# Patient Record
Sex: Female | Born: 1970 | Race: White | Hispanic: No | Marital: Single | Smoking: Never smoker
Health system: Southern US, Community
[De-identification: ages and names within clinical notes are randomized; demographics above are authoritative.]

## PROBLEM LIST (undated history)

## (undated) DIAGNOSIS — E785 Hyperlipidemia, unspecified: Secondary | ICD-10-CM

## (undated) DIAGNOSIS — J45909 Unspecified asthma, uncomplicated: Secondary | ICD-10-CM

## (undated) DIAGNOSIS — M419 Scoliosis, unspecified: Secondary | ICD-10-CM

## (undated) DIAGNOSIS — G4730 Sleep apnea, unspecified: Secondary | ICD-10-CM

## (undated) DIAGNOSIS — R55 Syncope and collapse: Secondary | ICD-10-CM

## (undated) DIAGNOSIS — R609 Edema, unspecified: Secondary | ICD-10-CM

## (undated) DIAGNOSIS — R945 Abnormal results of liver function studies: Secondary | ICD-10-CM

## (undated) DIAGNOSIS — T7840XA Allergy, unspecified, initial encounter: Secondary | ICD-10-CM

## (undated) DIAGNOSIS — M797 Fibromyalgia: Secondary | ICD-10-CM

## (undated) DIAGNOSIS — R7989 Other specified abnormal findings of blood chemistry: Secondary | ICD-10-CM

## (undated) DIAGNOSIS — E119 Type 2 diabetes mellitus without complications: Secondary | ICD-10-CM

## (undated) DIAGNOSIS — E039 Hypothyroidism, unspecified: Secondary | ICD-10-CM

## (undated) DIAGNOSIS — R112 Nausea with vomiting, unspecified: Secondary | ICD-10-CM

## (undated) DIAGNOSIS — F419 Anxiety disorder, unspecified: Secondary | ICD-10-CM

## (undated) DIAGNOSIS — Z9889 Other specified postprocedural states: Secondary | ICD-10-CM

## (undated) DIAGNOSIS — R51 Headache: Secondary | ICD-10-CM

## (undated) DIAGNOSIS — J40 Bronchitis, not specified as acute or chronic: Secondary | ICD-10-CM

## (undated) DIAGNOSIS — J189 Pneumonia, unspecified organism: Secondary | ICD-10-CM

## (undated) DIAGNOSIS — Z9289 Personal history of other medical treatment: Secondary | ICD-10-CM

## (undated) HISTORY — PX: ABDOMINAL HYSTERECTOMY: SHX81

## (undated) HISTORY — DX: Syncope and collapse: R55

## (undated) HISTORY — DX: Allergy, unspecified, initial encounter: T78.40XA

## (undated) HISTORY — PX: APPENDECTOMY: SHX54

## (undated) HISTORY — PX: MOUTH SURGERY: SHX715

## (undated) HISTORY — DX: Fibromyalgia: M79.7

## (undated) HISTORY — PX: NASAL SINUS SURGERY: SHX719

## (undated) HISTORY — DX: Abnormal results of liver function studies: R94.5

## (undated) HISTORY — DX: Edema, unspecified: R60.9

## (undated) HISTORY — DX: Pneumonia, unspecified organism: J18.9

## (undated) HISTORY — DX: Scoliosis, unspecified: M41.9

## (undated) HISTORY — DX: Other specified abnormal findings of blood chemistry: R79.89

## (undated) HISTORY — DX: Hyperlipidemia, unspecified: E78.5

## (undated) HISTORY — DX: Personal history of other medical treatment: Z92.89

## (undated) HISTORY — DX: Sleep apnea, unspecified: G47.30

---

## 1997-12-27 ENCOUNTER — Other Ambulatory Visit: Admission: RE | Admit: 1997-12-27 | Discharge: 1997-12-27 | Payer: Self-pay | Admitting: Family Medicine

## 1998-01-16 ENCOUNTER — Ambulatory Visit (HOSPITAL_COMMUNITY): Admission: RE | Admit: 1998-01-16 | Discharge: 1998-01-16 | Payer: Self-pay | Admitting: Family Medicine

## 1998-01-17 ENCOUNTER — Ambulatory Visit (HOSPITAL_COMMUNITY): Admission: RE | Admit: 1998-01-17 | Discharge: 1998-01-17 | Payer: Self-pay | Admitting: Family Medicine

## 1998-01-30 ENCOUNTER — Other Ambulatory Visit: Admission: RE | Admit: 1998-01-30 | Discharge: 1998-01-30 | Payer: Self-pay | Admitting: Family Medicine

## 1998-02-05 ENCOUNTER — Ambulatory Visit (HOSPITAL_COMMUNITY): Admission: RE | Admit: 1998-02-05 | Discharge: 1998-02-05 | Payer: Self-pay | Admitting: Family Medicine

## 1998-02-06 ENCOUNTER — Ambulatory Visit (HOSPITAL_COMMUNITY): Admission: RE | Admit: 1998-02-06 | Discharge: 1998-02-06 | Payer: Self-pay | Admitting: Family Medicine

## 1998-04-03 ENCOUNTER — Other Ambulatory Visit: Admission: RE | Admit: 1998-04-03 | Discharge: 1998-04-03 | Payer: Self-pay | Admitting: Family Medicine

## 2000-06-17 ENCOUNTER — Encounter: Payer: Self-pay | Admitting: Family Medicine

## 2000-06-17 ENCOUNTER — Encounter: Admission: RE | Admit: 2000-06-17 | Discharge: 2000-06-17 | Payer: Self-pay | Admitting: Family Medicine

## 2000-09-08 ENCOUNTER — Emergency Department (HOSPITAL_COMMUNITY): Admission: EM | Admit: 2000-09-08 | Discharge: 2000-09-08 | Payer: Self-pay | Admitting: Emergency Medicine

## 2001-03-15 ENCOUNTER — Ambulatory Visit (HOSPITAL_BASED_OUTPATIENT_CLINIC_OR_DEPARTMENT_OTHER): Admission: RE | Admit: 2001-03-15 | Discharge: 2001-03-15 | Payer: Self-pay | Admitting: Oral Surgery

## 2001-03-23 ENCOUNTER — Encounter (INDEPENDENT_AMBULATORY_CARE_PROVIDER_SITE_OTHER): Payer: Self-pay | Admitting: *Deleted

## 2001-03-24 ENCOUNTER — Inpatient Hospital Stay (HOSPITAL_COMMUNITY): Admission: EM | Admit: 2001-03-24 | Discharge: 2001-04-01 | Payer: Self-pay | Admitting: Emergency Medicine

## 2001-03-24 ENCOUNTER — Encounter: Payer: Self-pay | Admitting: Surgery

## 2001-03-26 ENCOUNTER — Encounter: Payer: Self-pay | Admitting: Surgery

## 2001-03-29 ENCOUNTER — Encounter: Payer: Self-pay | Admitting: Surgery

## 2001-04-07 ENCOUNTER — Encounter: Payer: Self-pay | Admitting: Surgery

## 2001-04-07 ENCOUNTER — Encounter: Payer: Self-pay | Admitting: Emergency Medicine

## 2001-04-07 ENCOUNTER — Inpatient Hospital Stay (HOSPITAL_COMMUNITY): Admission: EM | Admit: 2001-04-07 | Discharge: 2001-04-12 | Payer: Self-pay | Admitting: Emergency Medicine

## 2001-04-10 ENCOUNTER — Encounter: Payer: Self-pay | Admitting: Internal Medicine

## 2001-04-12 ENCOUNTER — Encounter: Payer: Self-pay | Admitting: Surgery

## 2002-05-08 ENCOUNTER — Emergency Department (HOSPITAL_COMMUNITY): Admission: EM | Admit: 2002-05-08 | Discharge: 2002-05-08 | Payer: Self-pay

## 2002-05-08 ENCOUNTER — Encounter: Payer: Self-pay | Admitting: Emergency Medicine

## 2002-05-26 ENCOUNTER — Encounter: Admission: RE | Admit: 2002-05-26 | Discharge: 2002-05-26 | Payer: Self-pay | Admitting: Family Medicine

## 2002-05-26 ENCOUNTER — Encounter: Payer: Self-pay | Admitting: Family Medicine

## 2002-06-02 ENCOUNTER — Encounter: Payer: Self-pay | Admitting: Family Medicine

## 2002-06-02 ENCOUNTER — Ambulatory Visit (HOSPITAL_COMMUNITY): Admission: RE | Admit: 2002-06-02 | Discharge: 2002-06-02 | Payer: Self-pay | Admitting: Family Medicine

## 2003-06-11 ENCOUNTER — Ambulatory Visit (HOSPITAL_COMMUNITY): Admission: RE | Admit: 2003-06-11 | Discharge: 2003-06-11 | Payer: Self-pay | Admitting: Otolaryngology

## 2003-06-11 ENCOUNTER — Encounter (INDEPENDENT_AMBULATORY_CARE_PROVIDER_SITE_OTHER): Payer: Self-pay | Admitting: *Deleted

## 2003-06-11 ENCOUNTER — Ambulatory Visit (HOSPITAL_BASED_OUTPATIENT_CLINIC_OR_DEPARTMENT_OTHER): Admission: RE | Admit: 2003-06-11 | Discharge: 2003-06-11 | Payer: Self-pay | Admitting: Otolaryngology

## 2004-02-12 ENCOUNTER — Ambulatory Visit (HOSPITAL_COMMUNITY): Admission: RE | Admit: 2004-02-12 | Discharge: 2004-02-12 | Payer: Self-pay | Admitting: Otolaryngology

## 2004-02-12 ENCOUNTER — Ambulatory Visit (HOSPITAL_BASED_OUTPATIENT_CLINIC_OR_DEPARTMENT_OTHER): Admission: RE | Admit: 2004-02-12 | Discharge: 2004-02-12 | Payer: Self-pay | Admitting: Otolaryngology

## 2004-02-12 ENCOUNTER — Encounter (INDEPENDENT_AMBULATORY_CARE_PROVIDER_SITE_OTHER): Payer: Self-pay | Admitting: Specialist

## 2004-08-05 ENCOUNTER — Other Ambulatory Visit: Admission: RE | Admit: 2004-08-05 | Discharge: 2004-08-05 | Payer: Self-pay | Admitting: Obstetrics and Gynecology

## 2004-08-08 ENCOUNTER — Encounter: Admission: RE | Admit: 2004-08-08 | Discharge: 2004-08-08 | Payer: Self-pay | Admitting: Gastroenterology

## 2004-09-10 ENCOUNTER — Emergency Department (HOSPITAL_COMMUNITY): Admission: EM | Admit: 2004-09-10 | Discharge: 2004-09-10 | Payer: Self-pay | Admitting: Emergency Medicine

## 2004-11-14 ENCOUNTER — Ambulatory Visit (HOSPITAL_COMMUNITY): Admission: RE | Admit: 2004-11-14 | Discharge: 2004-11-14 | Payer: Self-pay | Admitting: Urology

## 2005-03-02 ENCOUNTER — Inpatient Hospital Stay (HOSPITAL_BASED_OUTPATIENT_CLINIC_OR_DEPARTMENT_OTHER): Admission: RE | Admit: 2005-03-02 | Discharge: 2005-03-02 | Payer: Self-pay | Admitting: Cardiology

## 2005-09-18 ENCOUNTER — Other Ambulatory Visit: Admission: RE | Admit: 2005-09-18 | Discharge: 2005-09-18 | Payer: Self-pay | Admitting: Obstetrics and Gynecology

## 2006-03-30 ENCOUNTER — Encounter: Admission: RE | Admit: 2006-03-30 | Discharge: 2006-03-30 | Payer: Self-pay | Admitting: Obstetrics and Gynecology

## 2006-04-06 ENCOUNTER — Encounter: Admission: RE | Admit: 2006-04-06 | Discharge: 2006-04-06 | Payer: Self-pay | Admitting: Obstetrics and Gynecology

## 2006-09-27 ENCOUNTER — Encounter: Admission: RE | Admit: 2006-09-27 | Discharge: 2006-09-27 | Payer: Self-pay | Admitting: Obstetrics and Gynecology

## 2007-04-04 ENCOUNTER — Encounter: Admission: RE | Admit: 2007-04-04 | Discharge: 2007-04-04 | Payer: Self-pay | Admitting: Obstetrics and Gynecology

## 2007-11-08 ENCOUNTER — Encounter: Admission: RE | Admit: 2007-11-08 | Discharge: 2007-11-08 | Payer: Self-pay | Admitting: Obstetrics and Gynecology

## 2008-04-05 ENCOUNTER — Other Ambulatory Visit: Admission: RE | Admit: 2008-04-05 | Discharge: 2008-04-05 | Payer: Self-pay | Admitting: Obstetrics & Gynecology

## 2008-04-16 ENCOUNTER — Other Ambulatory Visit: Admission: RE | Admit: 2008-04-16 | Discharge: 2008-04-16 | Payer: Self-pay | Admitting: Obstetrics and Gynecology

## 2009-05-14 ENCOUNTER — Emergency Department (HOSPITAL_COMMUNITY): Admission: EM | Admit: 2009-05-14 | Discharge: 2009-05-15 | Payer: Self-pay | Admitting: Emergency Medicine

## 2009-08-06 ENCOUNTER — Encounter: Admission: RE | Admit: 2009-08-06 | Discharge: 2009-08-06 | Payer: Self-pay | Admitting: Family Medicine

## 2009-08-07 ENCOUNTER — Emergency Department (HOSPITAL_COMMUNITY): Admission: EM | Admit: 2009-08-07 | Discharge: 2009-08-07 | Payer: Self-pay | Admitting: Emergency Medicine

## 2009-08-14 ENCOUNTER — Ambulatory Visit: Payer: Self-pay | Admitting: Vascular Surgery

## 2009-09-10 ENCOUNTER — Emergency Department (HOSPITAL_COMMUNITY): Admission: EM | Admit: 2009-09-10 | Discharge: 2009-09-10 | Payer: Self-pay | Admitting: Emergency Medicine

## 2010-08-25 ENCOUNTER — Emergency Department (HOSPITAL_COMMUNITY)
Admission: EM | Admit: 2010-08-25 | Discharge: 2010-08-25 | Payer: Self-pay | Source: Home / Self Care | Admitting: Emergency Medicine

## 2010-09-27 ENCOUNTER — Other Ambulatory Visit: Payer: Self-pay | Admitting: Obstetrics & Gynecology

## 2010-09-27 DIAGNOSIS — Z1239 Encounter for other screening for malignant neoplasm of breast: Secondary | ICD-10-CM

## 2010-09-28 ENCOUNTER — Encounter: Payer: Self-pay | Admitting: Obstetrics and Gynecology

## 2010-09-28 ENCOUNTER — Encounter: Payer: Self-pay | Admitting: Family Medicine

## 2010-11-06 ENCOUNTER — Ambulatory Visit
Admission: RE | Admit: 2010-11-06 | Discharge: 2010-11-06 | Disposition: A | Discharge status: Home / Self Care | Payer: BC Managed Care – PPO | Source: Ambulatory Visit | Attending: Obstetrics & Gynecology | Admitting: Obstetrics & Gynecology

## 2010-11-06 DIAGNOSIS — Z1239 Encounter for other screening for malignant neoplasm of breast: Secondary | ICD-10-CM

## 2010-12-09 LAB — POCT I-STAT, CHEM 8
BUN: 12 mg/dL (ref 6–23)
Calcium, Ion: 1.22 mmol/L (ref 1.12–1.32)
Glucose, Bld: 134 mg/dL — ABNORMAL HIGH (ref 70–99)
HCT: 43 % (ref 36.0–46.0)
Hemoglobin: 14.6 g/dL (ref 12.0–15.0)
Potassium: 4.1 mEq/L (ref 3.5–5.1)
TCO2: 26 mmol/L (ref 0–100)

## 2010-12-12 LAB — D-DIMER, QUANTITATIVE: D-Dimer, Quant: <0.22 ug/mL-FEU (ref 0.00–0.48)

## 2010-12-25 ENCOUNTER — Other Ambulatory Visit: Payer: Self-pay | Admitting: Gastroenterology

## 2010-12-29 ENCOUNTER — Ambulatory Visit
Admission: RE | Admit: 2010-12-29 | Discharge: 2010-12-29 | Disposition: A | Discharge status: Home / Self Care | Payer: BC Managed Care – PPO | Source: Ambulatory Visit | Attending: Gastroenterology | Admitting: Gastroenterology

## 2011-01-01 ENCOUNTER — Emergency Department (HOSPITAL_COMMUNITY)
Admission: EM | Admit: 2011-01-01 | Discharge: 2011-01-01 | Disposition: A | Discharge status: Home / Self Care | Payer: BC Managed Care – PPO | Attending: Emergency Medicine | Admitting: Emergency Medicine

## 2011-01-01 ENCOUNTER — Emergency Department (HOSPITAL_COMMUNITY): Payer: BC Managed Care – PPO

## 2011-01-01 DIAGNOSIS — R748 Abnormal levels of other serum enzymes: Secondary | ICD-10-CM | POA: Insufficient documentation

## 2011-01-01 DIAGNOSIS — D259 Leiomyoma of uterus, unspecified: Secondary | ICD-10-CM | POA: Insufficient documentation

## 2011-01-01 DIAGNOSIS — R1011 Right upper quadrant pain: Secondary | ICD-10-CM | POA: Insufficient documentation

## 2011-01-01 LAB — COMPREHENSIVE METABOLIC PANEL
ALT: 52 U/L — ABNORMAL HIGH (ref 0–35)
Albumin: 4.1 g/dL (ref 3.5–5.2)
Alkaline Phosphatase: 72 U/L (ref 39–117)
Chloride: 105 mEq/L (ref 96–112)
Creatinine, Ser: 0.75 mg/dL (ref 0.4–1.2)
GFR calc non Af Amer: >60 mL/min (ref >60)
Glucose, Bld: 103 mg/dL — ABNORMAL HIGH (ref 70–99)
Potassium: 3.7 mEq/L (ref 3.5–5.1)
Total Bilirubin: 1.3 mg/dL — ABNORMAL HIGH (ref 0.3–1.2)
Total Protein: 7.4 g/dL (ref 6.0–8.3)

## 2011-01-01 LAB — URINALYSIS, ROUTINE W REFLEX MICROSCOPIC
Bilirubin Urine: NEGATIVE
Glucose, UA: NEGATIVE mg/dL
Hgb urine dipstick: NEGATIVE
Ketones, ur: NEGATIVE mg/dL
Nitrite: NEGATIVE
Protein, ur: NEGATIVE mg/dL
Specific Gravity, Urine: 1.023 (ref 1.005–1.030)

## 2011-01-01 LAB — DIFFERENTIAL
Basophils Absolute: 0 10*3/uL (ref 0.0–0.1)
Eosinophils Absolute: 0.1 10*3/uL (ref 0.0–0.7)
Monocytes Relative: 5 % (ref 3–12)
Neutro Abs: 3.6 10*3/uL (ref 1.7–7.7)

## 2011-01-01 LAB — CBC
Hemoglobin: 12.5 g/dL (ref 12.0–15.0)
MCH: 31.9 pg (ref 26.0–34.0)
Platelets: 250 10*3/uL (ref 150–400)
RBC: 3.92 MIL/uL (ref 3.87–5.11)

## 2011-01-01 LAB — POCT PREGNANCY, URINE: Preg Test, Ur: NEGATIVE

## 2011-01-01 MED ORDER — IOHEXOL 300 MG/ML  SOLN
100.0000 mL | Freq: Once | INTRAMUSCULAR | Status: AC | PRN
  Administered 2011-01-01: 100 mL via INTRAVENOUS

## 2011-01-09 ENCOUNTER — Other Ambulatory Visit (HOSPITAL_COMMUNITY): Payer: Self-pay | Admitting: Gastroenterology

## 2011-01-12 ENCOUNTER — Emergency Department (HOSPITAL_COMMUNITY)
Admission: EM | Admit: 2011-01-12 | Discharge: 2011-01-12 | Disposition: A | Discharge status: Home / Self Care | Payer: BC Managed Care – PPO | Attending: Emergency Medicine | Admitting: Emergency Medicine

## 2011-01-12 ENCOUNTER — Emergency Department (HOSPITAL_COMMUNITY): Payer: BC Managed Care – PPO

## 2011-01-12 DIAGNOSIS — R112 Nausea with vomiting, unspecified: Secondary | ICD-10-CM | POA: Insufficient documentation

## 2011-01-12 DIAGNOSIS — N390 Urinary tract infection, site not specified: Secondary | ICD-10-CM | POA: Insufficient documentation

## 2011-01-12 DIAGNOSIS — R0602 Shortness of breath: Secondary | ICD-10-CM | POA: Insufficient documentation

## 2011-01-12 DIAGNOSIS — R109 Unspecified abdominal pain: Secondary | ICD-10-CM | POA: Insufficient documentation

## 2011-01-12 LAB — URINALYSIS, ROUTINE W REFLEX MICROSCOPIC
Bilirubin Urine: NEGATIVE
Protein, ur: NEGATIVE mg/dL
Urobilinogen, UA: 0.2 mg/dL (ref 0.0–1.0)
pH: 6 (ref 5.0–8.0)

## 2011-01-12 LAB — DIFFERENTIAL
Basophils Absolute: 0 10*3/uL (ref 0.0–0.1)
Lymphocytes Relative: 29 % (ref 12–46)
Lymphs Abs: 2 10*3/uL (ref 0.7–4.0)
Monocytes Absolute: 0.3 10*3/uL (ref 0.1–1.0)
Monocytes Relative: 5 % (ref 3–12)
Neutro Abs: 4.5 10*3/uL (ref 1.7–7.7)

## 2011-01-12 LAB — COMPREHENSIVE METABOLIC PANEL
Alkaline Phosphatase: 72 U/L (ref 39–117)
BUN: 11 mg/dL (ref 6–23)
Chloride: 100 mEq/L (ref 96–112)
Glucose, Bld: 77 mg/dL (ref 70–99)
Potassium: 3.5 mEq/L (ref 3.5–5.1)
Total Bilirubin: 1.5 mg/dL — ABNORMAL HIGH (ref 0.3–1.2)
Total Protein: 7.3 g/dL (ref 6.0–8.3)

## 2011-01-12 LAB — CBC
HCT: 36 % (ref 36.0–46.0)
Hemoglobin: 13 g/dL (ref 12.0–15.0)
MCHC: 36.1 g/dL — ABNORMAL HIGH (ref 30.0–36.0)
MCV: 90.5 fL (ref 78.0–100.0)

## 2011-01-12 LAB — POCT I-STAT, CHEM 8
Calcium, Ion: 1.05 mmol/L — ABNORMAL LOW (ref 1.12–1.32)
Creatinine, Ser: 0.8 mg/dL (ref 0.4–1.2)
Glucose, Bld: 77 mg/dL (ref 70–99)
Hemoglobin: 11.9 g/dL — ABNORMAL LOW (ref 12.0–15.0)
Potassium: 3.5 mEq/L (ref 3.5–5.1)

## 2011-01-12 LAB — LIPASE, BLOOD: Lipase: 12 U/L (ref 11–59)

## 2011-01-12 LAB — RAPID URINE DRUG SCREEN, HOSP PERFORMED: Barbiturates: NOT DETECTED

## 2011-01-14 LAB — URINE CULTURE: Culture  Setup Time: 201205080250

## 2011-01-20 ENCOUNTER — Encounter (HOSPITAL_COMMUNITY)
Admission: RE | Admit: 2011-01-20 | Discharge: 2011-01-20 | Disposition: A | Discharge status: Home / Self Care | Payer: BC Managed Care – PPO | Source: Ambulatory Visit | Attending: Gastroenterology | Admitting: Gastroenterology

## 2011-01-20 ENCOUNTER — Other Ambulatory Visit (HOSPITAL_COMMUNITY): Payer: Self-pay | Admitting: Gastroenterology

## 2011-01-20 DIAGNOSIS — R1011 Right upper quadrant pain: Secondary | ICD-10-CM

## 2011-01-20 DIAGNOSIS — K3189 Other diseases of stomach and duodenum: Secondary | ICD-10-CM | POA: Insufficient documentation

## 2011-01-20 DIAGNOSIS — R1013 Epigastric pain: Secondary | ICD-10-CM | POA: Insufficient documentation

## 2011-01-20 MED ORDER — TECHNETIUM TC 99M SULFUR COLLOID
2.0000 | Freq: Once | INTRAVENOUS | Status: AC | PRN
  Administered 2011-01-20: 2 via INTRAVENOUS

## 2011-01-20 NOTE — Procedures (Signed)
CAROTID DUPLEX EXAM   INDICATION:  Carotid pain.   HISTORY:  Diabetes:  No.  Cardiac:  No.  Hypertension:  No.  Smoking:  No.  Previous Surgery:  No.  CV History:  No.  Amaurosis Fugax No, Paresthesias No, Hemiparesis No.                                       RIGHT             LEFT  Brachial systolic pressure:         120               100  Brachial Doppler waveforms:         Within normal limits                Within normal limits  Vertebral direction of flow:        Antegrade         Antegrade  DUPLEX VELOCITIES (cm/sec)  CCA peak systolic                   119               144  ECA peak systolic                   104               131  ICA peak systolic                   90                106  ICA end diastolic                   41                33  PLAQUE MORPHOLOGY:                  Heterogenous      Heterogenous  PLAQUE AMOUNT:                      Mild              Mild  PLAQUE LOCATION:                    ECA               ICA, ECA    IMPRESSION:  1. The right and left internal carotid arteries suggest 20-39%      stenosis.  2. Bilateral vertebrals appear antegrade.   ___________________________________________  Janetta Hora. Fields, MD   CB/MEDQ  D:  08/14/2009  T:  08/14/2009  Job:  [id number]

## 2011-01-23 NOTE — Discharge Summary (Signed)
[facility]  Patient:    Destiny Sharp, Destiny Sharp                      MRN: [id number] Adm. Date:  [id number] Disc. Date: [id number] Attending:  Anastasio Auerbach CC:         Tammy R. Collins Scotland, M.D.  Currie Paris, M.D.  Jodi Marble. Fredia Sorrow, M.D.   Discharge Summary  DATE OF BIRTH:  2071/03/30  DISCHARGE DIAGNOSES:  1. Left lower lobe pneumonia with large effusion.     a. Therapeutic/diagnostic thoracentesis, April 07, 2001.     b. 9800 white blood cells in fluid, culture negative.  2. Hypoxia secondary to left lower lobe pneumonia, resolved.  3. Persistent atelectasis.     a. Recent abdominal surgery.  4. Ruptured appendix on March 24, 2001.     a. Postoperative atelectasis with hypoxia.  5. Mild elevated liver tests, resolved.  6. Normocytic anemia.     a. Iron deficiency:  Iron 27, total iron binding capacity 211, percent        saturation 13.  7. Back pain, resolved.  8. Transient diarrhea.     a. Clostridium difficile toxin negative.  9. History of cardiogenic syncope. 10. History of migraine headaches. 11. History of hyperlipidemia.  ALLERGIES:  CODEINE.  DISCHARGE MEDICATIONS: 1. (New) Tequin 400 mg q.d. through April 21, 2001 (total 14-day therapy). 2. (New) Flagyl 500 mg one p.o. t.i.d. through April 21, 2001. 3. (New) Humibid LA 600 mg two p.o. b.i.d. through April 21, 2001. 4. (New) Albuterol MDI with spacer two puffs q.i.d. through April 21, 2001. 5. Nadolol 20 mg daily. 6. Depakote as before if needed for migraines. 7. (New) Percocet half to one tablet q.6h. p.r.n. severe pain, otherwise use    Tylenol, dispense #10 with no refills.  CONDITION UPON DISCHARGE:  Stable.  DISPOSITION:  Home with family.  RECOMMENDED ACTIVITY:  As tolerated.  I have advised the patient not to return to work until she follows up with Currie Paris, M.D., on April 20, 2001.  I forgot to write her this note at the time of discharge.  In follow-up with  Tammy R. Collins Scotland, M.D., maybe she would be willing to write for that.  If not, I will be happy to leave one on the floor and she can return for it.  RECOMMENDED DIET:  As tolerated.  Small frequent meals.  SPECIAL INSTRUCTIONS:  Return if symptoms recur.  FOLLOW-UP: 1. Tammy R. Collins Scotland, M.D., on Thursday, April 14, 2001, at 4:10 p.m.  Consider    getting baseline outpatient x-ray.  Also if possible, write her an excuse    for work as I forgot to do that prior to her discharge.  Would recommend    repeat x-ray in approximately four to six weeks to ensure clearing in    follow-up. 2. Janine Limbo, M.D., on Wednesday, April 20, 2001, at 2:30 p.m.  She    was originally scheduled to be seen on April 15, 2001, but I feel like we    should bump that back given her hospital stay.  CONSULTANTS: 1. Anastasio Auerbach, M.D.  Currie Paris, M.D., originally admitted this    patient and was consulting.  Service transfer was arranged during the    admission. 2. Sherrine Maples T. Fredia Sorrow, M.D.  PROCEDURES: 1. Left ultrasound-guided thoracentesis:  750 ml of fluid removed.  Studies    showed LDH 387, glucose 95, WBC 9800 with 76% segs,  pH 7.37, and cultures    negative. 2. Serial chest x-rays:  Initially she had a large left pleural effusion.    This resolved post thoracentesis.  I followed x-rays up until April 09, 2001.  I did repeat one at the time of discharge which was read as possibly    some slight pulmonary edema with persistent atelectasis.  The effusion had    not returned.  I reviewed with the radiologist and he felt like there was    some potentially improved clearing at the left base because we could see    the diaphragm.  He also questioned a small pneumothorax likely related to    the tap.  This was not seen on the previous films, but she was not    inspiring as well on those.  She was clinically stable as detailed below.  HOSPITAL COURSE: #1 - LEFT LOWER LOBE PNEUMONIA WITH LARGE  EFFUSION:  Destiny Sharp is a 40 year old Caucasian female who was hospitalized on March 24, 2001, with a ruptured appendix.  Prior to that, she had been fairly healthy.  She underwent emergency surgery and postoperatively had some significant atelectasis resulting in hypoxia.  At the time of discharge from that acute hospital stay, her saturations were normal on room air.  She was home for approximately one week and returned to the emergency room with marked shortness of breath.  Over that time, she was actually on Augmentin status post appendix rupture.  In the emergency room, she was noted to have a large left effusion.  Because she was markedly dyspneic, Currie Paris, M.D., admitted her to the step-down unit.  The following morning, she underwent ultrasound-guided thoracentesis. There were 750 ml of fluid removed and her symptoms were markedly improved.  I changed her antibiotics to Zosyn given the recent hospitalization.  Her temperature on admission was 102 degrees and she quickly defervesced. Twenty-four hours prior to discharge, I did place her on oral Tequin and Flagyl.  I did not use Augmentin for the simple fact that she had been on this previously and still developed the pneumonia.  During her stay, we had difficulty getting her to deep breathe and use her nebulizers or metered dose inhalers.  She was getting better with incentive spirometry and I think the atelectasis was improving and certainly her oxygenation was.  I sent her home with a flutter valve.  Again, as I stated above, I did review the discharge chest x-ray.  I was concerned that maybe it had not improved and maybe had worsened slightly, but certainly her clinical picture was improved and I felt comfort sending her home.  She will have close follow-up with her primary physician, Tammy R. Collins Scotland, M.D., and she knows to return to the emergency room  if she has problems.  She will complete a total of 14 days of  antibiotic therapy.  I did feel like she should be out of work for at least a week and a half following this pneumonia for recovery.  #2 - IRON DEFICIENCY ANEMIA:  Destiny Sharp had a discharge hemoglobin after her surgery of 9.1.  It has remained relatively stable.  I did do iron studies which did detect a deficiency.  Her reticulocyte count is inappropriately low. I did not place her on iron at the time of discharge because she was having difficulty with nausea with the antibiotics.  Once the antibiotics are complete, I would consider putting her on iron therapy and  following her hemoglobin.  #3 - RECENT APPENDECTOMY:  Destiny Sharp had a ruptured appendix and underwent surgery as I mentioned above.  She was asking me how long she should stay out of work.  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  I have offered to keep her out of work through Wednesday, April 20, 2001.  After that I would defer to Dr. Eligha Bridegroom opinion about her ability to drive the bus based on the surgical intervention.  The patient understands this and will follow up with him at the new scheduled appointment that I made for her.  DISCHARGE LABORATORY DATA:  Hemoglobin 8.3-9.0, WBC 4300, platelet count 5111. Sodium 139, potassium 3.4, chloride 105, bicarbonate 28, BUN 9, creatinine 0.8, glucose 108, total bilirubin 0.9, alkaline phosphatase 182, SGOT 47, SGPT 91, albumin 2.4.  I spent greater than 30 minutes arranging discharge and dictating. DD:  04/12/01 TD:  04/12/01 Job: [id number]

## 2011-01-23 NOTE — Op Note (Signed)
Destiny Sharp, Destiny Sharp                         ACCOUNT NO.:  [id number]   MEDICAL RECORD NO.:  [id number]                   PATIENT TYPE:  AMB   LOCATION:  [facility]                                  FACILITY:  [facility]   PHYSICIAN:  Kinnie Scales. Annalee Genta, M.D.            DATE OF BIRTH:  1971-08-31   DATE OF PROCEDURE:  06/11/2003  DATE OF DISCHARGE:                                 OPERATIVE REPORT   PREOPERATIVE DIAGNOSES:  1. Chronic sinusitis.  2. Nasal septal deviation with nasal airway obstruction.  3. Bilateral inferior turbinate hypertrophy.   POSTOPERATIVE DIAGNOSES:  1. Chronic sinusitis.  2. Nasal septal deviation with nasal airway obstruction.  3. Bilateral inferior turbinate hypertrophy.   INDICATION FOR PROCEDURE:  1. Chronic sinusitis.  2. Nasal septal deviation with nasal airway obstruction.  3. Bilateral inferior turbinate hypertrophy.   SURGICAL PROCEDURES:  1. Bilateral endoscopic sinus surgery with InstaTrak computer-assisted     navigation, sinus surgery consisting of bilateral nasofrontal recess     exploration, total ethmoidectomy, and maxillary antrostomy, with removal     of diseased maxillary sinus tissue.  2. Nasal septoplasty.  3. Bilateral inferior turbinate intramural cautery.   ANESTHESIA:  General endotracheal.   SURGEON:  Kinnie Scales. Annalee Genta, M.D.   COMPLICATIONS:  None.   ESTIMATED BLOOD LOSS:  Approximately 150 mL.   There were no complications.  The patient was transferred from the operating  room to the recovery room in stable condition.   BRIEF HISTORY:  The patient is a 40 year old white female who was referred  by her primary care physician, Tammy R. Collins Scotland, M.D., for evaluation of  chronic sinusitis.  The patient had been treated with numerous courses of  antibiotics and prior CT scanning showed significant opacification of the  maxillary and ethmoid sinuses bilaterally.  The patient complained of severe  headaches, sino-nasal discharge,  and congestion and pressure.  She was found  to have a severely deviated nasal septum, turbinate hypertrophy, and  opacification of the maxillary and frontal sinuses.  She was treated with  several additional courses of antibiotics and despite this therapy, failed  to resolve her ongoing infections.  A CT scan with the InstaTrak was  obtained prior to surgical counseling and the risks, benefits, and possible  complications of the above surgical procedures were discussed in detail with  the patient and her family, who understood and concurred with our plan for  surgery, which is scheduled for June 11, 2003.   OPERATIVE PROCEDURE:  The patient was brought to the operating room on  June 11, 2003, and placed in the supine position on the operating table.  General endotracheal anesthesia was established without difficulty and the  patient was adequately anesthetized.  Her nose was injected with 12 mL of 1%  lidocaine and 1:100,000 solution of epinephrine injected in a submucosal  fashion along the nasal septum, inferior turbinates, lateral nasal wall, and  middle turbinates bilaterally.  The patient's nose was then packed with  Afrin-soaked cottonoid pledgets, which were left in place for approximately  10 minutes to allow for vasoconstriction.  The surgical procedure was begun  with removal of the pledgets and bilateral nasal endoscopy.  Prior to the  beginning of the procedure, the InstaTrak computer-assisted navigation  headgear was placed and anatomic and surgical landmarks were identified and  confirmed.  The patient was prepped and draped in a sterile fashion.  With  the 0 degree telescope, bilateral endoscopy was performed.  On the patient's  right-hand side the nasal cavity was relatively patent.  She had turbinate  hypertrophy and moderate mucosal edema.  The middle meatus showed thick  mucopurulent material and polypoid changes with obstruction.  On the  patient's left-hand side  the patient had a severely deviated nasal septum  with septal spurring and impingement on the middle meatus and lateral nasal  wall.  I was unable to visualize the middle meatus.  The surgical procedure  was then undertaken with right endoscopic sinus surgery.  By using a 0  degree telescope, the patient's middle turbinate was gently medialized.  Polypoid material within the middle meatus was then resected with a straight  microdebrider.  The uncinate process was reflected medially and was resected  in its entirety from inferior to superior.  Dissection was then carried out  through the anterior ethmoid region, removing the ethmoid bulla and diseased  mucosa and purulent soft tissue.  Dissection was carried out from anterior  to posterior along the floor of the ethmoid sinus, removing bony septations.  The posterior aspects of the ethmoid sinus were identified and the roof of  the ethmoid sinus was confirmed with the InstaTrak.  Dissection was then  carried from posterior to anterior along the roof of the ethmoid using a 30  degree telescope, a curved microdebrider, and through-cutting forceps.  The  nasofrontal recess region was identified.  It was completely occluded with  diseased mucosa and polypoid material.  Using a through-cutting frontal  sinus punch and curved microdebrider, the natural ostium of the frontal  sinus was identified and enlarged, creating a widely patent nasofrontal  recess.  Thick mucopurulent material was then suctioned from the frontal  sinus.  The underlying mucosa appeared to be intact and the nasofrontal  recess was widely patent at the conclusion of the procedure.  Then using a  30 degree telescope, the natural ostium of the maxillary sinus was  identified and this was completely occluded with diseased mucosa, which was  resected using through-cutting forceps and a curved microdebrider.  The  right maxillary sinus was filled with thick, purulent material and  polypoid mucosa, which was resected using a curved microdebrider.  All specimens were  sent to pathology for gross and microscopic evaluation.   A right hemitransfixion incision was created through the mucosa and  underlying submucosa and a mucoperichondrial flap was elevated from anterior  to posterior along the patient's right-hand side.  The bony-cartilaginous  junction was crossed in the midline and a mucoperiosteal flap was elevated  along the patient's left-hand side.  Severely deviated bone and cartilage  from the mid- and posterior aspects of the nasal septum were removed using a  through-cutting forceps and a 4 mm osteotome with the septum brought to the  midline.  Previously-removed nasal septal cartilage was morcellized and  returned to the mucoperichondrial pocket and the mucoperichondrial flaps  reapproximated with a 4-0 gut suture on  a Keith needle in a horizontal  mattressing fashion.  At the conclusion of the surgical procedure bilateral  Doyle nasal sepal splints were sutured in position with a 3-0 Ethilon  suture.   Attention was then turned to the patient's left-hand side, where endoscopic  sinus surgery was undertaken.  The middle turbinate was medialized.  The  uncinate process was resected with a straight microdebrider.  The dissection  was carried out through the anterior ethmoid air cells, resecting the  ethmoid bulla and removing diseased mucosa and polypoid material under  direct visualization using a 0 degree telescope.  Posterior ethmoid air  cells were identified and the roof of the ethmoid sinus was confirmed with  the InstaTrak.  Dissection was carried along the roof of the ethmoid sinus  from posterior to anterior, removing diseased mucosa and significant bony  septation.  The natural ostium of the frontal sinus was identified, and this  was occluded with thickened, diseased mucosa.  Purulent material was  suctioned from the frontal sinus and the natural  ostium was enlarged with  resection of underlying ethmoid air cells.  Dissection was carried  anteriorly to create a widely patent nasofrontal recess.  Attention was then  turned to the lateral nasal wall, where residual uncinate processes were  resected and the natural ostium of the maxillary sinus was identified.  Heavy diseased mucosa was encountered, and this was resected using the  through-cutting forceps and the microdebrider.  The left maxillary sinus was  completely opacified with diseased polypoid mucosa and purulent material,  which was suctioned and debrided.   Bilateral intramural inferior turbinate cautery was undertaken using bipolar  cautery forceps.  Cautery was carried out in a submucosal fashion with two  passes made in each inferior turbinate.  When the turbinates had been  adequately cauterized, they were outfractured to create a more patent nasal  cavity.  The patient's nasal cavity was then irrigated and suctioned.  Using a 0 degree telescope, surgical debris was resected from the sinus cavities  bilaterally.  A 50-50 mix of Bactroban ointment and Kenalog 40 mg were then  instilled using a curved forceps in the frontal and maxillary sinuses  bilaterally.  Bilateral Kennedy anterior nasal sinus packs were placed  within the middle meatus and hydrated with sterile saline.  The patient's  nasal cavity and nasopharynx were then irrigated and suctioned, the oral  cavity and oropharynx were suctioned, and an orogastric tube was passed.  The patient was then awakened from her anesthetic.  She was extubated and  was transferred from the operating room to the recovery room in stable  condition.  There were no complications.  Estimated blood loss approximately  150 mL.                                                Kinnie Scales. Annalee Genta, M.D.    DLS/MEDQ  D:  45/40/9811  T:  06/12/2003  Job:  [id number]

## 2011-01-23 NOTE — Consult Note (Signed)
[facility]  Patient:    Destiny Sharp, Destiny Sharp                      MRN: [id number] Proc. Date: 03/23/01 Adm. Date:  [id number] Attending:  Charlton Haws CC:         Tammy R. Collins Scotland, M.D.   Consultation Report  CHIEF COMPLAINT:  Abdominal pain.  CLINICAL HISTORY:  Ms. Proffit is a 39 year old woman who presented to the emergency room having seen Dr. Herb Grays earlier today.  She had developed fairly severe abdominal pain yesterday which was all through her abdomen, but now localized more in the lower half of her abdomen, possibly a little bit more on the right than the left but across the entire lower abdomen.  She had a low grade fever yesterday and apparently had a 102 fever earlier today.  She had a similar episode in January, but was told that was constipation.  She has had nausea and vomiting some today.  She has felt constipated again and thought maybe this was the same thing.  The patient has had no recent urinary tract symptoms.  She is late for her menstrual period.  She had a little bit of spotting.  She is normally fairly regular.  She has never been sexually active.  PAST SURGICAL HISTORY:  She recently has had some oral surgery, has been taking pain medicine for that.  Other than that, negative.  MEDICATIONS:  Depakote for migraines, nadolol for vasovagal reactions.  For her surgery she has been on some ibuprofen, cephalexin, and Darvocet.  ALLERGIES:  CODEINE does cause some nausea.  SOCIAL HISTORY:  She does not smoke, drink, or use drugs.  FAMILY HISTORY:  Positive with a mother that has had cancer.  PHYSICAL EXAMINATION  GENERAL:  Patient is alert, oriented, appears a little uncomfortable.  VITAL SIGNS:  Temperature 99 earlier, 102 more recently.  HEENT:  Head is normocephalic.  Eyes:  Non-icteric.  Pupils are round and regular.  NECK:  Supple.  No masses or thyromegaly.  LUNGS:  Clear to auscultation.  HEART:   Regular.  No murmurs, rubs, or gallops that I can hear, although there is a questionable history of a heart murmur.  ABDOMEN:  Distended.  Fairly tender throughout, but more tender in the lower than the upper.  However, she does not really have a lot of guarding and what she does seems to be more right lower quadrant than left lower quadrant but she is tender across both quadrants.  I did not hear any bowel sounds, although bowel sounds apparently were heard by the EDP earlier.  EXTREMITIES:  No cyanosis, edema.  PELVIC:  By the EDP showed some adnexal tenderness on bimanual examination. Her hymen was noted to be intact.  RECTAL:  Unremarkable by the EDP and not repeated by me.  LABORATORIES:  Slight elevation of SGPT at 43, slight elevation total bilirubin 1.3, normal electrolytes.  White count slightly elevated at 12,000, hemoglobin 12.2.  Urine pregnancy test was negative.  Urinalysis showed specific gravity 10.2, but otherwise negative except for trace ketones.  The patient is currently drinking, at the time I saw her, contrast for her CT and was approximately a third to a half of the way through the contrast.  IMPRESSION:  Acute abdominal pain of uncertain etiology.  The examination is suspicious for a perforated appendix, although her onset of pain as being fairly sudden is somewhat atypical.  She has already started  receiving some intravenous fluid hydration and as noted pending CT scan.  Will make a final decision about this once the CT is done but at this point clinically I suspect she will need to go to the operating room. DD:  03/23/01 TD:  03/24/01 Job: [id number]

## 2011-01-23 NOTE — Consult Note (Signed)
[facility]  Patient:    Destiny Sharp, Destiny Sharp                      MRN: [id number] Proc. Date: 04/07/01 Adm. Date:  [id number] Attending:  Sandi Raveling CC:         Tammy R. Collins Scotland, M.D.  Currie Paris, M.D.   Consultation Report  DATE OF BIRTH:  February ______, 1972  REFERRING PHYSICIAN:  Dr. Currie Paris.  REASON FOR CONSULTATION:  Pleural effusion, dyspnea.  IMPRESSION: 1. Probable left lower lobe pneumonia. 2. Large left pleural effusion. 3. Hypoxia secondary to above. 4. Normocytic anemia.    a. Hemoglobin 9.5, MCV 89.    b. April 01, 2001 hemoglobin 9.1. 5. Mildly elevated liver function tests.    a. Alkaline phosphatase 200, SGOT 46, SGPT 48. 6. Ruptured appendix, March 24, 2001.    a. Status post surgery.    b. Postoperative atelectasis and hypoxia. 7. History of cardiogenic syncope. 8. History of migraine headaches. 9. Hyperlipidemia.  RECOMMENDATIONS: 1. Agree with ultrasound-guided thoracentesis (standard fluid studies). 2. Change Rocephin to Zosyn, given recent hospitalization. 3. Incentive spirometry. 4. Advance diet. 5. Transition from PCA to oral pain medications (back pain secondary to    pneumonia and effusion).  DISCUSSION:  Ms. Destiny Sharp is a 40 year old Caucasian female who was admitted on July 17th with a ruptured appendix.  Her postop course was complicated by atelectasis and hypoxia, but she was ultimately discharged home in stable condition on April 01, 2001.  After a few days at home, she began noticing increasing fevers as well as cough and shortness of breath.  She presents to the emergency room now with significant dyspnea and is noted to have a large left pleural effusion and be hypoxic on room air with a pH of 7.46, PCO2 31, PO2 55.  Initial studies in the emergency room revealed a chest x-ray showing a large left pleural effusion.  Spiral CT was done to rule out pulmonary embolus.  There was no  evidence of pulmonary embolus but she did have compressive atelectasis on the left with a large pleural effusion.  She was admitted to the intensive care unit and placed on 4 L of oxygen, which brought her PO2 up to 89.  She is complaining of pain in the mid-to-lower back region. She describes it as sharp and it is worse with inspiration or movement.  She has had no lower extremity swelling or edema.  No history of blood clots.  She currently denies headache or significant abdominal pain.  No dysuria.  PAST MEDICAL HISTORY:  See impression.  CURRENT MEDICATIONS: 1. Tylox one q.4h. p.r.n. pain. 2. Augmentin 875 mg p.o. t.i.d. 3. Nadolol 20 mg q.d.  SOCIAL HISTORY:  Ms. Methot currently lives at home with her parents.  She is a school bus driver.  She denies any history of alcohol or tobacco use.  FAMILY HISTORY:  Unremarkable.  REVIEW OF SYSTEMS:  No vision changes.  Headaches have been under reasonable control.  No anterior chest pain.  She has been tolerating a soft bland diet. Normal bowel movements.  No significant abdominal pain.  No edema.  No history of heart, liver, lung or kidney problems.  No history of thyroid disease or diabetes.  No history of stroke or seizure.  Review of systems is otherwise negative.  PHYSICAL EXAMINATION:  VITAL SIGNS:  T-max from admission was 102.2, now afebrile.  Blood pressure around 112/68.  Heart  rate 100.  Respiratory rate 20 to 30 (oxygen saturations initially on 4 L were 96%; post thoracentesis, she is on 2 L, saturating 97%). Weight 168 pounds.  HEENT:  Atraumatic.  Normocephalic.  Eyes:  Sclerae clear.  Conjunctivae pale. Mucous membranes somewhat dry.  Pharynx clear.  Cranial nerves II-XII grossly intact.  NECK:  Supple.  No adenopathy.  No mass.  No audible bruits.   LUNGS:  Decreased breath sounds two-thirds up posteriorly on the back.  E-to-A changes are present.  Right base reveals crackles.  No wheeze.  HEART:  Regular.   Normal S1 and S2.  She does have a grade 2/6 systolic ejection murmur which is likely a benign flow murmur.  No significant radiation.  ABDOMEN:  Bowel sounds present.  No significant tenderness.  No palpable mass No palpable organomegaly.  EXTREMITIES:  Warm.  Dry.  No rash.  No cyanosis.  Anicteric.  No palpable edema or cords.  NEUROLOGIC:  Patient is awake at this time; during some of the interview though, she seems to sort of dose off.  I have to wake her up.  She is on a morphine PCA.  She follows commands without difficulty.  She is oriented x 3. Motor exam reveals 5/5 strength bilaterally and sensation is grossly intact. Toes are downgoing.  Reflexes 2+ bilaterally.  Gait not assessed.  LABORATORY DATA:  Total bilirubin 0.8, alkaline phosphatase 170, SGOT 33, SGPT 40 (these were slightly elevated on admission).  Hemoglobin 9.5, MCV 89, WBC 6400, platelet count 676,000.  Admission hemoglobin was 10.6 with an MCV of 90.  WBC was 8400.  Platelets was 791,000.  Differential revealed an ANC of 6500.  Room air blood gas:  PCO2 31, PO2 55, pH 7.45.  Urine pregnancy negative.  Lipase 26.  Admission chest x-ray:  Large left pleural effusion and small right pleural effusion.  Contrasted CT shows no evidence of pulmonary embolus or DVT.  It does demonstrate the large left pleural effusion and bilateral atelectasis.  There was a small right pleural effusion.  IMPRESSION: 1. Probable left lower lobe pneumonia with large pleural effusion.  Ms. Destiny Sharp    is 40 years old and was recently in the hospital for a ruptured appendix.    She had atelectasis during that hospitalization resulting in some    significant hypoxia.  She was discharged on Augmentin but after a few days,    started to run a fever and became more short of breath.  On presentation,    she has a large pleural effusion on the left with either atelectasis or     infiltrate.  At this point, I favor infection as an etiology  for this.    Thoracentesis results are pending.  Hopefully, the thoracentesis will be    both diagnostic and therapeutic.  At this point, I would like to use Zosyn    instead of Rocephin, as it offers broader gram-negative and anaerobic    coverage, given her recent hospital stay.  I would not add vancomycin at    this time until we have cultured out or if she fails therapy.  We will try    to get a sputum culture.  She is already on nebulizers.  Would add Humibid.    I think it is reasonable to classify her as a step-down status at this    time.  We will continue to follow along closely. 2. Normocytic anemia.  Ms. Levings had a discharge hemoglobin from her  recently    hospitalization of 9.1.  This is currently 9.5 after hydration.  I suspect    it is secondary to chronic disease as well as recent illnesses.  We will    see how she does in followup and consider serum workup at that time. 3. Mildly elevated liver function tests.  This is present on admission and    likely secondary to the acute illness.  They have resolved today and we can    follow these up if indicated clinically. DD:  04/07/01 TD:  04/07/01 Job: [id number]

## 2011-01-23 NOTE — Op Note (Signed)
[facility]  Patient:    Destiny Sharp, Destiny Sharp                      MRN: [id number] Proc. Date: 03/15/01 Adm. Date:  [id number] Disc. Date: [id number] Attending:  Retia Passe                           Operative Report  PREOPERATIVE DIAGNOSES: 1. Maxillary mandibular impacted third molar teeth, #16, 17, and 32. 2. History of severe vasovagal syncope.  POSTOPERATIVE DIAGNOSES: 1. Maxillary mandibular impacted third molar teeth, #16, 17, and 32. 2. History of severe vasovagal syncope.  OPERATION/PROCEDURE:  Removal of third molar teeth, #16, 17, and 32.  SURGEON:  Vania Rea. Warren Danes, D.D.S.  FIRST ASSISTANT:  Dorthula Matas, P.A.-C.  ANESTHESIA:  General via oral endotracheal intubation.  ESTIMATED BLOOD LOSS:  Negligible.  FLUIDS REPLACED:  Approximately 1000 cc crystalloid.  COMPLICATIONS:  None apparent.  INDICATIONS FOR PROCEDURE:  Mrs. Wiltse is a 40 year old female who was referred to my office for removal of her third molar teeth.  Upon consultation, the patient stated that she had a history of severe vasovagal syncope.  Follow-up with her medical doctor revealed that she had neurological mediated vasovagal syncope and it was recommended that the procedure be performed under general anesthesia in a day surgical setting.  DESCRIPTION OF PROCEDURE:  On 03/15/01, Ms. Alcorta was taken to [facility] day surgical center where she was placed on the operating room table in the supine position.  Following successful oral endotracheal intubation and general anesthesia, the patients face, neck, and oral cavity were prepped and draped in the usual sterile operating room fashion.  The hypopharynx was suctioned free of fluids and secretions and a moistened two-inch was placed as a throat pack.  Attention was then directed into orally, where approximately 8 cc of 0.5% Xylocaine containing 1:200,000 epinephrine infiltrated in the maxillary  left posterior superior alveolar neurovascular region, to the right and left inferior alveolar neurovascular regions.  A quantity of this same solution was deposited on the palatal aspect of tooth #16.  Attention was then directed towards the maxillary arch where a #15 Bard-Parker blade was used to create a full-thickness mucoperiosteal incision overlying tooth #16.  A #9 Molt periosteal elevator was then used to reflect a full-thickness mucoperiosteal flap bilaterally and superiorly exposing the tooth.  The tooth was then subluxated from the alveolus using a 301 dental elevator and removed from the alveolus using rongeurs and cutting forceps.  The bony margins were then smoothed with a small osseus file.  The surgical site was then copiously irrigated with sterile saline irrigating solutions.  The mucoperiosteal margins were approximated and sutured in an interrupted fashion using 4-0 chromic suture material on an FS2 needle.  In a similar fashion, the third molar teeth #17 and 32 were removed as well.  The lower extraction sites were packed with Gelfoam/Mastisol pack prior to closure.  The oral cavity was suctioned free of fluids and secretions using a Yankauer tonsillar suction, the throat pack removed and the hypopharynx suctioned free of fluids and secretions as well.  The patient was allowed to awaken from the anesthesia and taken to the recovery room where she tolerated the procedure well without apparent complications. DD:  03/20/01 TD:  03/20/01 Job: [id number]

## 2011-01-23 NOTE — Discharge Summary (Signed)
[facility]  Patient:    Destiny Sharp, Destiny Sharp                      MRN: [id number] Adm. Date:  [id number] Disc. Date: [id number] Attending:  Charlton Haws CC:         Tammy R. Collins Scotland, M.D.   Discharge Summary  FINAL DIAGNOSES: 1. Acute perforated appendicitis with peritonitis. 2. Postoperative atelectasis, hypoxia and fever.  CLINICAL HISTORY:  This patient is a 40 year old woman who presented to the emergency room with abdominal pain which was along the entire lower half of her abdomen and a low grade fever. She had a similar episode in January but not associated with fever. Initially it was unclear whether this represented appendicitis or an atypical diverticulitis or some pelvic problem. A CT scan was obtained and showed a right lower quadrant inflammatory process but no definite abscess seen. The appendix was not seen but there was a fair amount of edema of the terminal ileum raising the question of Crohns.  However, on repeat exam, she was even more tender on the lower abdomen with increasing rebound and it was decided to proceed to the operating room.  In the operating room, she was found to have acute perforated appendicitis and diffuse peritonitis. The appendix was in the midline lying posterior to the uterus which was why she had the unusual presentation.  Postoperatively, she had a tachycardia for a day or two, marked abdominal distention and decreased breath sounds at the based. It was difficult to get her to get her to ambulate initially and she developed some respiratory hypoxia but this improved with good pulmonary toilet. By the third postoperative day, she started to feel a little bit better. She was maintained on postoperative antibiotics. By July 22, she had had bowel movements, passing gas and wanted to be started on liquids. We did start her on a liquid diet at that time and gradually advanced her diet. She ran a low grade  temperature the next couple of days but by the day of discharge had been afebrile for 24 hours and her white count was normal. She was tolerating oral pain medicines. She overall was felt able to go home. She was tolerating a diet and having bowel function.  LABORATORY DATA:  Culture from the perineum of growing E. coli. Urinalysis on admission showed a specific gravity of 1022 otherwise unremarkable. Electrolytes were normal. Initial hemoglobin was 12.1, white count 12,000. By the time of discharge, she had drifted down to 9.1 with a white count of 8100. Just prior to discharge, she had normal electrolytes although her albumin drifted down to 2.3 from the acute illness.  At the time of discharge, her wound was healing nicely and she had fairly clear lungs.  The patient was discharged in satisfactory condition, Tylox for pain, to continue Augmentin 875 t.i.d. for another week and to follow-up in my office in approximately two weeks. She knows to come back to the office if she is having fever or other symptoms. DD:  04/01/01 TD:  04/02/01 Job: [id number]

## 2011-01-23 NOTE — Op Note (Signed)
NAMEVONDELL, SOWELL                         ACCOUNT NO.:  [id number]   MEDICAL RECORD NO.:  [id number]                   PATIENT TYPE:  AMB   LOCATION:  DSC                                  FACILITY:  [facility]   PHYSICIAN:  Kinnie Scales. Annalee Genta, M.D.            DATE OF BIRTH:  11/10/1970   DATE OF PROCEDURE:  02/12/2004  DATE OF DISCHARGE:                                 OPERATIVE REPORT   PREOPERATIVE DIAGNOSIS:  1. Chronic sinusitis.  2. Nasal polyposis.  3. Status post bilateral endoscopic sinus surgery, nasal septoplasty, and     turbinate reduction October 2004.   POSTOPERATIVE DIAGNOSIS:  1. Chronic sinusitis.  2. Nasal polyposis.  3. Status post bilateral endoscopic sinus surgery, nasal septoplasty, and     turbinate reduction October 2004.   PROCEDURE:  Bilateral endoscopic sinus surgery with InstaTrak guidance  consisting of left revision total ethmoidectomy and maxillary antrostomy  with removal of diseased tissue and right maxillary antrostomy with removal  of diseased tissue.   ANESTHESIA:  General endotracheal anesthesia.   SURGEON:  Kinnie Scales. Annalee Genta, M.D.   ESTIMATED BLOOD LOSS:  Approximately 50 mL.   DISPOSITION:  The patient is transferred from the operating room to the  recovery room in stable condition, there were no complications.   BRIEF HISTORY:  Ms. Parillo is a 40 year old white female who has been  followed in our office with a history of chronic sinusitis.  The patient  underwent bilateral endoscopic sinus surgery, nasal septoplasty, and  turbinate reduction performed October 2004 and over the last six months, has  had a dramatic improvement in the frequency of headaches, congestion, and  recurrent infections.  Over the last six weeks, she has had an increase in  bilateral maxillary pressure and tooth pain.  She was treated with two  courses of antibiotics including Levaquin and Ketac and a follow up CT scan  after treatment showed bilateral  maxillary sinus opacification with a large  soft tissue mass consistent with polyposis obstructing the ethmoid region on  the right.  Frontal sinuses, posterior ethmoid, and sphenoid sinuses all  appeared clear and the nasal septum was midline.  There was no evidence of  bone erosion or other abnormality.  Based on the patient's history and  endoscopic examination in the office which showed left sided polypoid  disease obstructing the left common ethmoid and maxillary antrostomy, I  recommended that we consider her for revision endoscopic sinus surgery.  The  risks, benefits, and possible complications of the procedure was discussed  in detail and the patient and her parents understood and concurred with our  plan for surgery which was scheduled for February 12, 2004.   SURGICAL PROCEDURE:  The patient was brought to the operating room on February 12, 2004, and placed in supine position on the operating table. General  endotracheal anesthesia was established without difficulty.  When the  patient was adequately  anesthetized, her nose was injected with 4 mL of 1%  lidocaine with 1:100,000 epinephrine injected in a submucosal fashion along  the lateral nasal wall and middle turbinates bilaterally and within the left  sinus polyp.  The patient's nose was then packed with Afrin soaked cottonoid  pledgets which were left in place for approximately 10 minutes for  vasoconstriction and hemostasis.  The patient was prepped and draped in a  sterile fashion.  The InstaTrak head gear was applied and anatomic and  surgical landmarks were identified and confirmed.   The surgical procedure was begun with bilateral nasal endoscopy after  removal of nasal packing.  On the right hand side, the patient's nasal  cavity was widely patent.  There was a small amount of scar tissue between  the middle turbinate and the lateral nasal wall and thick mucopurulent  polypoid material within the right maxillary sinus.  On  the left hand side,  there was a large soft tissue mass consistent with a polyp filling the  entire ethmoid region originating from the lateral nasal wall at the level  of the maxillary antrostomy.  I began on the left hand side, endoscopic  sinus surgery was undertaken.  Using the 0 degree telescope and  microdebrider, the polypoid material was removed from the nasal cavity and  common ethmoid region from anterior to posterior.  The ethmoid cavity was  well healed and there was no evidence of polypoid disease or infection, no  scar tissue and the nasal frontal recess was widely patent.  A 30 degree  telescope was then used to visualize the lateral nasal wall and residual  polyp material was resected from its attachment to the maxillary antrostomy.  Using curved forceps and back cutting forceps, the maxillary antrostomy was  enlarged.  There was significant amount of  osteitic bone occluding the  maxillary ostium and this was removed under direct visualization with a 60  degree microdebrider.  A large amount of purulent material and thick,  tenacious discharge was suctioned from the left maxillary sinus and this was  sent to pathology for gross microscopic evaluation as well as analysis for  allergic fungal mucin.  The sinus was then thoroughly debrided and cleared  preserving the underlying mucosa, minimal bleeding.   Attention was turned to the patient's right side.  Using the 0 degree  telescope, a small amount of scar tissue was resected from the middle  meatus.  This allowed direct access to the common ethmoid cavity and, again,  the underlying mucosa was well healed without evidence of polyp or infection  and the nasal frontal recess was widely patent.  The right maxillary sinus  was filled with mucopurulent material which was removed under direct  visualization with a 30 and 70 degree telescope using a 60 degree  microdebrider and through cutting forceps.  The sinus was widely  patent and cleaned of debris.  Both maxillary sinuses were thoroughly irrigated with  saline solution and a 50/50 mix of Bactroban cream and Kenalog 40 was then  instilled within each maxillary sinus.  Bilateral Kennedy sinus packs were  then placed within the common ethmoid cavity.  The patient's nasal cavity  was then irrigated and suctioned.  The oral cavity and oropharynx were  suctioned.  An orogastric tube was passed and the stomach contents were  aspirated.  The patient was awakened from her anesthetic, she was extubated  and transferred from the operating room to the recovery room in stable  condition.  There were no complications.  Estimated blood loss approximately  50 mL.                                               Kinnie Scales. Annalee Genta, M.D.    DLS/MEDQ  D:  16/06/9603  T:  02/12/2004  Job:  [id number]

## 2011-01-23 NOTE — Cardiovascular Report (Signed)
NAMEYELENA, Destiny Sharp NO.:  [id number]   MEDICAL RECORD NO.:  [id number]          PATIENT TYPE:  OIB   LOCATION:  6501                         FACILITY:  [facility]   PHYSICIAN:  Peter M. Swaziland, M.D.  DATE OF BIRTH:  12/01/1970   DATE OF PROCEDURE:  03/02/2005  DATE OF DISCHARGE:                              CARDIAC CATHETERIZATION   INDICATIONS FOR PROCEDURE:  A 40 year old white female with family history  of coronary disease and history of mild hypercholesterolemia who presents  with refractory symptoms of exertional substernal chest pain and dyspnea.  Noninvasive evaluation has been unremarkable.   PROCEDURE:  Left heart catheterization, coronary and left ventricular  angiography.   EQUIPMENT USED:  4-French 3.5 cm left Judkins catheter, 4-French 4 cm right  Judkins catheter, 4-French pigtail catheter, 4-French arterial sheath   MEDICATIONS:  Local anesthesia with 1% Xylocaine and Versed 2 mg.   CONTRAST:  Omnipaque.   ACCESS:  Via the right femoral artery using standard Seldinger technique.   HEMODYNAMIC DATA:  Aortic pressure is 101/65 with a mean of 84 mmHg.  Left  ventricular pressure is 106 with an EVP of 19 mmHg.   ANGIOGRAPHIC DATA:  The left coronary arises and distributes in a dominant  fashion.  The left main coronary is normal.   The left anterior descending artery and its branches are normal.   The left circumflex coronary is a large dominant vessel and is normal.  There is a moderate sized intermediate branch which is normal.   The right coronary is a small, nondominant vessel and is normal.   The left ventricular angiography was performed in the RAO view.  This  demonstrates normal left ventricular size and contractility with normal  systolic function.  Ejection fraction is estimated at 70%. There is no  mitral regurgitation or prolapse.  The aortic root appears normal in size.   FINAL INTERPRETATION:  1.  Normal coronary anatomy with  left dominant circulation.  2.  Normal left ventricular function.       PMJ/MEDQ  D:  03/02/2005  T:  03/02/2005  Job:  [id number]   cc:   Teena Irani. Arlyce Dice, M.D.  [id number]  [place]  [place] [address]  Fax: [phone]

## 2011-01-23 NOTE — Op Note (Signed)
[facility]  Patient:    Destiny Sharp, Destiny Sharp                      MRN: [id number] Proc. Date: 03/24/01 Adm. Date:  [id number] Attending:  Charlton Haws                           Operative Report  PREOPERATIVE DIAGNOSIS:  Acute abdomen and appendicitis versus acute Crohns.  POSTOPERATIVE DIAGNOSIS:  Acute perforated appendicitis with peritonitis.  PROCEDURE PERFORMED:  Exploratory laparotomy with appendectomy.  SURGEON:  Currie Paris, M.D.  ASSISTANT:  Anselm Pancoast. Zachery Dakins, M.D.  ANESTHESIA:  General endotracheal.  INDICATIONS:  The patient is a 40 year old who presented to the emergency room last night with a one history of abdominal pain, which was diffuse across her lower abdomen, right side worse than left, and had a rather sudden onset the day prior.  She had had a similar episode of pain back in January, but was diagnosed as having constipation at that time.  She had developed fever and had a slightly elevated white count last night.  CT scan showed what appeared to be thickened terminal ileum consistent with Crohns, although the appendix was not seen and there was a question that could represent appendicitis.  The cecal area was not well filled with contrast.  She was admitted and begun on IV antibiotics and NG suction as she clearly had either partial obstruction of the terminal ileum or significant ileus.  Rectal contrast this morning was not much more helpful and she clinically had increasing pain.  It was elected to take her to the operating room for an exploratory laparotomy.  DESCRIPTION OF PROCEDURE:  The patient was brought to the operating room and after satisfactory general endotracheal anesthesia had been obtained, the abdomen was prepped and draped.  A midline incision from the umbilicus down was made and the peritoneal cavity entered.  There was purulent fluid present. It seemed to be rising out of the pelvis.  With  gentle manipulation, I felt an inflammatory mass in the pelvis that was lying posteriorly and just adjacent to the rectosigmoid.  We gentle manipulation, I was able to free this up and there were some loops of small-bowel with exudate stuck to it.  This was the appendix, and it was markedly inflamed and edematous and was lying essentially in the midline and down into the pelvis.  Once I had this mobilized out, we put some tacks in and had to use a Balfour to get adequate exposure because the bowel was quite distended from her ileus.  The cecum had to be mobilized a little bit by incising the posterior peritoneum and freeing this up.  The appendix was pretty well lying free, but the exposure again was difficult and required placement of packs and both hands of the assist to hold the bowel out of the way.  The mesoappendix was divided between clamps and ligated with 2-0 Vicryl down to the base of the appendix.  Once we identified that it was clamped and ligated with an 0 chromic and amputated.  A 2-0 silk pursestring was placed to invert the stump of the appendix and this produced what appeared to be a good closure.  At this point, we then irrigated the abdomen starting in the pelvis and working around to all the quadrants and used a total of approximately 6 liters and all quadrants irrigated clear at the  end.  A final check was made at the area of the appendix and it appeared to be dry with no problems.  The omentum was pulled down later over the lower midline incision.  The wound was closed with a running PDS #1 on the posterior sheath followed by a running #1 PDS on the anterior sheath.  The wound was irrigated and loosely closed with staples with some Telfa wicks placed between the staples.  The patient tolerated the procedure well.  There were no operative complications.  All counts were correct. DD:  03/24/01 TD:  03/25/01 Job: [id number]

## 2011-01-23 NOTE — H&P (Signed)
Destiny Sharp, Destiny Sharp NO.:  [id number]   MEDICAL RECORD NO.:  [id number]           PATIENT TYPE:   LOCATION:                                 FACILITY:   PHYSICIAN:  Destiny Sharp, M.D.  DATE OF BIRTH:  [birth year]   DATE OF ADMISSION:  03/02/2005  DATE OF DISCHARGE:                                HISTORY & PHYSICAL   HISTORY OF PRESENT ILLNESS:  Destiny Sharp is a 40 year old white female who  was seen for evaluation of increasing chest pain and shortness of breath.  In general, she has been in good health.  She has had problems with chronic  lower extremity edema and has been on Lasix off and on for the past 4-5  years.  She apparently had lower extremity venous Doppler studies 1 1/2  years ago that demonstrated no evidence of clots but she states that her  veins are small and deep.  She now presents with a several month history of  increasing shortness of breath on exertion associated with symptoms of  significant chest pain and pressure.  She describes this as a grizzly bear  sitting on her chest.  This is worse when she has to walk uphill or when she  has to walk in the hot, humid weather.  Her symptoms are relieved with rest.  She states she is feeling poorly.  She cannot walk around her hard or even  in the house without developing symptoms and is very limited in her  activity.  She has undergone evaluation to date with an echocardiogram which  was normal and a CT of the chest with contrast which was also normal.  Given  the uncertainty of her diagnosis, her exertional symptoms, and family  history of coronary disease, cardiac catheterization was recommended.   PAST MEDICAL HISTORY:  Significant for vasovagal syncope.  She has been on  chronic Nadolol therapy which completely resolved her symptoms.  The patient  also has had prior appendectomy and history of oral surgery.  She has had  sinus surgery in 2004 for recurrent sinus infections and has a history  of  remote pneumonia.  She also has a history of mild hyperlipidemia.   ALLERGIES:  Codeine and prednisone.   CURRENT MEDICATIONS:  Nadolol 20 mg daily, yazmin daily, Lasix 40 mg daily,  multi-vitamin daily.   SOCIAL HISTORY:  The patient works for [employer].  She denies  tobacco or alcohol use.  She is single.  She has no children and no prior  history of pregnancy.   FAMILY HISTORY:  Father is age 65 and has a history of coronary artery  disease, mother is age 19 and has a history of ovarian cancer.  One brother  alive and well age 74.   REVIEW OF SYMPTOMS:  Weight has apparently been stable.  She states her  clothes feel tighter.  No recent bowel or bladder complaints.  Other review  of systems is negative.   PHYSICAL EXAMINATION:  GENERAL:  The patient is a young, obese white female in no distress.  VITAL SIGNS:  Weight  180 1/2 pounds, blood pressure 110/70, pulse 88 and  regular, respirations normal.  HEENT:  Normocephalic, atraumatic, pupils equal, round, reactive to light  and accommodation, extraocular movements full, oropharynx clear.  NECK:  Without JVD, adenopathy, thyromegaly, or bruits.  LUNGS:  Clear to auscultation and percussion.  CARDIAC:  Regular rate and rhythm without murmurs, gallops, rubs, or click.  ABDOMEN:  Soft, obese, nontender, no masses or bruits.  EXTREMITIES:  She has 1+ lower extremity edema with mild stasis dermatitis  and some excoriations.  There is no evidence of phlebitis or calf  tenderness.  Pulses are palpable.   LABORATORY DATA:  Chest x-ray normal.  EKG normal.  Other labs are pending.   IMPRESSION:  1.  Significant ongoing exertional chest pain and dyspnea consistent with      angina pectoris.  Need to rule out coronary disease or coronary origin.  2.  History of vasovagal syncope well controlled on Nadolol.  3.  Family history of coronary disease.  4.  Mild hyperlipidemia.   PLAN:  Will proceed with diagnostic cardiac  catheterization with further  therapy pending these results.       PMJ/MEDQ  D:  02/27/2005  T:  02/27/2005  Job:  [id number]   cc:   Teena Irani. Arlyce Dice, M.D.  [address]  [place]  [place] [address]  Fax: [phone]

## 2011-01-27 ENCOUNTER — Other Ambulatory Visit (HOSPITAL_COMMUNITY): Payer: BC Managed Care – PPO

## 2011-02-06 ENCOUNTER — Other Ambulatory Visit: Payer: Self-pay | Admitting: Gastroenterology

## 2011-02-11 ENCOUNTER — Ambulatory Visit
Admission: RE | Admit: 2011-02-11 | Discharge: 2011-02-11 | Disposition: A | Discharge status: Home / Self Care | Payer: BC Managed Care – PPO | Source: Ambulatory Visit | Attending: Gastroenterology | Admitting: Gastroenterology

## 2011-02-11 ENCOUNTER — Other Ambulatory Visit (HOSPITAL_COMMUNITY): Payer: BC Managed Care – PPO

## 2011-02-11 MED ORDER — GADOBENATE DIMEGLUMINE 529 MG/ML IV SOLN
20.0000 mL | Freq: Once | INTRAVENOUS | Status: AC | PRN
  Administered 2011-02-11: 20 mL via INTRAVENOUS

## 2011-02-16 ENCOUNTER — Other Ambulatory Visit: Payer: Self-pay | Admitting: Gastroenterology

## 2011-02-16 DIAGNOSIS — K562 Volvulus: Secondary | ICD-10-CM

## 2011-02-19 ENCOUNTER — Ambulatory Visit
Admission: RE | Admit: 2011-02-19 | Discharge: 2011-02-19 | Disposition: A | Discharge status: Home / Self Care | Payer: BC Managed Care – PPO | Source: Ambulatory Visit | Attending: Gastroenterology | Admitting: Gastroenterology

## 2011-02-19 ENCOUNTER — Other Ambulatory Visit: Payer: Self-pay | Admitting: Gastroenterology

## 2011-02-19 DIAGNOSIS — K562 Volvulus: Secondary | ICD-10-CM

## 2012-02-11 ENCOUNTER — Emergency Department (HOSPITAL_COMMUNITY)
Admission: EM | Admit: 2012-02-11 | Discharge: 2012-02-11 | Disposition: A | Discharge status: Home Health | Payer: BC Managed Care – PPO | Attending: Emergency Medicine | Admitting: Emergency Medicine

## 2012-02-11 ENCOUNTER — Encounter (HOSPITAL_COMMUNITY): Payer: Self-pay | Admitting: Emergency Medicine

## 2012-02-11 DIAGNOSIS — Y92009 Unspecified place in unspecified non-institutional (private) residence as the place of occurrence of the external cause: Secondary | ICD-10-CM | POA: Insufficient documentation

## 2012-02-11 DIAGNOSIS — IMO0002 Reserved for concepts with insufficient information to code with codable children: Secondary | ICD-10-CM | POA: Insufficient documentation

## 2012-02-11 DIAGNOSIS — X58XXXA Exposure to other specified factors, initial encounter: Secondary | ICD-10-CM | POA: Insufficient documentation

## 2012-02-11 DIAGNOSIS — T148XXA Other injury of unspecified body region, initial encounter: Secondary | ICD-10-CM

## 2012-02-11 HISTORY — DX: Hypothyroidism, unspecified: E03.9

## 2012-02-11 NOTE — Discharge Instructions (Signed)
Wood Splinters Wood splinters need to be removed because they can cause skin irritation and infection. If they are close to the surface, splinters can usually be removed easily. Deep splinters may be hard to locate and need treatment by a surgeon. SPLINTER REMOVAL Removal of splinters by your caregiver is considered a surgical procedure.   The area is carefully cleaned. You may require a small amount of anaesthesia (medicine injected near the splinter to numb the tissue and lessen pain). After the splinter is removed, the area will be cleaned again. A bandage is applied.   If your splinter is under a fingernail or toenail, then a small section of the nail may need to be removed. As long as the splinter did not extend to the base of the nail, the nail usually grows back normally.   A splinter that is deeper, more contaminated, or that gets near a structure such as a bone, nerve or blood vessel may need to be removed by a Careers adviser.   You may need special X-rays or scans if the splinter is hard to locate.   Every attempt is made to remove the entire splinter. However, small particles may remain. Tell your caregiver if you feel that a part of the splinter was left behind.  HOME CARE INSTRUCTIONS   Keep the injured area high up (elevated).   Use the injured area as little as possible.   Keep the injured area clean and dry. Follow any directions from your caregiver.   Keep any follow-up or wound check appointments.  You might need a tetanus shot now if:  You have no idea when you had the last one.   You have never had a tetanus shot before.   The injured area had dirt in it.  Even if you have already removed the splinter, call your caregiver to get a tetanus shot if you need one.  If you need a tetanus shot, and you decide not to get one, there is a rare chance of getting tetanus. Sickness from tetanus can be serious. If you did get a tetanus shot, your arm may swell, get red and warm to the  touch at the shot site. This is common and not a problem. SEEK MEDICAL CARE IF:   A splinter has been removed, but you are not better in a day or two.   You develop a temperature.   Signs of infection develop such as:   Redness, swelling or pus around the wound.   Red streaks spreading back from your wound towards your body.  Document Released: 10/01/2004 Document Revised: 08/13/2011 Document Reviewed: 09/03/2008 [facility] Patient Information 2012 [place], [place]. The splinter, has been removed

## 2012-02-11 NOTE — ED Provider Notes (Signed)
History     CSN: [id number]  Arrival date & time 02/11/12  2025   None     Chief Complaint  Patient presents with  . Foreign Body in Skin    (Consider location/radiation/quality/duration/timing/severity/associated sxs/prior treatment) HPI Comments: Vision presents with a small splinter, at the base of the left fifth toe.  She said she was walking in her home in socks and felt a stab early.  This morning.  She has not tried to remove the splinter  The history is provided by the patient.    Past Medical History  Diagnosis Date  . Hypothyroidism     Past Surgical History  Procedure Date  . Appendectomy   . Nasal sinus surgery     No family history on file.  History  Substance Use Topics  . Smoking status: Never Smoker   . Smokeless tobacco: Not on file  . Alcohol Use: No    OB History    Grav Para Term Preterm Abortions TAB SAB Ect Mult Living                  Review of Systems  Constitutional: Negative for fever.  Cardiovascular: Negative for leg swelling.  Skin: Positive for wound.  Neurological: Negative for dizziness and numbness.    Allergies  Codeine  Home Medications   Current Outpatient Rx  Name Route Sig Dispense Refill  . CALCIUM CARBONATE 1250 MG PO TABS Oral Take 1 tablet by mouth daily.    . OMEGA-3 FATTY ACIDS 1000 MG PO CAPS Oral Take 2 g by mouth daily.    Marland Kitchen LEVOTHYROXINE SODIUM 50 MCG PO TABS Oral Take 50 mcg by mouth daily.    . ADULT MULTIVITAMIN W/MINERALS CH Oral Take 1 tablet by mouth daily.    Marland Kitchen VITAMIN C 500 MG PO TABS Oral Take 1,000 mg by mouth daily.      BP 123/82  Pulse 98  Temp(Src) 98.3 F (36.8 C) (Oral)  SpO2 99%  LMP 01/31/2012  Physical Exam  Constitutional: She appears well-developed and well-nourished.  HENT:  Head: Normocephalic.  Eyes: Pupils are equal, round, and reactive to light.  Neck: Normal range of motion.  Cardiovascular: Normal rate.   Pulmonary/Chest: Effort normal.  Musculoskeletal: Normal  range of motion.  Neurological: She is alert.  Skin:       Superficial splinter embedded in the cavernous at the [facility] of the left fifth toe on the sole of the foot    ED Course  FOREIGN BODY REMOVAL Date/Time: 02/11/2012 10:29 PM Performed by: Arman Filter Authorized by: Arman Filter Consent: Verbal consent obtained. Risks and benefits: risks, benefits and alternatives were discussed Consent given by: patient Patient understanding: patient states understanding of the procedure being performed Patient identity confirmed: verbally with patient Time out: Immediately prior to procedure a "time out" was called to verify the correct patient, procedure, equipment, support staff and site/side marked as required. Body area: skin Anesthesia: local infiltration Local anesthetic: lidocaine 1% without epinephrine Anesthetic total: 1 ml Patient sedated: no Patient restrained: no Localization method: visualized Removal mechanism: hemostat and scalpel Tendon involvement: none Depth: subcutaneous Complexity: simple 1 objects recovered. Objects recovered: splinter Patient tolerance: Patient tolerated the procedure well with no immediate complications.   (including critical care time)  Labs Reviewed - No data to display No results found.   1. Splinter in skin       MDM  Splinter in foot L  Arman Filter, NP 02/11/12 2230  Arman Filter, NP 02/11/12 2231

## 2012-02-11 NOTE — ED Notes (Signed)
PT. REPORTS SPLINTER AT LEFT FOOT TODAY WHILE WALKING AT LAUNDRY ROOM , NO BLEEDING .

## 2012-02-13 NOTE — ED Provider Notes (Signed)
Medical screening examination/treatment/procedure(s) were performed by non-physician practitioner and as supervising physician I was immediately available for consultation/collaboration.   Torina Ey A Josee Speece, MD 02/13/12 0745 

## 2012-07-05 ENCOUNTER — Other Ambulatory Visit: Payer: Self-pay | Admitting: Obstetrics and Gynecology

## 2012-07-05 ENCOUNTER — Other Ambulatory Visit: Payer: Self-pay | Admitting: Obstetrics & Gynecology

## 2012-07-05 DIAGNOSIS — Z1231 Encounter for screening mammogram for malignant neoplasm of breast: Secondary | ICD-10-CM

## 2012-08-16 ENCOUNTER — Ambulatory Visit: Payer: BC Managed Care – PPO

## 2012-09-20 ENCOUNTER — Other Ambulatory Visit (HOSPITAL_COMMUNITY): Payer: Self-pay | Admitting: Nurse Practitioner

## 2012-09-20 DIAGNOSIS — B741 Filariasis due to Brugia malayi: Secondary | ICD-10-CM

## 2012-09-26 ENCOUNTER — Ambulatory Visit (HOSPITAL_COMMUNITY)
Admission: RE | Admit: 2012-09-26 | Discharge: 2012-09-26 | Disposition: A | Discharge status: Home / Self Care | Payer: BC Managed Care – PPO | Source: Ambulatory Visit | Attending: Nurse Practitioner | Admitting: Nurse Practitioner

## 2012-09-26 DIAGNOSIS — B741 Filariasis due to Brugia malayi: Secondary | ICD-10-CM

## 2012-09-28 ENCOUNTER — Other Ambulatory Visit: Payer: Self-pay | Admitting: Family Medicine

## 2012-09-28 DIAGNOSIS — I872 Venous insufficiency (chronic) (peripheral): Secondary | ICD-10-CM

## 2012-09-28 DIAGNOSIS — R600 Localized edema: Secondary | ICD-10-CM

## 2012-10-05 ENCOUNTER — Emergency Department (HOSPITAL_COMMUNITY)
Admission: EM | Admit: 2012-10-05 | Discharge: 2012-10-06 | Disposition: A | Discharge status: Home / Self Care | Payer: BC Managed Care – PPO | Attending: Emergency Medicine | Admitting: Emergency Medicine

## 2012-10-05 ENCOUNTER — Ambulatory Visit
Admission: RE | Admit: 2012-10-05 | Discharge: 2012-10-05 | Disposition: A | Discharge status: Home / Self Care | Payer: BC Managed Care – PPO | Source: Ambulatory Visit | Attending: Family Medicine | Admitting: Family Medicine

## 2012-10-05 ENCOUNTER — Emergency Department (HOSPITAL_COMMUNITY): Payer: BC Managed Care – PPO

## 2012-10-05 ENCOUNTER — Encounter (HOSPITAL_COMMUNITY): Payer: Self-pay | Admitting: Emergency Medicine

## 2012-10-05 DIAGNOSIS — R600 Localized edema: Secondary | ICD-10-CM

## 2012-10-05 DIAGNOSIS — R1013 Epigastric pain: Secondary | ICD-10-CM | POA: Insufficient documentation

## 2012-10-05 DIAGNOSIS — R079 Chest pain, unspecified: Secondary | ICD-10-CM

## 2012-10-05 DIAGNOSIS — I872 Venous insufficiency (chronic) (peripheral): Secondary | ICD-10-CM

## 2012-10-05 DIAGNOSIS — I10 Essential (primary) hypertension: Secondary | ICD-10-CM | POA: Insufficient documentation

## 2012-10-05 DIAGNOSIS — Z3202 Encounter for pregnancy test, result negative: Secondary | ICD-10-CM | POA: Insufficient documentation

## 2012-10-05 DIAGNOSIS — E039 Hypothyroidism, unspecified: Secondary | ICD-10-CM | POA: Insufficient documentation

## 2012-10-05 DIAGNOSIS — R0602 Shortness of breath: Secondary | ICD-10-CM | POA: Insufficient documentation

## 2012-10-05 LAB — BASIC METABOLIC PANEL
BUN: 12 mg/dL (ref 6–23)
CO2: 25 mEq/L (ref 19–32)
Chloride: 104 mEq/L (ref 96–112)
Creatinine, Ser: 0.63 mg/dL (ref 0.50–1.10)

## 2012-10-05 LAB — POCT I-STAT TROPONIN I: Troponin i, poc: 0 ng/mL (ref 0.00–0.08)

## 2012-10-05 LAB — CBC
HCT: 38.6 % (ref 36.0–46.0)
Hemoglobin: 13.9 g/dL (ref 12.0–15.0)
MCH: 33 pg (ref 26.0–34.0)
MCV: 91.7 fL (ref 78.0–100.0)
RBC: 4.21 MIL/uL (ref 3.87–5.11)

## 2012-10-05 LAB — TROPONIN I: Troponin I: <0.3 ng/mL (ref <0.30)

## 2012-10-05 LAB — POCT PREGNANCY, URINE: Preg Test, Ur: NEGATIVE

## 2012-10-05 LAB — PRO B NATRIURETIC PEPTIDE: Pro B Natriuretic peptide (BNP): 6.3 pg/mL (ref 0–125)

## 2012-10-05 MED ORDER — NITROGLYCERIN 0.4 MG SL SUBL: 0.4000 mg | SUBLINGUAL_TABLET | SUBLINGUAL | Status: DC | PRN

## 2012-10-05 MED ORDER — ASPIRIN 325 MG PO TABS
325.0000 mg | ORAL_TABLET | ORAL | Status: AC
  Administered 2012-10-05: 325 mg via ORAL
  Filled 2012-10-05: qty 1

## 2012-10-05 NOTE — ED Notes (Signed)
Pt st's her chest pain is down to a 1/10; walked pt to bathroom to see if she would become dyspneic with exertion and pt did not, sats remained above 98% during ambulation

## 2012-10-05 NOTE — ED Notes (Signed)
Pt st's she has felt shortness of breath since Christmas, st's started having mid chest pain on Sun.  Pt was seen by her MD yesterday and today prior to coming to ED.  St's they told her all test were good.

## 2012-10-05 NOTE — ED Provider Notes (Signed)
This chart was scribed for Glynn Octave, MD by Shari Heritage, ED Scribe. The patient was seen in room A04C/A04C. Patient was evaluated at 10:00 PM.   Destiny Sharp is a 42 y.o female compalining of persistent, mild to moderate shortness of breath onset 4-5 weeks ago and gradually worsening, intermittent, non-radiating mid chest pain onset 3 days ago. Patient says that pain is improved when she is at rest. She states increased SOB and fatigue with exertion. Patient denies cough, fever or diaphoresis. Patient with medical history of fibromyalgia and peripheral edema. She denies cardiac or lung disease. Patient says that she had a full cardiac workup with Dr. [place] a few years ago that was unconcerning.   Physical Exam:  Right chest wall tenderness. Breath sounds equal. Regular rate and rhythm. Symmetric pitting edema up to mid tibia.   Patient complains of one month of gradually worsening shortness of breath with intermittent right-sided chest pain is worse with palpation. She endorses generalized fatigue. She denies any cardiac history had negative stress test several years ago. She does not smoke. Her only risk factors high blood pressure.  Her EKG is normal sinus rhythm. She has a negative troponin and D-dimer.  I personally performed the services described in this documentation, which was scribed in my presence. The recorded information has been reviewed and is accurate.   Glynn Octave, MD 10/06/12 1134

## 2012-10-05 NOTE — ED Provider Notes (Signed)
History     CSN: [id number]  Arrival date & time 10/05/12  1501   First MD Initiated Contact with Patient 10/05/12 2016      Chief Complaint  Patient presents with  . Chest Pain    (Consider location/radiation/quality/duration/timing/severity/associated sxs/prior treatment) HPI  Destiny Sharp is a 42 y.o. female  with a hx of hypertension and hypothyroidism presents to the Emergency Department complaining of gradual, persistent, progressively worsening chest pain and shortness of breath onset sometime after Christmas. Patient has had 3 weeks of shortness of breath worse with exertion. She states she began to have chest pain on Sunday. Chest pain is intermittent, a combination of sharp in nature and with pressure. It is located in the epigastric region and radiates into the chest, patient rates it at a 7/10. Patient saw her primary care yesterday and and had blood work and chest x-ray. She recently had a venous and arterial Doppler of her lower extremities which were negative for clots. Patient has no history of DVT.  Patient states that today's episode was worse and has been in last few days. She states she began short of breath walking from her car to the bus and had to sit down and rest. Associated symptoms include stress breath and chest pain.  Nothing makes it better and nothing makes it worse.  Pt denies fever, chills, headache, neck pain, abdominal pain, nausea, vomiting, diarrhea, weakness, dizziness, syncope, dysuria, hematuria.   Past Medical History  Diagnosis Date  . Hypothyroidism   . Hypertension     Past Surgical History  Procedure Date  . Appendectomy   . Nasal sinus surgery     No family history on file.  History  Substance Use Topics  . Smoking status: Never Smoker   . Smokeless tobacco: Not on file  . Alcohol Use: No    OB History    Grav Para Term Preterm Abortions TAB SAB Ect Mult Living                  Review of Systems  Constitutional:  Negative for fever, diaphoresis, appetite change, fatigue and unexpected weight change.  HENT: Negative for mouth sores and neck stiffness.   Eyes: Negative for visual disturbance.  Respiratory: Positive for shortness of breath. Negative for cough, chest tightness and wheezing.   Cardiovascular: Positive for chest pain.  Gastrointestinal: Negative for nausea, vomiting, abdominal pain, diarrhea and constipation.  Genitourinary: Negative for dysuria, urgency, frequency and hematuria.  Musculoskeletal: Negative for back pain.  Skin: Negative for rash.  Neurological: Negative for syncope, light-headedness and headaches.  Hematological: Does not bruise/bleed easily.  Psychiatric/Behavioral: Negative for sleep disturbance. The patient is not nervous/anxious.   All other systems reviewed and are negative.    Allergies  Codeine; Hepatitis b virus vaccine; and Pneumococcal vaccines  Home Medications   Current Outpatient Rx  Name  Route  Sig  Dispense  Refill  . CALCIUM CARBONATE 1250 MG PO TABS   Oral   Take 1 tablet by mouth daily.         . CHLORTHALIDONE 25 MG PO TABS   Oral   Take 25 mg by mouth daily.         . OMEGA-3 FATTY ACIDS 1000 MG PO CAPS   Oral   Take 2 g by mouth daily.         Marland Kitchen LEVOTHYROXINE SODIUM 50 MCG PO TABS   Oral   Take 50 mcg by mouth daily.         Marland Kitchen  ADULT MULTIVITAMIN W/MINERALS CH   Oral   Take 1 tablet by mouth daily.         Marland Kitchen VITAMIN B-12 1000 MCG PO TABS   Oral   Take 1,000 mcg by mouth daily.         Marland Kitchen VITAMIN C 500 MG PO TABS   Oral   Take 1,000 mg by mouth daily.         Marland Kitchen OMEPRAZOLE 20 MG PO CPDR   Oral   Take 1 capsule (20 mg total) by mouth daily.   30 capsule   0     BP 114/67  Pulse 87  Temp 98 F (36.7 C) (Oral)  Resp 13  SpO2 99%  LMP 09/14/2012  Physical Exam  Nursing note and vitals reviewed. Constitutional: She is oriented to person, place, and time. She appears well-developed and well-nourished. No  distress.  HENT:  Head: Normocephalic and atraumatic.  Mouth/Throat: Oropharynx is clear and moist. No oropharyngeal exudate.  Eyes: Conjunctivae normal are normal. Pupils are equal, round, and reactive to light. No scleral icterus.  Neck: Normal range of motion and full passive range of motion without pain. Neck supple. No spinous process tenderness and no muscular tenderness present. Normal range of motion present.  Cardiovascular: Normal rate, regular rhythm, normal heart sounds and intact distal pulses.  Exam reveals no gallop and no friction rub.   No murmur heard. Pulmonary/Chest: Effort normal and breath sounds normal. No respiratory distress. She has no wheezes. She has no rales. She exhibits no tenderness.  Abdominal: Soft. Bowel sounds are normal. She exhibits no distension and no mass. There is no tenderness. There is no rebound and no guarding.  Musculoskeletal: Normal range of motion. She exhibits no edema (no pitting edema in the lower extremities) and no tenderness.  Lymphadenopathy:    She has no cervical adenopathy.  Neurological: She is alert and oriented to person, place, and time. She exhibits normal muscle tone. Coordination normal.       Speech is clear and goal oriented Moves extremities without ataxia  Skin: Skin is warm and dry. No rash noted. She is not diaphoretic. No erythema.       Chronic venous stasis changes  Psychiatric: She has a normal mood and affect.    ED Course  Procedures (including critical care time)  Labs Reviewed  BASIC METABOLIC PANEL - Abnormal; Notable for the following:    Glucose, Bld 105 (*)     All other components within normal limits  CBC  PRO B NATRIURETIC PEPTIDE  POCT I-STAT TROPONIN I  D-DIMER, QUANTITATIVE  TROPONIN I  POCT PREGNANCY, URINE   US Venous Img Lower Bilateral  10/05/2012  *RADIOLOGY REPORT*  Clinical Data: Lower extremity edema, chronic fatigue and swelling concerning for venous insufficiency  BILATERAL LOWER  EXTREMITY VENOUS DUPLEX ULTRASOUND  Technique:  Gray-scale sonography with graded compression, as well as color Doppler and duplex ultrasound, were performed to evaluate the deep and superficial veins of both lower extremities.  Spectral Doppler was utilized to evaluate flow at rest and with distal augmentation maneuvers.  A complete superficial venous insufficiency exam was performed in the upright standing position. I personally performed the technical portion of the exam.  Comparison:  None.  Findings:  Right Lower Extremity:  The right saphenofemoral junction is patent and normal.  No evidence of venous insufficiency.  Right G S V is normal in caliber and remains patent without reflux or insufficiency.  Right small saphenous  vein is also normal in caliber without reflux.  Right lower extremity deep veins are patent and compressible with augmentation and normal phasicity. Negative for DVT.  Left Lower Extremity:  The left saphenofemoral junction, great saphenous vein, and small saphenous vein are also all normal without venous insufficiency or reflux.  No sub surface varicosities.  The left femoral and popliteal deep veins demonstrate normal phasicity, augmentation and compressibility without DVT.  IMPRESSION: Normal G S V and S S V anatomy bilaterally.  Negative for superficial saphenous reflux or sub surface varicosities in either extremity.  Negative for DVT.   Original Report Authenticated By: Judie Petit. Miles Costain, M.D.    Dg Chest Port 1 View  10/05/2012  *RADIOLOGY REPORT*  Clinical Data: Chest pain.  PORTABLE CHEST - 1 VIEW  Comparison: 05/14/2009 chest radiograph  Findings: The cardiomediastinal silhouette is unremarkable. There is no evidence of focal airspace disease, pulmonary edema, suspicious pulmonary nodule/mass, pleural effusion, or pneumothorax. No acute bony abnormalities are identified.  IMPRESSION: No evidence of active cardiopulmonary disease.   Original Report Authenticated By: Harmon Pier, M.D.     [facility]iologist Eval & Mgmt  10/05/2012  *RADIOLOGY REPORT*  NEW PATIENT OFFICE VISIT - LEVEL III [phone])  Chief Complaint:  Chronic bilateral lower extremity edema, fatigue, leg pain, dermatitis.  Evaluate for venous insufficiency.  History:  42 year old female with chronic bilateral lower extremity swelling, fatigue and pain.  She has chronic lower calf and ankle mild dermatitis changes with eczema and redness.  No history of active bleeding or ulceration.  She has had an extensive workup with previous venous Doppler studies demonstrate no evidence of DVT.  She says she has had arterial exams demonstrate no evidence of peripheral vascular disease.  She has been evaluated by vascular surgery and cardiology.  She now presents for evaluation for venous insufficiency.  She reports chronic leg pain, fatigue and heaviness.  This worsens throughout the day.  She has a fairly sedentary life style and works as a Surveyor, mining.  She has been intermittently using compression stockings with no significant improvement.  Past Medical History:  Migraine headaches, vasovagal syncope, fibromyalgia, thyroid disease  Medications:  Levo thyroxine 50 mcg daily, Caltrate D 600 daily, Fish oil 1200 4 po qd, oscal 1 po qd, vit B12 and C daily  Allergies:  Codeine, Hep B vaccine, pneumococcal vaccine, immtrex  Social History:  Negative for alcohol or drug use.  Current school bus driver.  No tobacco use.  Daily caffeine intake.  Family History:  Positive for ovarian cancer in her mother. Maternal aunt with breast cancer.  Review of Systems:  No shortness of breath, nausea, diaphoresis, dizziness.  Mild epigastric discomfort, chronic.  Chronic lower extremity edema and symptoms as above.  Exam:  Afebrile, vital signs stable. General:  No acute distress.  Mildly obese. Affect: appropriate.  Lower extremities:  Mild symmetric +2 pedal edema.  Lower calf and ankle mild redness, eczema, and skin thickening noted.  Pattern is consistent  with dermatitis. Pulses:  Normal symmetric dorsalis pedis pulses.  Normal capillary refill.  Today, I spent approximate 30 minutes with the patient and reviewed the patients physical exam and ultrasound findings.  Ultrasound was dictated separately but confirms normal saphenous anatomy bilaterally.  No significant venous insufficiency or sub surface varicosities.  Negative for DVT.  Therefore, she does not need any transcatheter laser venous treatments, sclerotherapy, or vein removal.  Her lower extremity venous anatomy is normal.  She appears to have chronic lower  extremity edema/dermatitis.  I suspect this is multifactorial but predominantly related to her obesity and sedentary life style.  I would recommend a conservative treatment plan with daily knee high prescription strength compression stockings (20/30 mmHg).  Daily walking and initiating a weight management program.  These findings were discussed at length with the patient.  She has a clear understanding.  Certainly if any visible varicosities develop she could be reevaluated as-needed.  Assessment and Plan:  Nonspecific symmetric lower extremity edema with chronic calf and ankle mild dermatitis, suspect related to obesity and sedentary life style.  Plan:  20/30 mmHg compression stockings. Daily exercise Initiate weight management program   Original Report Authenticated By: Judie Petit. Shick, M.D.    ECG:  Date: 10/05/2012  Rate: 90  Rhythm: normal sinus rhythm  QRS Axis: normal  Intervals: normal  ST/T Wave abnormalities: normal  Conduction Disutrbances:none  Narrative Interpretation: nonischemic ECG  Old EKG Reviewed: none available    1. Shortness of breath   2. Epigastric pain   3. Chest pain       MDM  Cheryl Flash this emergency department complaining of chest pain and shortness of breath. Patient patient history and previous workup I doubt ACS but will proceed with workup.   Test negative, troponin negative, d-dimer negative,  second troponin negative, BMP unremarkable, BNP unremarkable, CBC unremarkable. Patient is to be discharged with recommendation to follow up with PCP and cardiology in regards to today's hospital visit. Chest pain is not likely of cardiac or pulmonary etiology d/t presentation, perc negative, VSS, no tracheal deviation, no JVD or new murmur, RRR, breath sounds equal bilaterally, EKG without acute abnormalities, negative troponin, and negative CXR. Pt has been advised start a PPI and return to the ED if CP becomes exertional, associated with diaphoresis or nausea, radiates to left jaw/arm, worsens or becomes concerning in any way. Pt appears reliable for follow up and is agreeable to discharge.   Case has been discussed with and seen by Dr. Glynn Octave who agrees with the above plan to discharge.    1. Medications: Omeprazole, usual home medications 2. Treatment: rest, drink plenty of fluids, take medications as prescribed, sleep with your CPAP 3. Follow Up: Please followup with your primary doctor for discussion of your diagnoses and further evaluation after today's visit; follow-up with your cardiologist tomorrow morning      Dierdre Forth, PA-C 10/06/12 0011

## 2012-10-06 MED ORDER — OMEPRAZOLE 20 MG PO CPDR: 20.0000 mg | DELAYED_RELEASE_CAPSULE | Freq: Every day | ORAL | Status: DC

## 2012-10-06 NOTE — ED Provider Notes (Signed)
Medical screening examination/treatment/procedure(s) were conducted as a shared visit with non-physician practitioner(s) and myself.  I personally evaluated the patient during the encounter  See my additional note  Glynn Octave, MD 10/06/12 [phone]

## 2012-10-07 ENCOUNTER — Encounter: Payer: Self-pay | Admitting: Cardiology

## 2012-10-07 ENCOUNTER — Encounter: Payer: Self-pay | Admitting: *Deleted

## 2012-10-07 ENCOUNTER — Ambulatory Visit (INDEPENDENT_AMBULATORY_CARE_PROVIDER_SITE_OTHER): Payer: BC Managed Care – PPO | Admitting: Cardiology

## 2012-10-07 VITALS — BP 90/40 | HR 84 | Ht 62.0 in | Wt 187.0 lb

## 2012-10-07 DIAGNOSIS — R0602 Shortness of breath: Secondary | ICD-10-CM

## 2012-10-07 DIAGNOSIS — E039 Hypothyroidism, unspecified: Secondary | ICD-10-CM | POA: Insufficient documentation

## 2012-10-07 DIAGNOSIS — J4520 Mild intermittent asthma, uncomplicated: Secondary | ICD-10-CM | POA: Insufficient documentation

## 2012-10-07 DIAGNOSIS — R609 Edema, unspecified: Secondary | ICD-10-CM

## 2012-10-07 DIAGNOSIS — R5383 Other fatigue: Secondary | ICD-10-CM | POA: Insufficient documentation

## 2012-10-07 DIAGNOSIS — R079 Chest pain, unspecified: Secondary | ICD-10-CM

## 2012-10-07 DIAGNOSIS — I10 Essential (primary) hypertension: Secondary | ICD-10-CM | POA: Insufficient documentation

## 2012-10-07 NOTE — Assessment & Plan Note (Signed)
Management per primary care. 

## 2012-10-07 NOTE — Assessment & Plan Note (Signed)
Etiology unclear. Symptoms out of proportion to physical findings. BNP normal. Electrocardiogram normal. Chest x-ray negative. Lower extremity venous Dopplers and d-dimer negative. I will plan an echocardiogram to reassess LV function. If negative no plans for further cardiac evaluation. She will followup with her primary care physician for this issue.

## 2012-10-07 NOTE — Progress Notes (Signed)
HPI: 42 year-old female for evaluation of chest pain. Patient also apparently with h/o vagal syncope. Patient previously seen by Dr. [place]. Cardiac catheterization in 2006 showed normal LV function and normal coronary arteries. Echocardiogram prior to this apparently normal the final results not available. Carotid Dopplers in 2010 showed a 20-39% stenosis bilaterally. Seen in emergency room on 10/05/2012 for dyspnea and CP. Chest x-ray negative. Venous Dopplers in January 2014 showed no DVT. D-dimer and pregnancy test negative. Pro BNP 6.3. Troponin negative. Hemoglobin 13.9. Patient states she has been short of breath for 2 weeks. This increases with exertion but also occurs at rest. There is no orthopnea but she has had pedal edema he apparently is a chronic issue. She also describes chest pain. It is epigastric to substernal in location and has been continuous for 5 days. It is not pleuritic or positional and not related to food.  Current Outpatient Prescriptions  Medication Sig Dispense Refill  . calcium carbonate (OS-CAL - DOSED IN MG OF ELEMENTAL CALCIUM) 1250 MG tablet Take 1 tablet by mouth daily.      . chlorthalidone (HYGROTON) 25 MG tablet Take 25 mg by mouth daily.      . fish oil-omega-3 fatty acids 1000 MG capsule Take 2 g by mouth daily.      Marland Kitchen levothyroxine (SYNTHROID, LEVOTHROID) 50 MCG tablet Take 50 mcg by mouth daily.      . Multiple Vitamin (MULTIVITAMIN WITH MINERALS) TABS Take 1 tablet by mouth daily.      Marland Kitchen omeprazole (PRILOSEC) 20 MG capsule Take 1 capsule (20 mg total) by mouth daily.  30 capsule  0  . vitamin B-12 (CYANOCOBALAMIN) 1000 MCG tablet Take 1,000 mcg by mouth daily.      . vitamin C (ASCORBIC ACID) 500 MG tablet Take 1,000 mg by mouth daily.        Allergies  Allergen Reactions  . Codeine Nausea And Vomiting  . Hepatitis B Virus Vaccine Itching  . Pneumococcal Vaccines Itching    Past Medical History  Diagnosis Date  . Hypothyroidism   . Hypertension     . SOB (shortness of breath)   . Fatigue   . Edema     Past Surgical History  Procedure Date  . Appendectomy   . Nasal sinus surgery     History   Social History  . Marital Status: Single    Spouse Name: N/A    Number of Children: N/A  . Years of Education: N/A   Occupational History  . Not on file.   Social History Main Topics  . Smoking status: Never Smoker   . Smokeless tobacco: Not on file  . Alcohol Use: No  . Drug Use: No  . Sexually Active:    Other Topics Concern  . Not on file   Social History Narrative  . No narrative on file    Family History  Problem Relation Age of Onset  . CAD    . Cancer    . Diabetes      ROS: no fevers or chills, productive cough, hemoptysis, dysphasia, odynophagia, melena, hematochezia, dysuria, hematuria, rash, seizure activity, orthopnea, PND, pedal edema, claudication. Remaining systems are negative.  Physical Exam:   Last menstrual period 09/14/2012.  General:  Well developed/well nourished in NAD Skin warm/dry Patient not depressed No peripheral clubbing Back-normal HEENT-normal/normal eyelids Neck supple/normal carotid upstroke bilaterally; no bruits; no JVD; no thyromegaly chest - CTA/ normal expansion CV - RRR/normal S1 and S2; no murmurs, rubs  or gallops;  PMI nondisplaced Abdomen -mild epigastric tenderness to palpation/ND, no HSM, no mass, + bowel sounds, no bruit 2+ femoral pulses, no bruits Ext-trace edema, no chords, 2+ DP Neuro-grossly nonfocal  ECG 10/05/2012-sinus rhythm with no ST changes.  Electrocardiogram today shows sinus rhythm with no ST changes.

## 2012-10-07 NOTE — Patient Instructions (Addendum)
Your physician recommends that you schedule a follow-up appointment in: AS NEEDED PENDING TEST RESULTS  Your physician has requested that you have an echocardiogram. Echocardiography is a painless test that uses sound waves to create images of your heart. It provides your doctor with information about the size and shape of your heart and how well your heart's chambers and valves are working. This procedure takes approximately one hour. There are no restrictions for this procedure.    

## 2012-10-07 NOTE — Assessment & Plan Note (Signed)
Symptoms extremely atypical and continuous for 5 days. Recent troponin normal. Electrocardiogram shows no ST changes. Previous cardiac catheterization showed no coronary disease. I do not think further cardiac workup is indicated. There is a question about her gallbladder based on she had her father's report. There is question tenderness on examination of the symptoms out of proportion to physical findings. I have asked him to followup of her primary care physician and gastroenterology if this continues.

## 2012-10-12 ENCOUNTER — Encounter (HOSPITAL_COMMUNITY): Payer: Self-pay | Admitting: Emergency Medicine

## 2012-10-12 ENCOUNTER — Emergency Department (HOSPITAL_COMMUNITY): Payer: BC Managed Care – PPO

## 2012-10-12 ENCOUNTER — Emergency Department (HOSPITAL_COMMUNITY)
Admission: EM | Admit: 2012-10-12 | Discharge: 2012-10-12 | Disposition: A | Discharge status: Home / Self Care | Payer: BC Managed Care – PPO | Attending: Emergency Medicine | Admitting: Emergency Medicine

## 2012-10-12 DIAGNOSIS — Z8701 Personal history of pneumonia (recurrent): Secondary | ICD-10-CM | POA: Insufficient documentation

## 2012-10-12 DIAGNOSIS — R0602 Shortness of breath: Secondary | ICD-10-CM

## 2012-10-12 DIAGNOSIS — Z79899 Other long term (current) drug therapy: Secondary | ICD-10-CM | POA: Insufficient documentation

## 2012-10-12 DIAGNOSIS — E785 Hyperlipidemia, unspecified: Secondary | ICD-10-CM | POA: Insufficient documentation

## 2012-10-12 DIAGNOSIS — E039 Hypothyroidism, unspecified: Secondary | ICD-10-CM | POA: Insufficient documentation

## 2012-10-12 DIAGNOSIS — R064 Hyperventilation: Secondary | ICD-10-CM | POA: Insufficient documentation

## 2012-10-12 DIAGNOSIS — Z8669 Personal history of other diseases of the nervous system and sense organs: Secondary | ICD-10-CM | POA: Insufficient documentation

## 2012-10-12 DIAGNOSIS — R609 Edema, unspecified: Secondary | ICD-10-CM | POA: Insufficient documentation

## 2012-10-12 DIAGNOSIS — R110 Nausea: Secondary | ICD-10-CM | POA: Insufficient documentation

## 2012-10-12 LAB — POCT I-STAT, CHEM 8
BUN: 13 mg/dL (ref 6–23)
Calcium, Ion: 1.18 mmol/L (ref 1.12–1.23)
Chloride: 106 meq/L (ref 96–112)
Creatinine, Ser: 0.8 mg/dL (ref 0.50–1.10)
Glucose, Bld: 115 mg/dL — ABNORMAL HIGH (ref 70–99)
HCT: 37 % (ref 36.0–46.0)
Hemoglobin: 12.6 g/dL (ref 12.0–15.0)
Potassium: 3.4 meq/L — ABNORMAL LOW (ref 3.5–5.1)
Sodium: 143 meq/L (ref 135–145)
TCO2: 25 mmol/L (ref 0–100)

## 2012-10-12 LAB — BLOOD GAS, ARTERIAL
Acid-Base Excess: 0.4 mmol/L (ref 0.0–2.0)
Drawn by: 33147
O2 Saturation: 99.4 %
pCO2 arterial: 20.3 mmHg — ABNORMAL LOW (ref 35.0–45.0)

## 2012-10-12 MED ORDER — LORAZEPAM 1 MG PO TABS
1.0000 mg | ORAL_TABLET | Freq: Once | ORAL | Status: DC
  Filled 2012-10-12: qty 1

## 2012-10-12 MED ORDER — LORAZEPAM 2 MG/ML IJ SOLN: 1.0000 mg | Freq: Once | INTRAMUSCULAR | Status: DC

## 2012-10-12 MED ORDER — IPRATROPIUM BROMIDE 0.02 % IN SOLN
0.5000 mg | Freq: Once | RESPIRATORY_TRACT | Status: AC
  Administered 2012-10-12: 0.5 mg via RESPIRATORY_TRACT
  Filled 2012-10-12: qty 2.5

## 2012-10-12 MED ORDER — IBUPROFEN 400 MG PO TABS: 400.0000 mg | ORAL_TABLET | Freq: Four times a day (QID) | ORAL | Status: DC | PRN

## 2012-10-12 MED ORDER — NAPROXEN 500 MG PO TABS
500.0000 mg | ORAL_TABLET | Freq: Once | ORAL | Status: AC
  Administered 2012-10-12: 500 mg via ORAL
  Filled 2012-10-12: qty 1

## 2012-10-12 MED ORDER — ALBUTEROL SULFATE (5 MG/ML) 0.5% IN NEBU
2.5000 mg | INHALATION_SOLUTION | Freq: Once | RESPIRATORY_TRACT | Status: AC
  Administered 2012-10-12: 2.5 mg via RESPIRATORY_TRACT
  Filled 2012-10-12: qty 0.5

## 2012-10-12 MED ORDER — LORAZEPAM 1 MG PO TABS: 1.0000 mg | ORAL_TABLET | Freq: Once | ORAL | Status: DC

## 2012-10-12 MED ORDER — IOHEXOL 350 MG/ML SOLN
100.0000 mL | Freq: Once | INTRAVENOUS | Status: AC | PRN
  Administered 2012-10-12: 100 mL via INTRAVENOUS

## 2012-10-12 MED ORDER — ONDANSETRON 4 MG PO TBDP
4.0000 mg | ORAL_TABLET | Freq: Once | ORAL | Status: AC
  Administered 2012-10-12: 4 mg via ORAL
  Filled 2012-10-12: qty 1

## 2012-10-12 NOTE — ED Notes (Signed)
Pt states that she has been having CP and SOB x 3 weeks.  States this has happened before back in 2006.  Was at [facility] ER last Wednesday with same sx and not diagnosed with anything.  Describes it as a pressure over right chest.  Pt is tachypneic.

## 2012-10-12 NOTE — ED Provider Notes (Addendum)
History     CSN: [id number]  Arrival date & time 10/12/12  1125   First MD Initiated Contact with Patient 10/12/12 1138      Chief Complaint  Patient presents with  . Chest Pain  . Shortness of Breath    (Consider location/radiation/quality/duration/timing/severity/associated sxs/prior treatment) HPI Comments: Patient with PMH of lower extremity edema, HLD, and hypothyroidism presents complaining of 4 weeks of shortness of breath and intermittent right sided chest pain that is non-radiating and increasing in frequency. Patient states worse with exertion and relieved, somewhat, by rest. Patient admits to associated nausea. Denies vision changes or disturbances, cough, V/D, abdominal pain, fever, and chills. Denies new medications, recent travel, and hx of anxiety. Patient previously seen 1 wk ago in [facility] ED worked up with EKG, CBC, CMP, troponin x 2, BNP, D-dimer, CXR, b/l [place] of lower extremities all of which were unremarkable. Patient also has history of sleep apnea and uses CPAP. Admits to noncompliance, using it on and off; states has used it every night since Saturday.  [name] presenting with chest pain and shortness of breath. The history is provided by the patient. No language interpreter was used.  Chest Pain The chest pain began more  than 1 month ago. Chest pain occurs intermittently. The chest pain is worsening. The pain is associated with breathing and exertion. The severity of the pain is moderate. The quality of the pain is described as pressure-like. The pain does not radiate. Chest pain is worsened by deep breathing and exertion. Primary symptoms include shortness of breath and nausea. Pertinent negatives for primary symptoms include no fever, no syncope, no cough, no wheezing, no abdominal pain, no vomiting, no dizziness and no altered mental status.  The shortness of breath began more than 2 days ago. The shortness of breath developed gradually. The shortness  of breath is moderate. The patient's medical history does not include CHF, COPD or asthma.  Associated symptoms include lower extremity edema.  Pertinent negatives for associated symptoms include no diaphoresis. Associated symptoms comments: Pt has chronic lower extremity edema. She tried aspirin and proton pump inhibitors (given omeprazole on discharge 1 wk ago; non compliant as pt states makes her feel like she is going to faint) for the symptoms. Risk factors include lack of exercise and obesity.  Her past medical history is significant for hyperlipidemia and sleep apnea.  Pertinent negatives for past medical history include no DVT, no hypertension and no PE.    Shortness of Breath  Associated symptoms include chest pain and shortness of breath. Pertinent negatives include no fever, no rhinorrhea, no cough and no wheezing. Her past medical history does not include asthma.    Past Medical History  Diagnosis Date  . Hypothyroidism   . Edema   . Hyperlipidemia   . Pneumonia   . Vasovagal syncope     Past Surgical History  Procedure Date  . Appendectomy   . Nasal sinus surgery   . Mouth surgery     Family History  Problem Relation Age of Onset  . CAD    . Cancer    . Diabetes      History  Substance Use Topics  . Smoking status: Never Smoker   . Smokeless tobacco: Not on file  . Alcohol Use: No    OB History    Grav Para Term Preterm Abortions TAB SAB Ect Mult Living  Review of Systems  Constitutional: Negative for fever and diaphoresis.  HENT: Negative for congestion, rhinorrhea and tinnitus.   Eyes: Negative for photophobia and visual disturbance.  Respiratory: Positive for shortness of breath. Negative for cough and wheezing.   Cardiovascular: Positive for chest pain and leg swelling. Negative for syncope.  Gastrointestinal: Positive for nausea. Negative for vomiting, abdominal pain and diarrhea.  Genitourinary: Negative.   Skin: Negative for  color change and pallor.  Neurological: Negative for dizziness, syncope and light-headedness.  Psychiatric/Behavioral: Negative for altered mental status.    Allergies  Codeine; Hepatitis b virus vaccine; and Pneumococcal vaccines  Home Medications   Current Outpatient Rx  Name  Route  Sig  Dispense  Refill  . CALCIUM CARBONATE 1250 MG PO TABS   Oral   Take 1 tablet by mouth daily.         . CHLORTHALIDONE 25 MG PO TABS   Oral   Take 25 mg by mouth every evening.          Marland Kitchen OMEGA-3 FATTY ACIDS 1000 MG PO CAPS   Oral   Take 2 g by mouth daily.         Marland Kitchen LEVOTHYROXINE SODIUM 50 MCG PO TABS   Oral   Take 50 mcg by mouth daily before breakfast.          . ADULT MULTIVITAMIN W/MINERALS CH   Oral   Take 1 tablet by mouth daily.         Marland Kitchen VITAMIN B-12 1000 MCG PO TABS   Oral   Take 1,000 mcg by mouth daily.         Marland Kitchen VITAMIN C 500 MG PO TABS   Oral   Take 2,000 mg by mouth daily.          . IBUPROFEN 400 MG PO TABS   Oral   Take 1 tablet (400 mg total) by mouth every 6 (six) hours as needed for pain.   30 tablet   0     BP 126/70  Pulse 86  Temp 97.9 F (36.6 C) (Oral)  Resp 18  SpO2 100%  LMP 10/07/2012  Physical Exam  Nursing note and vitals reviewed. Constitutional: She is oriented to person, place, and time. No distress.  Morbidly obese  HENT:  Head: Normocephalic and atraumatic.  Right Ear: External ear normal.  Left Ear: External ear normal.  Eyes: Conjunctivae are normal. Pupils are equal, round, and reactive to light. No scleral icterus.  Neck: Normal range of motion.  Cardiovascular: Normal rate, regular rhythm and normal heart sounds.   Pulmonary/Chest: No stridor. No respiratory distress. She has no wheezes. She has no rales.  Tachypneic  Abdominal: Soft. There is no tenderness. There is no rebound.  Musculoskeletal: Normal range of motion.  Lymphadenopathy:    She has no cervical adenopathy.  Neurological: She is alert and  oriented to person, place, and time.  Skin: Skin is warm and dry. No rash noted. She is not diaphoretic. No erythema.  Psychiatric: Her behavior is normal.    ED Course  Procedures (including critical care time)  Labs Reviewed  BLOOD GAS, ARTERIAL - Abnormal; Notable for the following:    pH, Arterial 7.609 (*)     pCO2 arterial 20.3 (*)     pO2, Arterial 122.0 (*)     All other components within normal limits  POCT I-STAT, CHEM 8 - Abnormal; Notable for the following:    Potassium 3.4 (*)  Glucose, Bld 115 (*)     All other components within normal limits   Ct Angio Chest Pe W/cm &/or Wo Cm  10/12/2012  *RADIOLOGY REPORT*  Clinical Data: Chest pain, shortness of breath  CT ANGIOGRAPHY CHEST  Technique:  Multidetector CT imaging of the chest using the standard protocol during bolus administration of intravenous contrast. Multiplanar reconstructed images including MIPs were obtained and reviewed to evaluate the vascular anatomy.  Contrast: OMNIPAQUE IOHEXOL 350 MG/ML SOLN  Comparison: 10/05/2012 chest x-ray  Findings: Pulmonary arteries are well visualized contrast.  No filling defect or significant pulmonary embolus identified by CTA. Thoracic aorta appears intact.  Negative for aneurysm, dissection or mediastinal hematoma.  Normal heart size.  No pericardial or pleural effusion.  Negative for adenopathy.  Upper abdomen demonstrates no acute process.  Lung windows demonstrate scattered patchy nonspecific ground-glass attenuation throughout both lungs which could represent early mild edema versus volume overload or mild alveolitis.  Appearance is nonspecific.  Minimal basilar atelectasis.  No focal airspace consolidation, definite pneumonia, collapse, or central airway abnormality.  Trachea and central airways appear patent.  No osseous abnormality.  IMPRESSION: Negative for significant acute pulmonary embolus by CTA.  Low volume exam with scattered diffuse bilateral patchy ground- glass  attenuation which could represent early edema/volume overload or mild alveolitis.   Original Report Authenticated By: Judie Petit. Miles Costain, M.D.      Date: 10/12/2012  Rate: 93  Rhythm: normal sinus rhythm  QRS Axis: normal  Intervals: normal  ST/T Wave abnormalities: normal  Conduction Disutrbances:none  Narrative Interpretation: normal EKG  Old EKG Reviewed: unchanged  Arterial Blood Gas result:  pO2 122; pCO2 20.3; pH 7.609;  HCO3 20.5, %O2 Sat 99.4.   1. Shortness of breath   2. Hyperventilation       MDM  Patient c/o shortness of breath and right sided chest pain x 4 wks. Was seen and given extensive work up at [facility] including EKG, CBC, CMP, troponin x 2, BNP, D-dimer, CXR, b/l [place] of lower extremities all of which were unremarkable. Was given omeprazole and told to follow up with PCP. Management today included a neb treatment which provided only minor relief and naproxen for pain which also provided some relief. Work up included Chem-8, ABG, and CT angio of chest. ABGs significant for hyperventilation; patient has been tachypneic for much of stay today. CT angio was done to exclusively rule out PE, pneumonia, and rib fx; some patchy infiltration seen which could represent ? volume overload vs mild alveolitis. Patient is currently on diuretic for chronic lower extremity edema.  Have spoken patient regarding her work up and that we have ruled out any emergent pathology causing her symptoms. She has been told that the pain in her chest wall may be msk related and that she may take an antiinflammatory to try and improve symptoms. Patient has been instructed to follow with her PCP in the next 24-48 hours regarding her visit today and for further evaluation. Patient understands and is agreeable to discharge.   Filed Vitals:   10/12/12 1132 10/12/12 1521  BP: 118/58 126/70  Pulse: 76 86  Temp: 98.1 F (36.7 C) 97.9 F (36.6 C)  TempSrc: Oral Oral  Resp: 30 18  SpO2: 100% 100%           Antony Madura, PA-C 10/12/12 1543  Antony Madura, PA-C 10/27/12 1037

## 2012-10-15 NOTE — ED Provider Notes (Signed)
History/physical exam/procedure(s) were performed by non-physician practitioner and as supervising physician I was immediately available for consultation/collaboration. I have reviewed all notes and am in agreement with care and plan.   Hilario Quarry, MD 10/15/12 [phone]

## 2012-10-17 ENCOUNTER — Ambulatory Visit (HOSPITAL_COMMUNITY): Payer: BC Managed Care – PPO | Attending: Cardiology | Admitting: Radiology

## 2012-10-17 DIAGNOSIS — I10 Essential (primary) hypertension: Secondary | ICD-10-CM | POA: Insufficient documentation

## 2012-10-17 DIAGNOSIS — I369 Nonrheumatic tricuspid valve disorder, unspecified: Secondary | ICD-10-CM | POA: Insufficient documentation

## 2012-10-17 DIAGNOSIS — R0602 Shortness of breath: Secondary | ICD-10-CM

## 2012-10-17 DIAGNOSIS — R072 Precordial pain: Secondary | ICD-10-CM | POA: Insufficient documentation

## 2012-10-17 DIAGNOSIS — R0609 Other forms of dyspnea: Secondary | ICD-10-CM | POA: Insufficient documentation

## 2012-10-17 DIAGNOSIS — R079 Chest pain, unspecified: Secondary | ICD-10-CM

## 2012-10-17 DIAGNOSIS — R0989 Other specified symptoms and signs involving the circulatory and respiratory systems: Secondary | ICD-10-CM | POA: Insufficient documentation

## 2012-10-17 NOTE — Progress Notes (Signed)
Echocardiogram performed.  

## 2012-10-18 ENCOUNTER — Institutional Professional Consult (permissible substitution): Payer: BC Managed Care – PPO | Admitting: Cardiology

## 2012-10-19 ENCOUNTER — Encounter (HOSPITAL_COMMUNITY): Payer: Self-pay | Admitting: Emergency Medicine

## 2012-10-19 ENCOUNTER — Other Ambulatory Visit: Payer: Self-pay

## 2012-10-19 ENCOUNTER — Emergency Department (HOSPITAL_COMMUNITY)
Admission: EM | Admit: 2012-10-19 | Discharge: 2012-10-19 | Disposition: A | Discharge status: Home / Self Care | Payer: BC Managed Care – PPO | Attending: Emergency Medicine | Admitting: Emergency Medicine

## 2012-10-19 ENCOUNTER — Emergency Department (HOSPITAL_COMMUNITY): Payer: BC Managed Care – PPO

## 2012-10-19 DIAGNOSIS — R079 Chest pain, unspecified: Secondary | ICD-10-CM

## 2012-10-19 DIAGNOSIS — E785 Hyperlipidemia, unspecified: Secondary | ICD-10-CM | POA: Insufficient documentation

## 2012-10-19 DIAGNOSIS — Z8701 Personal history of pneumonia (recurrent): Secondary | ICD-10-CM | POA: Insufficient documentation

## 2012-10-19 DIAGNOSIS — E039 Hypothyroidism, unspecified: Secondary | ICD-10-CM | POA: Insufficient documentation

## 2012-10-19 DIAGNOSIS — Z8669 Personal history of other diseases of the nervous system and sense organs: Secondary | ICD-10-CM | POA: Insufficient documentation

## 2012-10-19 DIAGNOSIS — I10 Essential (primary) hypertension: Secondary | ICD-10-CM | POA: Insufficient documentation

## 2012-10-19 DIAGNOSIS — R0602 Shortness of breath: Secondary | ICD-10-CM | POA: Insufficient documentation

## 2012-10-19 DIAGNOSIS — Z79899 Other long term (current) drug therapy: Secondary | ICD-10-CM | POA: Insufficient documentation

## 2012-10-19 DIAGNOSIS — R0789 Other chest pain: Secondary | ICD-10-CM | POA: Insufficient documentation

## 2012-10-19 LAB — BASIC METABOLIC PANEL
CO2: 22 mEq/L (ref 19–32)
Chloride: 101 mEq/L (ref 96–112)
GFR calc non Af Amer: >90 mL/min (ref >90)
Glucose, Bld: 123 mg/dL — ABNORMAL HIGH (ref 70–99)
Potassium: 3.5 mEq/L (ref 3.5–5.1)
Sodium: 137 mEq/L (ref 135–145)

## 2012-10-19 LAB — TROPONIN I: Troponin I: <0.3 ng/mL (ref <0.30)

## 2012-10-19 LAB — CBC WITH DIFFERENTIAL/PLATELET
Lymphocytes Relative: 30 % (ref 12–46)
Lymphs Abs: 1.9 10*3/uL (ref 0.7–4.0)
Neutro Abs: 4.1 10*3/uL (ref 1.7–7.7)
Neutrophils Relative %: 62 % (ref 43–77)
Platelets: 264 10*3/uL (ref 150–400)
RBC: 3.84 MIL/uL — ABNORMAL LOW (ref 3.87–5.11)
WBC: 6.5 10*3/uL (ref 4.0–10.5)

## 2012-10-19 MED ORDER — SODIUM CHLORIDE 0.9 % IV SOLN
Freq: Once | INTRAVENOUS | Status: AC
  Administered 2012-10-19: 23:00:00 via INTRAVENOUS

## 2012-10-19 MED ORDER — KETOROLAC TROMETHAMINE 30 MG/ML IJ SOLN
30.0000 mg | Freq: Once | INTRAMUSCULAR | Status: AC
  Administered 2012-10-19: 30 mg via INTRAVENOUS
  Filled 2012-10-19: qty 1

## 2012-10-19 MED ORDER — TRAMADOL HCL 50 MG PO TABS: 50.0000 mg | ORAL_TABLET | Freq: Four times a day (QID) | ORAL | Status: DC | PRN

## 2012-10-19 NOTE — ED Notes (Signed)
Brought in by EMS from home with c/o shortness of breath with c/o substernal chest pain on breathing.  Per EMS, pt visited her PCP today for same problem; pt has these s/s ongoing for 1 month now; pt arrived to ED room A/Ox4, in no apparent distress.

## 2012-10-19 NOTE — ED Notes (Signed)
[id number]> Expected date:<BR> Expected time:<BR> Means of arrival:<BR> Comments:<BR> EMS [facility]

## 2012-10-19 NOTE — ED Provider Notes (Signed)
History     CSN: [id number]  Arrival date & time 10/19/12  2158   First MD Initiated Contact with Patient 10/19/12 2206      Chief Complaint  Patient presents with  . Shortness of Breath  . Chest Pain    (Consider location/radiation/quality/duration/timing/severity/associated sxs/prior treatment) HPI Comments: This is a 42 year old female, past medical history remarkable for hypertension, and fibromyalgia, who presents emergency department with chief complaint shortness of breath and chest pain. Patient has been seen multiple times for the same complaint. She states that the chest pain began one month ago. She states that it is intermittent in nature, and causes her to become short of breath. She denies any diaphoresis, or any exertional component to the chest pain. States the chest pain is moderate to severe in severity. She is tried taking ibuprofen with no relief. She denies any recent travel, recent surgery, history of blood clots. She does have family history of heart disease.  Patient has had repeated extensive workups including CT angio. She is referred by her primary care physician.  The history is provided by the patient. No language interpreter was used.    Past Medical History  Diagnosis Date  . Hypothyroidism   . Edema   . Hyperlipidemia   . Pneumonia   . Vasovagal syncope     Past Surgical History  Procedure Laterality Date  . Appendectomy    . Nasal sinus surgery    . Mouth surgery      Family History  Problem Relation Age of Onset  . CAD    . Cancer    . Diabetes      History  Substance Use Topics  . Smoking status: Never Smoker   . Smokeless tobacco: Not on file  . Alcohol Use: No    OB History   Grav Para Term Preterm Abortions TAB SAB Ect Mult Living                  Review of Systems  All other systems reviewed and are negative.    Allergies  Codeine; Hepatitis b virus vaccine; and Pneumococcal vaccines  Home Medications    Current Outpatient Rx  Name  Route  Sig  Dispense  Refill  . calcium carbonate (OS-CAL - DOSED IN MG OF ELEMENTAL CALCIUM) 1250 MG tablet   Oral   Take 1 tablet by mouth daily.         . chlorthalidone (HYGROTON) 25 MG tablet   Oral   Take 25 mg by mouth every evening.          . fish oil-omega-3 fatty acids 1000 MG capsule   Oral   Take 2 g by mouth daily.         Marland Kitchen ibuprofen (ADVIL,MOTRIN) 400 MG tablet   Oral   Take 1 tablet (400 mg total) by mouth every 6 (six) hours as needed for pain.   30 tablet   0   . levothyroxine (SYNTHROID, LEVOTHROID) 50 MCG tablet   Oral   Take 50 mcg by mouth daily before breakfast.          . Multiple Vitamin (MULTIVITAMIN WITH MINERALS) TABS   Oral   Take 1 tablet by mouth daily.         . vitamin B-12 (CYANOCOBALAMIN) 1000 MCG tablet   Oral   Take 1,000 mcg by mouth daily.         . vitamin C (ASCORBIC ACID) 500 MG tablet   Oral  Take 2,000 mg by mouth daily.            BP 119/76  Pulse 81  Temp(Src) 97.8 F (36.6 C) (Oral)  Resp 18  SpO2 100%  LMP 10/07/2012  Physical Exam  Nursing note and vitals reviewed. Constitutional: She is oriented to person, place, and time. She appears well-developed and well-nourished.  HENT:  Head: Normocephalic and atraumatic.  Eyes: Conjunctivae and EOM are normal. Pupils are equal, round, and reactive to light.  Neck: Normal range of motion. Neck supple.  Cardiovascular: Normal rate and regular rhythm.  Exam reveals no gallop and no friction rub.   No murmur heard. Pulmonary/Chest: Effort normal and breath sounds normal. No respiratory distress. She has no wheezes. She has no rales. She exhibits no tenderness.  Clear to auscultation bilaterally, right chest wall is tender to palpation  Abdominal: Soft. Bowel sounds are normal. She exhibits no distension and no mass. There is no tenderness. There is no rebound and no guarding.  Musculoskeletal: Normal range of motion. She  exhibits no edema and no tenderness.  Tender to palpation over the left rhomboid and trapezius muscle bodies  Neurological: She is alert and oriented to person, place, and time.  Skin: Skin is warm and dry.  Psychiatric: She has a normal mood and affect. Her behavior is normal. Judgment and thought content normal.    ED Course  Procedures (including critical care time)  Labs Reviewed  CBC WITH DIFFERENTIAL  BASIC METABOLIC PANEL  PRO B NATRIURETIC PEPTIDE  TROPONIN I   Results for orders placed during the hospital encounter of 10/19/12  CBC WITH DIFFERENTIAL      Result Value Range   WBC 6.5  4.0 - 10.5 K/uL   RBC 3.84 (*) 3.87 - 5.11 MIL/uL   Hemoglobin 12.4  12.0 - 15.0 g/dL   HCT 16.1 (*) 09.6 - 04.5 %   MCV 91.7  78.0 - 100.0 fL   MCH 32.3  26.0 - 34.0 pg   MCHC 35.2  30.0 - 36.0 g/dL   RDW 40.9  81.1 - 91.4 %   Platelets 264  150 - 400 K/uL   Neutrophils Relative 62  43 - 77 %   Neutro Abs 4.1  1.7 - 7.7 K/uL   Lymphocytes Relative 30  12 - 46 %   Lymphs Abs 1.9  0.7 - 4.0 K/uL   Monocytes Relative 4  3 - 12 %   Monocytes Absolute 0.3  0.1 - 1.0 K/uL   Eosinophils Relative 3  0 - 5 %   Eosinophils Absolute 0.2  0.0 - 0.7 K/uL   Basophils Relative 1  0 - 1 %   Basophils Absolute 0.0  0.0 - 0.1 K/uL  BASIC METABOLIC PANEL      Result Value Range   Sodium 137  135 - 145 mEq/L   Potassium 3.5  3.5 - 5.1 mEq/L   Chloride 101  96 - 112 mEq/L   CO2 22  19 - 32 mEq/L   Glucose, Bld 123 (*) 70 - 99 mg/dL   BUN 16  6 - 23 mg/dL   Creatinine, Ser 7.82  0.50 - 1.10 mg/dL   Calcium 9.5  8.4 - 95.6 mg/dL   GFR calc non Af Amer >90  >90 mL/min   GFR calc Af Amer >90  >90 mL/min  PRO B NATRIURETIC PEPTIDE      Result Value Range   Pro B Natriuretic peptide (BNP) 40.6  0 -  125 pg/mL  TROPONIN I      Result Value Range   Troponin I <0.30  <0.30 ng/mL   Dg Chest 2 View  10/19/2012  *RADIOLOGY REPORT*  Clinical Data: Chest pain  CHEST - 2 VIEW  Comparison: 10/12/2012   Findings: Low lung volumes are present, causing crowding of the pulmonary vasculature.  Cardiac and mediastinal contours appear normal.  The lungs appear clear.  No pleural effusion is identified.  IMPRESSION:  No significant abnormality identified.   Original Report Authenticated By: Gaylyn Rong, M.D.    Ct Angio Chest Pe W/cm &/or Wo Cm  10/12/2012  *RADIOLOGY REPORT*  Clinical Data: Chest pain, shortness of breath  CT ANGIOGRAPHY CHEST  Technique:  Multidetector CT imaging of the chest using the standard protocol during bolus administration of intravenous contrast. Multiplanar reconstructed images including MIPs were obtained and reviewed to evaluate the vascular anatomy.  Contrast: OMNIPAQUE IOHEXOL 350 MG/ML SOLN  Comparison: 10/05/2012 chest x-ray  Findings: Pulmonary arteries are well visualized contrast.  No filling defect or significant pulmonary embolus identified by CTA. Thoracic aorta appears intact.  Negative for aneurysm, dissection or mediastinal hematoma.  Normal heart size.  No pericardial or pleural effusion.  Negative for adenopathy.  Upper abdomen demonstrates no acute process.  Lung windows demonstrate scattered patchy nonspecific ground-glass attenuation throughout both lungs which could represent early mild edema versus volume overload or mild alveolitis.  Appearance is nonspecific.  Minimal basilar atelectasis.  No focal airspace consolidation, definite pneumonia, collapse, or central airway abnormality.  Trachea and central airways appear patent.  No osseous abnormality.  IMPRESSION: Negative for significant acute pulmonary embolus by CTA.  Low volume exam with scattered diffuse bilateral patchy ground- glass attenuation which could represent early edema/volume overload or mild alveolitis.   Original Report Authenticated By: Judie Petit. Miles Costain, M.D.    US Venous Img Lower Bilateral  10/05/2012  *RADIOLOGY REPORT*  Clinical Data: Lower extremity edema, chronic fatigue and swelling  concerning for venous insufficiency  BILATERAL LOWER EXTREMITY VENOUS DUPLEX ULTRASOUND  Technique:  Gray-scale sonography with graded compression, as well as color Doppler and duplex ultrasound, were performed to evaluate the deep and superficial veins of both lower extremities.  Spectral Doppler was utilized to evaluate flow at rest and with distal augmentation maneuvers.  A complete superficial venous insufficiency exam was performed in the upright standing position. I personally performed the technical portion of the exam.  Comparison:  None.  Findings:  Right Lower Extremity:  The right saphenofemoral junction is patent and normal.  No evidence of venous insufficiency.  Right G S V is normal in caliber and remains patent without reflux or insufficiency.  Right small saphenous vein is also normal in caliber without reflux.  Right lower extremity deep veins are patent and compressible with augmentation and normal phasicity. Negative for DVT.  Left Lower Extremity:  The left saphenofemoral junction, great saphenous vein, and small saphenous vein are also all normal without venous insufficiency or reflux.  No sub surface varicosities.  The left femoral and popliteal deep veins demonstrate normal phasicity, augmentation and compressibility without DVT.  IMPRESSION: Normal G S V and S S V anatomy bilaterally.  Negative for superficial saphenous reflux or sub surface varicosities in either extremity.  Negative for DVT.   Original Report Authenticated By: Judie Petit. Miles Costain, M.D.    Dg Chest Port 1 View  10/05/2012  *RADIOLOGY REPORT*  Clinical Data: Chest pain.  PORTABLE CHEST - 1 VIEW  Comparison: 05/14/2009 chest radiograph  Findings: The cardiomediastinal silhouette is unremarkable. There is no evidence of focal airspace disease, pulmonary edema, suspicious pulmonary nodule/mass, pleural effusion, or pneumothorax. No acute bony abnormalities are identified.  IMPRESSION: No evidence of active cardiopulmonary disease.    Original Report Authenticated By: Harmon Pier, M.D.    [facility] Radiologist Eval & Mgmt  10/05/2012  *RADIOLOGY REPORT*  NEW PATIENT OFFICE VISIT - LEVEL III [phone])  Chief Complaint:  Chronic bilateral lower extremity edema, fatigue, leg pain, dermatitis.  Evaluate for venous insufficiency.  History:  42 year old female with chronic bilateral lower extremity swelling, fatigue and pain.  She has chronic lower calf and ankle mild dermatitis changes with eczema and redness.  No history of active bleeding or ulceration.  She has had an extensive workup with previous venous Doppler studies demonstrate no evidence of DVT.  She says she has had arterial exams demonstrate no evidence of peripheral vascular disease.  She has been evaluated by vascular surgery and cardiology.  She now presents for evaluation for venous insufficiency.  She reports chronic leg pain, fatigue and heaviness.  This worsens throughout the day.  She has a fairly sedentary life style and works as a Surveyor, mining.  She has been intermittently using compression stockings with no significant improvement.  Past Medical History:  Migraine headaches, vasovagal syncope, fibromyalgia, thyroid disease  Medications:  Levo thyroxine 50 mcg daily, Caltrate D 600 daily, Fish oil 1200 4 po qd, oscal 1 po qd, vit B12 and C daily  Allergies:  Codeine, Hep B vaccine, pneumococcal vaccine, immtrex  Social History:  Negative for alcohol or drug use.  Current school bus driver.  No tobacco use.  Daily caffeine intake.  Family History:  Positive for ovarian cancer in her mother. Maternal aunt with breast cancer.  Review of Systems:  No shortness of breath, nausea, diaphoresis, dizziness.  Mild epigastric discomfort, chronic.  Chronic lower extremity edema and symptoms as above.  Exam:  Afebrile, vital signs stable. General:  No acute distress.  Mildly obese. Affect: appropriate.  Lower extremities:  Mild symmetric +2 pedal edema.  Lower calf and ankle mild redness,  eczema, and skin thickening noted.  Pattern is consistent with dermatitis. Pulses:  Normal symmetric dorsalis pedis pulses.  Normal capillary refill.  Today, I spent approximate 30 minutes with the patient and reviewed the patients physical exam and ultrasound findings.  Ultrasound was dictated separately but confirms normal saphenous anatomy bilaterally.  No significant venous insufficiency or sub surface varicosities.  Negative for DVT.  Therefore, she does not need any transcatheter laser venous treatments, sclerotherapy, or vein removal.  Her lower extremity venous anatomy is normal.  She appears to have chronic lower extremity edema/dermatitis.  I suspect this is multifactorial but predominantly related to her obesity and sedentary life style.  I would recommend a conservative treatment plan with daily knee high prescription strength compression stockings (20/30 mmHg).  Daily walking and initiating a weight management program.  These findings were discussed at length with the patient.  She has a clear understanding.  Certainly if any visible varicosities develop she could be reevaluated as-needed.  Assessment and Plan:  Nonspecific symmetric lower extremity edema with chronic calf and ankle mild dermatitis, suspect related to obesity and sedentary life style.  Plan:  20/30 mmHg compression stockings. Daily exercise Initiate weight management program   Original Report Authenticated By: Judie Petit. Miles Costain, M.D.     ED ECG REPORT  I personally interpreted this EKG   Date: 10/19/2012   Rate:  82  Rhythm: normal sinus rhythm  QRS Axis: normal  Intervals: normal  ST/T Wave abnormalities: normal  Conduction Disutrbances:none  Narrative Interpretation:   Old EKG Reviewed: unchanged    1. Chest pain       MDM  42 year old female with chest pain times one month. She has had several workups for the same complaint. Workup today is unremarkable for acute or emergent process. I'm going to discharge the patient to  home with primary care followup. Patient states that her pain has been improved during her ED course and after receiving Toradol.  I suspect that the patient's pain is related to her fibromyalgia, or perhaps there might even be an underlying psychiatric component.  Patient has had stable vitals her entire stay here.  She is stable and ready for discharge.        Roxy Horseman, PA-C 10/19/12 2324

## 2012-10-20 NOTE — ED Provider Notes (Signed)
Medical screening examination/treatment/procedure(s) were performed by non-physician practitioner and as supervising physician I was immediately available for consultation/collaboration.   Oday Ridings L Taffie Eckmann, MD 10/20/12 2244 

## 2012-10-24 ENCOUNTER — Encounter (HOSPITAL_COMMUNITY): Payer: Self-pay | Admitting: Emergency Medicine

## 2012-10-24 ENCOUNTER — Emergency Department (HOSPITAL_COMMUNITY)
Admission: EM | Admit: 2012-10-24 | Discharge: 2012-10-24 | Disposition: A | Discharge status: Home / Self Care | Payer: BC Managed Care – PPO | Attending: Emergency Medicine | Admitting: Emergency Medicine

## 2012-10-24 DIAGNOSIS — Z8701 Personal history of pneumonia (recurrent): Secondary | ICD-10-CM | POA: Insufficient documentation

## 2012-10-24 DIAGNOSIS — Z8739 Personal history of other diseases of the musculoskeletal system and connective tissue: Secondary | ICD-10-CM | POA: Insufficient documentation

## 2012-10-24 DIAGNOSIS — M7989 Other specified soft tissue disorders: Secondary | ICD-10-CM | POA: Insufficient documentation

## 2012-10-24 DIAGNOSIS — R0609 Other forms of dyspnea: Secondary | ICD-10-CM | POA: Insufficient documentation

## 2012-10-24 DIAGNOSIS — R0989 Other specified symptoms and signs involving the circulatory and respiratory systems: Secondary | ICD-10-CM | POA: Insufficient documentation

## 2012-10-24 DIAGNOSIS — Z79899 Other long term (current) drug therapy: Secondary | ICD-10-CM | POA: Insufficient documentation

## 2012-10-24 DIAGNOSIS — R0600 Dyspnea, unspecified: Secondary | ICD-10-CM

## 2012-10-24 DIAGNOSIS — R51 Headache: Secondary | ICD-10-CM | POA: Insufficient documentation

## 2012-10-24 DIAGNOSIS — Z8639 Personal history of other endocrine, nutritional and metabolic disease: Secondary | ICD-10-CM | POA: Insufficient documentation

## 2012-10-24 DIAGNOSIS — E039 Hypothyroidism, unspecified: Secondary | ICD-10-CM | POA: Insufficient documentation

## 2012-10-24 DIAGNOSIS — R079 Chest pain, unspecified: Secondary | ICD-10-CM | POA: Insufficient documentation

## 2012-10-24 DIAGNOSIS — Z862 Personal history of diseases of the blood and blood-forming organs and certain disorders involving the immune mechanism: Secondary | ICD-10-CM | POA: Insufficient documentation

## 2012-10-24 LAB — POCT I-STAT TROPONIN I: Troponin i, poc: 0 ng/mL (ref 0.00–0.08)

## 2012-10-24 LAB — BASIC METABOLIC PANEL
CO2: 25 mEq/L (ref 19–32)
Chloride: 101 mEq/L (ref 96–112)
Glucose, Bld: 105 mg/dL — ABNORMAL HIGH (ref 70–99)
Potassium: 3.9 mEq/L (ref 3.5–5.1)
Sodium: 138 mEq/L (ref 135–145)

## 2012-10-24 LAB — CBC
Hemoglobin: 12.8 g/dL (ref 12.0–15.0)
RBC: 3.97 MIL/uL (ref 3.87–5.11)

## 2012-10-24 MED ORDER — GI COCKTAIL ~~LOC~~
30.0000 mL | Freq: Once | ORAL | Status: AC
  Administered 2012-10-24: 30 mL via ORAL
  Filled 2012-10-24: qty 30

## 2012-10-24 MED ORDER — KETOROLAC TROMETHAMINE 30 MG/ML IJ SOLN
INTRAMUSCULAR | Status: AC
  Administered 2012-10-24: 30 mg
  Filled 2012-10-24: qty 1

## 2012-10-24 NOTE — ED Provider Notes (Signed)
History     CSN: [id number]  Arrival date & time 10/24/12  1337   First MD Initiated Contact with Patient 10/24/12 1458      Chief Complaint  Patient presents with  . Chest Pain    (Consider location/radiation/quality/duration/timing/severity/associated sxs/prior treatment) HPI Comments: 42 year old female with a history of hypothyroidism, mild hypercholesterolemia who has had chronic complaints of pain including headaches, abdominal pain and over the last month she has had chest pain. This chest pain seems to be diffusely across her chest, it is worse with any specific motion including standing up to shower, down to fix herself a meal, walking and is associated with dyspnea. She has swelling of her bilateral lower extremities and has been evaluated with Doppler studies which have been negative for venous thrombosis, she has had a CT angiogram approximately 2 weeks ago which was negative for pulmonary embolism and had an echocardiogram this week which was negative for any wall motion abnormalities, normal ejection fraction possible grade 2 diastolic dysfunction. There was no evidence of abnormal central venous pressure during that study either. Today the patient states that her pain is constant, she is tired of dealing with it, she has no cough fever abdominal pain or back pain today and has no sore throat nasal congestion headache or blurred vision. She is to see the pulmonologist on Thursday.  Cardiologist is Dr. Jens Som. She had a coronary catheterization in 2006 which was negative for obstructive disease.  [name] presenting with chest pain. The history is provided by the patient, medical records and a relative.  Chest Pain   Past Medical History  Diagnosis Date  . Hypothyroidism   . Edema   . Hyperlipidemia   . Pneumonia   . Vasovagal syncope     Past Surgical History  Procedure Laterality Date  . Appendectomy    . Nasal sinus surgery    . Mouth surgery       Family History  Problem Relation Age of Onset  . CAD    . Cancer    . Diabetes      History  Substance Use Topics  . Smoking status: Never Smoker   . Smokeless tobacco: Not on file  . Alcohol Use: No    OB History   Grav Para Term Preterm Abortions TAB SAB Ect Mult Living                  Review of Systems  Cardiovascular: Positive for chest pain.  All other systems reviewed and are negative.    Allergies  Codeine; Hepatitis b virus vaccine; and Pneumococcal vaccines  Home Medications   Current Outpatient Rx  Name  Route  Sig  Dispense  Refill  . albuterol (PROVENTIL HFA;VENTOLIN HFA) 108 (90 BASE) MCG/ACT inhaler   Inhalation   Inhale 2 puffs into the lungs every 6 (six) hours as needed for wheezing.         . calcium carbonate (OS-CAL - DOSED IN MG OF ELEMENTAL CALCIUM) 1250 MG tablet   Oral   Take 1 tablet by mouth every morning.          . chlorthalidone (HYGROTON) 25 MG tablet   Oral   Take 25 mg by mouth every evening.          . fish oil-omega-3 fatty acids 1000 MG capsule   Oral   Take 2 g by mouth every morning.          Marland Kitchen ibuprofen (  ADVIL,MOTRIN) 400 MG tablet   Oral   Take 400 mg by mouth every 6 (six) hours as needed for pain.         Marland Kitchen levothyroxine (SYNTHROID, LEVOTHROID) 50 MCG tablet   Oral   Take 50 mcg by mouth daily before breakfast.          . mometasone-formoterol (DULERA) 200-5 MCG/ACT AERO   Inhalation   Inhale 1 puff into the lungs 2 (two) times daily.         . Multiple Vitamin (MULTIVITAMIN WITH MINERALS) TABS   Oral   Take 1 tablet by mouth every morning.          . vitamin B-12 (CYANOCOBALAMIN) 1000 MCG tablet   Oral   Take 1,000 mcg by mouth every morning.          . vitamin C (ASCORBIC ACID) 500 MG tablet   Oral   Take 2,000 mg by mouth every morning.          . traMADol (ULTRAM) 50 MG tablet   Oral   Take 1 tablet (50 mg total) by mouth every 6 (six) hours as needed for pain.   15  tablet   0     BP 153/78  Pulse 92  Temp(Src) 98.4 F (36.9 C) (Oral)  SpO2 99%  LMP 10/07/2012  Physical Exam  Nursing note and vitals reviewed. Constitutional: She appears well-developed and well-nourished. No distress.  HENT:  Head: Normocephalic and atraumatic.  Mouth/Throat: Oropharynx is clear and moist. No oropharyngeal exudate.  Eyes: Conjunctivae and EOM are normal. Pupils are equal, round, and reactive to light. Right eye exhibits no discharge. Left eye exhibits no discharge. No scleral icterus.  Neck: Normal range of motion. Neck supple. No JVD present. No thyromegaly present.  Cardiovascular: Normal rate, regular rhythm, normal heart sounds and intact distal pulses.  Exam reveals no gallop and no friction rub.   No murmur heard. Pulmonary/Chest: Effort normal and breath sounds normal. No respiratory distress. She has no wheezes. She has no rales.  Abdominal: Soft. Bowel sounds are normal. She exhibits no distension and no mass. There is no tenderness.  Musculoskeletal: Normal range of motion. She exhibits no edema and no tenderness.  Lymphadenopathy:    She has no cervical adenopathy.  Neurological: She is alert. Coordination normal.  Skin: Skin is warm and dry. No rash noted. No erythema.  Psychiatric: She has a normal mood and affect. Her behavior is normal.    ED Course  Procedures (including critical care time)  Labs Reviewed  BASIC METABOLIC PANEL - Abnormal; Notable for the following:    Glucose, Bld 105 (*)    All other components within normal limits  CBC  PRO B NATRIURETIC PEPTIDE  POCT I-STAT TROPONIN I   No results found.   1. Chest pain   2. Dyspnea       MDM  While the patient appears uncomfortable she has a totally normal exam without murmurs, no JVD, no asymmetry to the lower extremities bilaterally. She has a soft abdomen without tenderness, moist mucous membranes. She has had every study in the acute setting that would be required to  rule out significant sources of her symptoms. Her EKG is totally normal, labs pending, GI cocktail, discuss with cardiology. The patient does appear stable at this time to followup in outpatient setting. I do not know the etiology of the patient's pain but it is unlikely that this is related to venous thrombosis or acute  coronary syndrome.  ED ECG REPORT  I personally interpreted this EKG   Date: 10/24/2012   Rate: 87  Rhythm: normal sinus rhythm  QRS Axis: normal  Intervals: normal  ST/T Wave abnormalities: normal  Conduction Disutrbances:none  Narrative Interpretation:   Old EKG Reviewed: unchanged  Discussed the patient's care with cardiology was in complete agreement that the patient can followup in the office and with pulmonology on Thursday. Review of all of her records show that there is no definite etiology to her pain specifically doubt cardiac etiology, pulmonary hypertension, pulmonary embolism. Discussed with cardiology who will make followup appointment for the patient with Dr. Jens Som.   The patient has been reevaluated, no significant improvement after a GI cocktail, patient stable for discharge    Vida Roller, MD 10/24/12 [phone]

## 2012-10-24 NOTE — ED Notes (Signed)
Pt presenting to ed with c/o chest pain, shortness of breath with positive nausea no vomiting x 3 weeks. Pt state she has made several trips to this ER and she has seen her Pcp but the pain continues.

## 2012-10-24 NOTE — ED Notes (Signed)
o2 sat 99% on RA.  Pt request to be put on o2 despite 02 sat.

## 2012-10-26 ENCOUNTER — Encounter: Payer: Self-pay | Admitting: Physician Assistant

## 2012-10-26 ENCOUNTER — Emergency Department (HOSPITAL_COMMUNITY): Payer: BC Managed Care – PPO

## 2012-10-26 ENCOUNTER — Emergency Department (HOSPITAL_COMMUNITY)
Admission: EM | Admit: 2012-10-26 | Discharge: 2012-10-26 | Disposition: A | Discharge status: Home / Self Care | Payer: BC Managed Care – PPO | Attending: Emergency Medicine | Admitting: Emergency Medicine

## 2012-10-26 ENCOUNTER — Ambulatory Visit (INDEPENDENT_AMBULATORY_CARE_PROVIDER_SITE_OTHER): Payer: BC Managed Care – PPO | Admitting: Physician Assistant

## 2012-10-26 ENCOUNTER — Encounter (HOSPITAL_COMMUNITY): Payer: Self-pay | Admitting: Emergency Medicine

## 2012-10-26 VITALS — BP 108/68 | HR 97 | Ht 62.0 in | Wt 190.8 lb

## 2012-10-26 DIAGNOSIS — Z79899 Other long term (current) drug therapy: Secondary | ICD-10-CM | POA: Insufficient documentation

## 2012-10-26 DIAGNOSIS — Z8639 Personal history of other endocrine, nutritional and metabolic disease: Secondary | ICD-10-CM | POA: Insufficient documentation

## 2012-10-26 DIAGNOSIS — E039 Hypothyroidism, unspecified: Secondary | ICD-10-CM | POA: Insufficient documentation

## 2012-10-26 DIAGNOSIS — Z862 Personal history of diseases of the blood and blood-forming organs and certain disorders involving the immune mechanism: Secondary | ICD-10-CM | POA: Insufficient documentation

## 2012-10-26 DIAGNOSIS — R0789 Other chest pain: Secondary | ICD-10-CM | POA: Insufficient documentation

## 2012-10-26 DIAGNOSIS — Z8701 Personal history of pneumonia (recurrent): Secondary | ICD-10-CM | POA: Insufficient documentation

## 2012-10-26 LAB — POCT I-STAT TROPONIN I: Troponin i, poc: 0 ng/mL (ref 0.00–0.08)

## 2012-10-26 LAB — POCT I-STAT, CHEM 8
Calcium, Ion: 1.21 mmol/L (ref 1.12–1.23)
Chloride: 108 mEq/L (ref 96–112)
HCT: 34 % — ABNORMAL LOW (ref 36.0–46.0)
Potassium: 3.9 mEq/L (ref 3.5–5.1)
Sodium: 141 mEq/L (ref 135–145)

## 2012-10-26 MED ORDER — LORAZEPAM 1 MG PO TABS
1.0000 mg | ORAL_TABLET | Freq: Once | ORAL | Status: AC
  Administered 2012-10-26: 1 mg via ORAL
  Filled 2012-10-26: qty 1

## 2012-10-26 MED ORDER — PANTOPRAZOLE SODIUM 40 MG PO TBEC: 40.0000 mg | DELAYED_RELEASE_TABLET | Freq: Every day | ORAL | Status: DC

## 2012-10-26 MED ORDER — KETOROLAC TROMETHAMINE 30 MG/ML IJ SOLN
30.0000 mg | Freq: Once | INTRAMUSCULAR | Status: AC
  Administered 2012-10-26: 30 mg via INTRAVENOUS
  Filled 2012-10-26: qty 1

## 2012-10-26 MED ORDER — HYDROCODONE-ACETAMINOPHEN 5-325 MG PO TABS: 1.0000 | ORAL_TABLET | Freq: Four times a day (QID) | ORAL | Status: DC | PRN

## 2012-10-26 NOTE — Discharge Instructions (Signed)
Return here as needed. Follow up with the cardiologist for further eval.

## 2012-10-26 NOTE — Patient Instructions (Addendum)
Your physician has recommended you make the following change in your medication: START PROTONIX 40 MG DAILY  TAKE OTC IBUPROFEN 400 MG EVERY 6-8 HOURS FOR 1-2 WEEKS  Your physician has requested that you have a lexiscan myoview DX 786.50. For further information please visit [email or web address] Please follow instruction sheet, as given.   KEEP YOUR FOLLOW UP WITH DR. [place] 11/10/12 @ 10:15  KEEP YOUR APPT 10/27/12 9:30 WITH DR. Sherene Sires

## 2012-10-26 NOTE — ED Provider Notes (Signed)
History     CSN: [id number]  Arrival date & time 10/26/12  0900   First MD Initiated Contact with Patient 10/26/12 0912      Chief Complaint  Patient presents with  . Chest Pain    (Consider location/radiation/quality/duration/timing/severity/associated sxs/prior treatment) HPI Patient is a 42 year old female with a history of hypothyroidism, hyperlipidemia and edema who presents with chest pain.  The patient was here two days ago for the same complaint.  It is described as a sharp pain that is constant from her xiphoid area radiating to her neck and left side of the chest.  It is worsened with any movement including deep breaths and coughing.  She feels like it's getting worse and is concerned about having an MI and so that's why she came back today.  Has shortness of breath.  Denies any abdominal pain, nausea, vomiting, dysuria, hematuria.    Past Medical History  Diagnosis Date  . Hypothyroidism   . Edema   . Hyperlipidemia   . Pneumonia   . Vasovagal syncope     Past Surgical History  Procedure Laterality Date  . Appendectomy    . Nasal sinus surgery    . Mouth surgery      Family History  Problem Relation Age of Onset  . CAD    . Cancer    . Diabetes      History  Substance Use Topics  . Smoking status: Never Smoker   . Smokeless tobacco: Not on file  . Alcohol Use: No    OB History   Grav Para Term Preterm Abortions TAB SAB Ect Mult Living                  Review of Systems All other systems negative except as documented in the HPI. All pertinent positives and negatives as reviewed in the HPI.  Allergies  Codeine; Hepatitis b virus vaccine; and Pneumococcal vaccines  Home Medications   Current Outpatient Rx  Name  Route  Sig  Dispense  Refill  . albuterol (PROVENTIL HFA;VENTOLIN HFA) 108 (90 BASE) MCG/ACT inhaler   Inhalation   Inhale 2 puffs into the lungs every 6 (six) hours as needed for wheezing.         . calcium carbonate (OS-CAL -  DOSED IN MG OF ELEMENTAL CALCIUM) 1250 MG tablet   Oral   Take 1 tablet by mouth every morning.          . chlorthalidone (HYGROTON) 25 MG tablet   Oral   Take 25 mg by mouth every evening.          . fish oil-omega-3 fatty acids 1000 MG capsule   Oral   Take 2 g by mouth every morning.          Marland Kitchen ibuprofen (ADVIL,MOTRIN) 400 MG tablet   Oral   Take 400 mg by mouth every 6 (six) hours as needed for pain.         Marland Kitchen levothyroxine (SYNTHROID, LEVOTHROID) 50 MCG tablet   Oral   Take 50 mcg by mouth daily before breakfast.          . mometasone-formoterol (DULERA) 200-5 MCG/ACT AERO   Inhalation   Inhale 1 puff into the lungs 2 (two) times daily.         . Multiple Vitamin (MULTIVITAMIN WITH MINERALS) TABS   Oral   Take 1 tablet by mouth every morning.          . vitamin  B-12 (CYANOCOBALAMIN) 1000 MCG tablet   Oral   Take 1,000 mcg by mouth every morning.          . vitamin C (ASCORBIC ACID) 500 MG tablet   Oral   Take 2,000 mg by mouth every morning.          . traMADol (ULTRAM) 50 MG tablet   Oral   Take 1 tablet (50 mg total) by mouth every 6 (six) hours as needed for pain.   15 tablet   0     BP 120/71  Pulse 77  Temp(Src) 98.2 F (36.8 C) (Oral)  Resp 15  SpO2 100%  LMP 10/07/2012  Physical Exam  Constitutional: She is oriented to person, place, and time. She appears well-developed and well-nourished.  HENT:  Head: Normocephalic and atraumatic.  Eyes: EOM are normal. Right eye exhibits no discharge. Left eye exhibits no discharge.  Neck: Normal range of motion.  Cardiovascular: Normal rate, regular rhythm and normal heart sounds.  Exam reveals no gallop.   No murmur heard. Pulmonary/Chest: Effort normal and breath sounds normal. No respiratory distress. She has no wheezes. She has no rales. She exhibits no tenderness.    Tenderness is reproducible with palpation   Abdominal: Soft. She exhibits no distension. There is no tenderness.  There is no rebound and no guarding.  Neurological: She is alert and oriented to person, place, and time.  Skin: No rash noted. No erythema. No pallor.  Psychiatric: She has a normal mood and affect. Her behavior is normal. Judgment and thought content normal.    ED Course  Procedures (including critical care time)  The patient has been seen multiple times for this diffuse chest discomfort. Has had extensive work up for this. The patient has a cardiology appointment today as well. The patient is stable here. The patient is explained that this could still be an evolving process or a cardiac issue but that we are not finding any acute issues here and that the follow up is vastly important. There could also be a small component of anxiety involved with this issue.    MDM   Date: 10/29/2012  Rate: 80  Rhythm: normal sinus rhythm  QRS Axis: normal  Intervals: normal  ST/T Wave abnormalities: normal  Conduction Disutrbances:none  Narrative Interpretation:   Old EKG Reviewed: unchanged          Carlyle Dolly, PA-C 10/29/12 1201

## 2012-10-26 NOTE — Progress Notes (Signed)
[id number], [address] [place], [place]  [address] Phone: [phone], Fax:  [phone]  Date:  10/26/2012   ID:  Destiny Sharp, DOB Dec 10, 1970, MRN [id number]  PCP:  Herb Grays, MD  Primary Cardiologist:  Dr. Peter Swaziland     History of Present Illness: Destiny Sharp is a 42 y.o. female who returns for evaluation of chest pain.  She has a long hx of chest pain and vagal syncope.  LHC 6/06 by Dr. Swaziland with normal coronary arteries.  Carotid dopplers 2010: 20-39% bilat ICA.  She has had multiple trips to the ED for chest pain.  Saw Dr. Jens Som last month after trip to the ED.  CXR, venous dopplers, DDimer, BNP all unremarkable.  Troponin was normal.  Follow up was recommended as needed.  Echo 2/14:  EF 55-60%, grade 2 diast dysfn, MAC.  She has been to the ED 3 times since last seen here.  She just left the ED after going there this AM to come see me today.  She reports chest pain and shortness of breath for the past 3 weeks. It has been fairly constant without much relief. She points to her epigastrium. It radiates up into her left and right chest. She describes it is heavy/tight/stabbing. She notes that it is worse with activity. She also notes associated dyspnea. She describes NYHA class III symptoms. She denies orthopnea, PND. She has chronic LE edema. This appears to be related to venous insufficiency. She denies syncope or near-syncope. 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  Labs (2/14):  K 3.9, creatinine 0.60, BNP 38, Hgb 11.6  Chest CTA 10/12/12: IMPRESSION:  Negative for significant acute pulmonary embolus by CTA.  Low volume exam with scattered diffuse bilateral patchy ground-glass attenuation which could represent early edema/volume overload or mild alveolitis.  CXR 10/26/12: IMPRESSION:  No active  cardiopulmonary disease.  Wt Readings from Last 3 Encounters:  10/26/12 190 lb 12.8 oz (86.546 kg)  10/07/12 187 lb (84.823 kg)  10/05/12 190 lb (86.183 kg)     Past Medical History  Diagnosis Date  . Hypothyroidism   . Edema   . Hyperlipidemia   . Pneumonia   . Vasovagal syncope     Current Outpatient Prescriptions  Medication Sig Dispense Refill  . albuterol (PROVENTIL HFA;VENTOLIN HFA) 108 (90 BASE) MCG/ACT inhaler Inhale 2 puffs into the lungs every 6 (six) hours as needed for wheezing.      . calcium carbonate (OS-CAL - DOSED IN MG OF ELEMENTAL CALCIUM) 1250 MG tablet Take 1 tablet by mouth every morning.       . chlorthalidone (HYGROTON) 25 MG tablet Take 25 mg by mouth every evening.       . fish oil-omega-3 fatty acids 1000 MG capsule Take 2 g by mouth every morning.       Marland Kitchen HYDROcodone-acetaminophen (NORCO/VICODIN) 5-325 MG per tablet Take 1 tablet by mouth every 6 (six) hours as needed for pain.  10 tablet  0  . ibuprofen (ADVIL,MOTRIN) 400 MG tablet Take 400 mg by mouth every 6 (six) hours as needed for pain.      Marland Kitchen levothyroxine (SYNTHROID, LEVOTHROID) 50 MCG tablet Take 50 mcg by mouth daily before breakfast.       . mometasone-formoterol (DULERA) 200-5 MCG/ACT AERO Inhale 1  puff into the lungs 2 (two) times daily.      . Multiple Vitamin (MULTIVITAMIN WITH MINERALS) TABS Take 1 tablet by mouth every morning.       . traMADol (ULTRAM) 50 MG tablet Take 1 tablet (50 mg total) by mouth every 6 (six) hours as needed for pain.  15 tablet  0  . vitamin B-12 (CYANOCOBALAMIN) 1000 MCG tablet Take 1,000 mcg by mouth every morning.       . vitamin C (ASCORBIC ACID) 500 MG tablet Take 2,000 mg by mouth every morning.        No current facility-administered medications for this visit.    Allergies:    Allergies  Allergen Reactions  . Codeine Nausea And Vomiting  . Hepatitis B Virus Vaccine Itching  . Pneumococcal Vaccines Itching    Social History:  The patient  reports  that she has never smoked. She does not have any smokeless tobacco history on file. She reports that she does not drink alcohol or use illicit drugs.  She denies exposure to asbestos.  Has never worked in [employer].    Family History:  The patient's family history includes CAD in an unspecified family member; Cancer in an unspecified family member; and Diabetes in an unspecified family member.  ROS:  Please see the history of present illness.   She denies fevers. She has a mild cough with production of scant amounts of green sputum. She denies hemoptysis. She's had some diarrhea. She denies melena or hematochezia. She denies hematuria. She denies significant weight changes. She does have some belching. She denies dysphagia or dysphagia.   All other systems reviewed and negative.   PHYSICAL EXAM: VS:  BP 108/68  Pulse 97  Ht 5\' 2"  (1.575 m)  Wt 190 lb 12.8 oz (86.546 kg)  BMI 34.89 kg/m2  SpO2 99%  LMP 10/07/2012 Well nourished, well developed, in no acute distress HEENT: normal Neck: no JVD Cardiac:  normal S1, S2; RRR; no murmur Chest: Mild tenderness to palpation of the sternum Lungs:  clear to auscultation bilaterally, no wheezing, rhonchi or rales Abd: soft, + epigastric tenderness, no hepatomegaly Ext: trace nonpitting bilateral edema Skin: warm and dry Neuro:  CNs 2-12 intact, no focal abnormalities noted  EKG:  From this morning at the emergency room: NSR, HR 80, normal axis, no acute changes     ASSESSMENT AND PLAN:  1. Chest Pain:  Her symptoms are quite atypical for ischemia. She has had continuous symptoms for about 3 weeks. She has had extensive testing and multiple ER visits without significantly abnormal results. I had a long discussion today with the patient and her father. Her father did ask me if it was time to get her referred to the [facility] for extensive evaluation. I do not think that this is necessary at this time. She had a negative heart  catheterization in 2006. We discussed the likelihood of her developing significantly obstructive coronary artery disease in that time frame is extremely low. She does have a family history for CAD and she does have some exertional component to her symptoms. She does not feel that she can walk on a treadmill. I will arrange a Lexiscan Myoview.  If this is negative, she needs no further cardiac workup.  I suspect her symptoms are mostly related to musculoskeletal chest pain. I have asked her to take ibuprofen 400 mg every 6-8 hours for one to 2 weeks. She does have some epigastric discomfort. I  have placed her on Protonix 40 mg daily. If her workup is negative, she may benefit from seeing GI.  She had scattered ground glass attenuation on recent chest CT. However, she does not have clinical findings to support CHF. She does not carry a dx of pulmonary disease.  She has an appointment to see Dr. Sherene Sires with pulmonology tomorrow. I have encouraged her to keep that appointment. I do note that she is already on [facility] and albuterol.  Her right heart was normal on her echo.  In the end, she may benefit from psychiatric evaluation.  I did not discuss this with her today and she denied a hx of anxiety.  But, with multiple normal tests, anxiety would have to be in the differential.   2. Edema:  Likely from venous insufficiency.  I have encouraged her to continue wearing compression stockings. 3. Disposition:  Follow up with Dr. Peter Swaziland in 2 weeks as planned.   Signed, Tereso Newcomer, PA-C  3:45 PM 10/26/2012

## 2012-10-26 NOTE — ED Notes (Signed)
Pt here via ems for c/o chest pain.Seen this Monday for same. given nitro and asa in route pain now 4/1

## 2012-10-26 NOTE — ED Notes (Signed)
[id number]<JY> Expected date:<BR> Expected time:<BR> Means of arrival:<BR> Comments:<BR> Chest pain; Hx same

## 2012-10-27 ENCOUNTER — Encounter: Payer: Self-pay | Admitting: Internal Medicine

## 2012-10-27 ENCOUNTER — Ambulatory Visit (INDEPENDENT_AMBULATORY_CARE_PROVIDER_SITE_OTHER): Payer: BC Managed Care – PPO | Admitting: Internal Medicine

## 2012-10-27 VITALS — BP 102/62 | HR 104 | Temp 97.4°F | Ht 62.0 in | Wt 193.2 lb

## 2012-10-27 DIAGNOSIS — R0602 Shortness of breath: Secondary | ICD-10-CM

## 2012-10-27 MED ORDER — FAMOTIDINE 20 MG PO TABS: ORAL_TABLET | ORAL | Status: DC

## 2012-10-27 NOTE — Progress Notes (Addendum)
  Subjective:    Patient ID: Destiny Sharp, female    DOB: November 05, 1970   MRN: [id number]  HPI  76 yowf never smoker never really had avg ex tolerance as child then pna 5th grade didn't effect the longterm fxn but since 2002 when had ruptured appendix really lost ground and then again since Jan 2013 noted shortness on walking across  parking lot where she usually walks daily to get on her bus and since then 5 trips to ER s dx so self referred 10/27/2012 to Pulmonary clinic   10/27/2012 f/u ov/Zosia Lucchese cc persistent proportionate sob x across the room assocleg swelling worse than usual and cp which is continuous midline worse with exertion. In addition lose breath sitting still and also lying down, some better with neb.  To complete her cardiac w/u  2/24 but so far all cardiac studies including cath are neg  No obvious daytime variabilty or assoc chronic cough or cp or chest tightness, subjective wheeze overt sinus or hb symptoms. No unusual exp hx or h/o childhood pna/ asthma or premature birth to his knowledge.   Sleeping ok without nocturnal  or early am exacerbation  of respiratory  c/o's or need for noct saba. Also denies any obvious fluctuation of symptoms with weather or environmental changes or other aggravating or alleviating factors except as outlined above    Review of Systems  Constitutional: Positive for activity change and appetite change. Negative for fever and unexpected weight change.  HENT: Positive for congestion. Negative for ear pain, nosebleeds, sore throat, rhinorrhea, sneezing, trouble swallowing, dental problem, postnasal drip and sinus pressure.   Eyes: Negative for redness and itching.  Respiratory: Positive for cough, chest tightness, shortness of breath and wheezing.   Cardiovascular: Positive for chest pain and leg swelling. Negative for palpitations.  Gastrointestinal: Negative for nausea and vomiting.  Genitourinary: Negative for dysuria.  Musculoskeletal: Negative  for joint swelling.  Skin: Positive for rash.  Neurological: Positive for dizziness and light-headedness. Negative for headaches.  Hematological: Does not bruise/bleed easily.  Psychiatric/Behavioral: Negative for dysphoric mood. The patient is not nervous/anxious.        Objective:   Physical Exam  Obese wf tachypnea at rest variable hoarseness and pseudowheeze and sigh breathing  Wt Readings from Last 3 Encounters:  10/27/12 193 lb 3.2 oz (87.635 kg)  10/26/12 190 lb 12.8 oz (86.546 kg)  10/07/12 187 lb (84.823 kg)     HEENT: nl dentition, turbinates, and orophanx. Nl external ear canals without cough reflex   NECK :  without JVD/Nodes/TM/ nl carotid upstrokes bilaterally   LUNGS: no acc muscle use, clear to A and P bilaterally without cough on insp or exp maneuvers   CV:  RRR  no s3 or murmur or increase in P2, no edema   ABD:  soft and nontender with nl excursion in the supine position. No bruits or organomegaly, bowel sounds nl  MS:  warm without deformities, calf tenderness, cyanosis or clubbing  SKIN: warm and dry without lesions    NEURO:  alert, approp, no deficits    CT chest 10/12/12 Negative for significant acute pulmonary embolus by CTA.  Low volume exam with scattered diffuse bilateral patchy ground-  glass attenuation which could represent early edema/volume overload  or mild alveolitis.  abgs 10/12/12  >>  7.61/20/122     Assessment & Plan:

## 2012-10-27 NOTE — Patient Instructions (Addendum)
Protonix (pantoprazole) 40 mg Take 30-60 min before first meal of the day and Pepcid 20 mg one bedtime.  GERD (REFLUX)  is an extremely common cause of respiratory symptoms(just like yours), many times with no significant heartburn at all.    It can be treated with medication, but also with lifestyle changes including weight avoidance of late meals, excessive alcohol, smoking cessation, and avoid fatty foods, chocolate, peppermint, colas, red wine, and acidic juices such as orange juice.  NO MINT OR MENTHOL PRODUCTS SO NO COUGH DROPS  USE SUGARLESS CANDY INSTEAD (jolley ranchers or Stover's)  NO OIL BASED VITAMINS - use powdered substitutes.     Pulmonary follow up is as needed.   If still frustrated in one month the step is cpst call [facility] [phone] to schedule pft's  Before and after

## 2012-10-28 NOTE — ED Provider Notes (Signed)
History/physical exam/procedure(s) were performed by non-physician practitioner and as supervising physician I was immediately available for consultation/collaboration. I have reviewed all notes and am in agreement with care and plan.   Hilario Quarry, MD 10/28/12 [phone]

## 2012-10-29 NOTE — ED Provider Notes (Signed)
Medical screening examination/treatment/procedure(s) were conducted as a shared visit with non-physician practitioner(s) and myself.  I personally evaluated the patient during the encounter.  NAD. ekg normal.  VS normal  Donnetta Hutching, MD 10/29/12 [phone]

## 2012-10-30 DIAGNOSIS — R9389 Abnormal findings on diagnostic imaging of other specified body structures: Secondary | ICD-10-CM | POA: Insufficient documentation

## 2012-10-30 NOTE — Assessment & Plan Note (Signed)
CT chest 10/12/12 Negative for significant acute pulmonary embolus by CTA.  Low volume exam with scattered diffuse bilateral patchy ground-  glass attenuation which could represent early edema/volume overload  or mild alveolitis.   Strongly doubt this is a signifcant pulmonary parenchymal problem based on perfeclty nl Aa 02 greadient and walking sats at ov and would not reapeat the ct scan unless clinical changes occur because it's liable to create more confusion and further potential unnecessary testing  in the delivery of her care

## 2012-10-30 NOTE — Assessment & Plan Note (Addendum)
-  abgs 10/12/12  >>  7.61/20/122 - 10/27/2012   Walked RA x one lap @ 185 stopped due to  Chest tight, no sob, no desat  - Spirometry with non-physiologic f/v loop 10/27/12 but no obst  Symptoms are markedly disproportionate to objective findings - she's clearly hyperventilating with a nl Aa 02 gradiant so  not clear this is even  a lung problem but pt does appear to have difficult airway management issues.   DDX of  difficult airways managment all start with A and  include Adherence, Ace Inhibitors, Acid Reflux, Active Sinus Disease, Alpha 1 Antitripsin deficiency, Anxiety masquerading as Airways dz,  ABPA,  allergy(esp in young), Aspiration (esp in elderly), Adverse effects of DPI,  Active smokers, plus two Bs  = Bronchiectasis and Beta blocker use..and one C= CHF  ? Acid reflux might be a unifying dx here with assoc cp > max rx  ? All anxiety > usually a dx of exclusion but I would place it closer to the top of this list in this case  ?  chf/ diastolic with atypical angina > cardiac w/u near completion and neg to date> if negative when completed then cpst while on max gerd rx is next step

## 2012-10-31 ENCOUNTER — Ambulatory Visit (HOSPITAL_COMMUNITY): Payer: BC Managed Care – PPO | Attending: Cardiovascular Disease | Admitting: Radiology

## 2012-10-31 VITALS — BP 103/71 | Ht 62.0 in | Wt 189.0 lb

## 2012-10-31 DIAGNOSIS — R079 Chest pain, unspecified: Secondary | ICD-10-CM

## 2012-10-31 DIAGNOSIS — R0989 Other specified symptoms and signs involving the circulatory and respiratory systems: Secondary | ICD-10-CM | POA: Insufficient documentation

## 2012-10-31 DIAGNOSIS — R0789 Other chest pain: Secondary | ICD-10-CM | POA: Insufficient documentation

## 2012-10-31 DIAGNOSIS — I651 Occlusion and stenosis of basilar artery: Secondary | ICD-10-CM | POA: Insufficient documentation

## 2012-10-31 DIAGNOSIS — R0602 Shortness of breath: Secondary | ICD-10-CM | POA: Insufficient documentation

## 2012-10-31 DIAGNOSIS — R0609 Other forms of dyspnea: Secondary | ICD-10-CM | POA: Insufficient documentation

## 2012-10-31 DIAGNOSIS — E785 Hyperlipidemia, unspecified: Secondary | ICD-10-CM | POA: Insufficient documentation

## 2012-10-31 MED ORDER — TECHNETIUM TC 99M SESTAMIBI GENERIC - CARDIOLITE
10.0000 | Freq: Once | INTRAVENOUS | Status: AC | PRN
  Administered 2012-10-31: 10 via INTRAVENOUS

## 2012-10-31 MED ORDER — REGADENOSON 0.4 MG/5ML IV SOLN
0.4000 mg | Freq: Once | INTRAVENOUS | Status: AC
  Administered 2012-10-31: 0.4 mg via INTRAVENOUS

## 2012-10-31 MED ORDER — AMINOPHYLLINE 25 MG/ML IV SOLN
150.0000 mg | Freq: Once | INTRAVENOUS | Status: AC
  Administered 2012-10-31: 150 mg via INTRAVENOUS

## 2012-10-31 MED ORDER — TECHNETIUM TC 99M SESTAMIBI GENERIC - CARDIOLITE
30.0000 | Freq: Once | INTRAVENOUS | Status: AC | PRN
  Administered 2012-10-31: 30 via INTRAVENOUS

## 2012-10-31 NOTE — Progress Notes (Signed)
[facility] SITE 3 NUCLEAR MED [address] [place], [place] [address] [phone]    Cardiology Nuclear Med Study  Destiny Sharp is a 42 y.o. female     MRN : [id number]     DOB: 07/02/1971  Procedure Date: 10/31/2012  Nuclear Med Background Indication for Stress Test:  Evaluation for Ischemia, and Patient seen in hospital on 10-26-12 for Chest Pain, Enzymes negative History:  '06 Heart Cath: EF: 70% NL, 2/14 ECHO: EF: 55-60% Several ER visits for CP (-) enzymes  Cardiac Risk Factors: Carotid Disease, Family History - CAD and Lipids  Symptoms:  Chest Pain, DOE, Fatigue, Fatigue with Exertion and SOB   Nuclear Pre-Procedure Caffeine/Decaff Intake:  None > 12 hrs NPO After: 7:30pm   Lungs:  clear O2 Sat: 97% on room air. IV 0.9% NS with Angio Cath:  24g  IV Site: R Forearm X 1, tolerated well IV Started by:  Irean Hong, RN  Chest Size (in):  46 Cup Size: C  Height: 5\' 2"  (1.575 m)  Weight:  189 lb (85.73 kg)  BMI:  Body mass index is 34.56 kg/(m^2). Tech Comments:  No medications today. This patient was very symptomatic and had to be reversed 2x with Aminophylline 150 mg, in order to completely resolve all the chest tightness and sob.    Nuclear Med Study 1 or 2 day study: 1 day  Stress Test Type:  Lexiscan  Reading MD: Charlton Haws, MD  Order Authorizing Provider:  Mariajose Mow [place], MD, and Tereso Newcomer, [facility]  Resting Radionuclide: Technetium 1m Sestamibi  Resting Radionuclide Dose: 11.0 mCi   Stress Radionuclide:  Technetium 31m Sestamibi  Stress Radionuclide Dose: 33.0 mCi           Stress Protocol Rest HR: 82 Stress HR: 121  Rest BP: 103/71 Stress BP: 109/64  Exercise Time (min): n/a METS: n/a   Predicted Max HR: 179 bpm % Max HR: 67.6 bpm Rate Pressure Product: 21308   Dose of Adenosine (mg):  n/a Dose of Lexiscan: 0.4 mg  Dose of Atropine (mg): n/a Dose of Dobutamine: n/a mcg/kg/min (at max HR)  Stress Test Technologist: Milana Na, EMT-P   Nuclear Technologist:  Domenic Polite, CNMT     Rest Procedure:  Myocardial perfusion imaging was performed at rest 45 minutes following the intravenous administration of Technetium 57m Sestamibi. Rest ECG: NSR - Normal EKG  Stress Procedure:  The patient received IV Lexiscan 0.4 mg over 15-seconds.  Technetium 66m Sestamibi injected at 30-seconds. This patient had sob, fatigue, nausea, and chest tightness with Lexiscan. Quantitative spect images were obtained after a 45 minute delay. Stress ECG: No significant change from baseline ECG  QPS Raw Data Images:  Normal; no motion artifact; normal heart/lung ratio. Stress Images:  Normal homogeneous uptake in all areas of the myocardium. Rest Images:  Normal homogeneous uptake in all areas of the myocardium. Subtraction (SDS):  Normal Transient Ischemic Dilatation (Normal <1.22):  1.06 Lung/Heart Ratio (Normal <0.45):  0.37  Quantitative Gated Spect Images QGS EDV:  52 ml QGS ESV:  10 ml  Impression Exercise Capacity:  Lexiscan with no exercise. BP Response:  Normal blood pressure response. Clinical Symptoms:  fatigue, dyspnea, nausea and chest tightness ECG Impression:  No significant ST segment change suggestive of ischemia. Comparison with Prior Nuclear Study: No images to compare  Overall Impression:  Normal stress nuclear study.  LV Ejection Fraction: 81%.  LV Wall Motion:  NL LV Function; NL Wall Motion  Charlton Haws

## 2012-11-01 ENCOUNTER — Emergency Department (HOSPITAL_COMMUNITY)
Admission: EM | Admit: 2012-11-01 | Discharge: 2012-11-02 | Disposition: A | Discharge status: Home / Self Care | Payer: BC Managed Care – PPO | Attending: Emergency Medicine | Admitting: Emergency Medicine

## 2012-11-01 ENCOUNTER — Encounter: Payer: Self-pay | Admitting: Physician Assistant

## 2012-11-01 DIAGNOSIS — M549 Dorsalgia, unspecified: Secondary | ICD-10-CM | POA: Insufficient documentation

## 2012-11-01 DIAGNOSIS — R079 Chest pain, unspecified: Secondary | ICD-10-CM | POA: Insufficient documentation

## 2012-11-01 DIAGNOSIS — Z8669 Personal history of other diseases of the nervous system and sense organs: Secondary | ICD-10-CM | POA: Insufficient documentation

## 2012-11-01 DIAGNOSIS — IMO0001 Reserved for inherently not codable concepts without codable children: Secondary | ICD-10-CM | POA: Insufficient documentation

## 2012-11-01 DIAGNOSIS — M542 Cervicalgia: Secondary | ICD-10-CM | POA: Insufficient documentation

## 2012-11-01 DIAGNOSIS — Z8739 Personal history of other diseases of the musculoskeletal system and connective tissue: Secondary | ICD-10-CM

## 2012-11-01 DIAGNOSIS — R0602 Shortness of breath: Secondary | ICD-10-CM | POA: Insufficient documentation

## 2012-11-01 DIAGNOSIS — E785 Hyperlipidemia, unspecified: Secondary | ICD-10-CM | POA: Insufficient documentation

## 2012-11-01 DIAGNOSIS — E039 Hypothyroidism, unspecified: Secondary | ICD-10-CM | POA: Insufficient documentation

## 2012-11-01 DIAGNOSIS — Z79899 Other long term (current) drug therapy: Secondary | ICD-10-CM | POA: Insufficient documentation

## 2012-11-01 DIAGNOSIS — Z8701 Personal history of pneumonia (recurrent): Secondary | ICD-10-CM | POA: Insufficient documentation

## 2012-11-02 ENCOUNTER — Encounter (HOSPITAL_COMMUNITY): Payer: Self-pay | Admitting: *Deleted

## 2012-11-02 ENCOUNTER — Telehealth: Payer: Self-pay | Admitting: *Deleted

## 2012-11-02 ENCOUNTER — Other Ambulatory Visit: Payer: Self-pay

## 2012-11-02 LAB — POCT I-STAT TROPONIN I: Troponin i, poc: 0 ng/mL (ref 0.00–0.08)

## 2012-11-02 MED ORDER — ONDANSETRON HCL 4 MG/2ML IJ SOLN
4.0000 mg | Freq: Once | INTRAMUSCULAR | Status: AC
Start: 2012-11-02 — End: 2012-11-02
  Administered 2012-11-02: 4 mg via INTRAVENOUS
  Filled 2012-11-02: qty 2

## 2012-11-02 MED ORDER — ONDANSETRON HCL 4 MG/2ML IJ SOLN
4.0000 mg | Freq: Once | INTRAMUSCULAR | Status: AC
  Administered 2012-11-02: 4 mg via INTRAVENOUS
  Filled 2012-11-02: qty 2

## 2012-11-02 MED ORDER — PROMETHAZINE HCL 25 MG/ML IJ SOLN
12.5000 mg | Freq: Once | INTRAMUSCULAR | Status: AC
  Administered 2012-11-02: 12.5 mg via INTRAVENOUS
  Filled 2012-11-02: qty 1

## 2012-11-02 MED ORDER — HYDROMORPHONE HCL PF 1 MG/ML IJ SOLN
1.0000 mg | Freq: Once | INTRAMUSCULAR | Status: AC
  Administered 2012-11-02: 1 mg via INTRAVENOUS
  Filled 2012-11-02: qty 1

## 2012-11-02 NOTE — Telephone Encounter (Signed)
Agree. Follow up with PCP. Tereso Newcomer, PA-C  1:20 PM 11/02/2012

## 2012-11-02 NOTE — ED Provider Notes (Signed)
History     CSN: [id number]  Arrival date & time 11/01/12  2353   First MD Initiated Contact with Patient 11/02/12 0031      Chief Complaint  Patient presents with  . Chest Pain    (Consider location/radiation/quality/duration/timing/severity/associated sxs/prior treatment) HPI Hx per PT, CP for the last 2 months, has fibromyalgia.  States this pain is present and constant for the last 2 weeks, sternal and has associated SOB and spots of back pain.  Pain is stabbing in qulaity. Mod in severity. PT had a stress test yesterday, followed by Cardiology and has appt next week.  She is very anxious about her stress test results.  No F/C. No trauma. No rash. No leg pain or swelling.  PT tells me she has had "everything done" but no one is able to tell her the cause of her pain.     Past Medical History  Diagnosis Date  . Hypothyroidism   . Edema   . Hyperlipidemia   . Pneumonia   . Vasovagal syncope   . Sleep apnea   . Fibromyalgia   . Scoliosis   . Hx of cardiovascular stress test     Lex MV 2/14: EF 81%, no ischemia    Past Surgical History  Procedure Laterality Date  . Appendectomy    . Nasal sinus surgery    . Mouth surgery      Family History  Problem Relation Age of Onset  . CAD Maternal Grandmother   . Cancer Mother     ovarian  . Emphysema Maternal Uncle   . Heart disease Maternal Grandfather   . Heart disease Paternal Grandfather   . Heart disease Paternal Grandmother     History  Substance Use Topics  . Smoking status: Never Smoker   . Smokeless tobacco: Never Used  . Alcohol Use: No    OB History   Grav Para Term Preterm Abortions TAB SAB Ect Mult Living                  Review of Systems  Constitutional: Negative for fever and chills.  HENT: Negative for neck pain and neck stiffness.   Eyes: Negative for pain.  Respiratory: Positive for shortness of breath.   Cardiovascular: Positive for chest pain. Negative for palpitations and leg swelling.   Gastrointestinal: Negative for abdominal pain.  Genitourinary: Negative for dysuria.  Musculoskeletal: Negative for back pain.  Skin: Negative for rash.  Neurological: Negative for headaches.  All other systems reviewed and are negative.    Allergies  Codeine; Hepatitis b virus vaccine; and Pneumococcal vaccines  Home Medications   Current Outpatient Rx  Name  Route  Sig  Dispense  Refill  . albuterol (PROVENTIL HFA;VENTOLIN HFA) 108 (90 BASE) MCG/ACT inhaler   Inhalation   Inhale 2 puffs into the lungs every 6 (six) hours as needed for wheezing.         . calcium carbonate (OS-CAL - DOSED IN MG OF ELEMENTAL CALCIUM) 1250 MG tablet   Oral   Take 1 tablet by mouth daily.         . chlorthalidone (HYGROTON) 25 MG tablet   Oral   Take 25 mg by mouth every evening.          . famotidine (PEPCID) 20 MG tablet      One at bedtime   30 tablet   2   . ibuprofen (ADVIL,MOTRIN) 400 MG tablet   Oral   Take 400 mg by  mouth every 6 (six) hours as needed for pain.         Marland Kitchen levothyroxine (SYNTHROID, LEVOTHROID) 50 MCG tablet   Oral   Take 50 mcg by mouth daily before breakfast.          . mometasone-formoterol (DULERA) 200-5 MCG/ACT AERO   Inhalation   Inhale 1 puff into the lungs 2 (two) times daily.         . Multiple Vitamin (MULTIVITAMIN WITH MINERALS) TABS   Oral   Take 1 tablet by mouth every morning.          Marland Kitchen omega-3 acid ethyl esters (LOVAZA) 1 G capsule   Oral   Take 2 g by mouth daily.         . pantoprazole (PROTONIX) 40 MG tablet   Oral   Take 1 tablet (40 mg total) by mouth daily.   30 tablet   3   . traMADol (ULTRAM) 50 MG tablet   Oral   Take 1 tablet (50 mg total) by mouth every 6 (six) hours as needed for pain.   15 tablet   0   . vitamin B-12 (CYANOCOBALAMIN) 1000 MCG tablet   Oral   Take 1,000 mcg by mouth every morning.          . vitamin C (ASCORBIC ACID) 500 MG tablet   Oral   Take 1,000 mg by mouth every morning.           Marland Kitchen HYDROcodone-acetaminophen (NORCO/VICODIN) 5-325 MG per tablet   Oral   Take 1 tablet by mouth every 6 (six) hours as needed for pain.   10 tablet   0     BP 134/75  Pulse 88  Temp(Src) 97.9 F (36.6 C) (Oral)  Resp 26  SpO2 100%  LMP 10/07/2012  Physical Exam  Constitutional: She is oriented to person, place, and time. She appears well-developed and well-nourished.  HENT:  Head: Normocephalic and atraumatic.  Mouth/Throat: Oropharynx is clear and moist. No oropharyngeal exudate.  Eyes: Conjunctivae and EOM are normal. Pupils are equal, round, and reactive to light.  Neck: Neck supple.  Cardiovascular: Normal rate, regular rhythm and intact distal pulses.   Pulmonary/Chest: Effort normal. No respiratory distress. She has no wheezes. She has no rales. She exhibits no tenderness.  Abdominal: Soft. Bowel sounds are normal. She exhibits no distension. There is no tenderness.  Musculoskeletal: Normal range of motion. She exhibits no edema.  Neurological: She is alert and oriented to person, place, and time.  Skin: Skin is warm and dry.    ED Course  Procedures (including critical care time)  Results for orders placed during the hospital encounter of 10/26/12  POCT I-STAT, CHEM 8      Result Value Range   Sodium 141  135 - 145 mEq/L   Potassium 3.9  3.5 - 5.1 mEq/L   Chloride 108  96 - 112 mEq/L   BUN 15  6 - 23 mg/dL   Creatinine, Ser 1.47  0.50 - 1.10 mg/dL   Glucose, Bld 829 (*) 70 - 99 mg/dL   Calcium, Ion 5.62  1.30 - 1.23 mmol/L   TCO2 23  0 - 100 mmol/L   Hemoglobin 11.6 (*) 12.0 - 15.0 g/dL   HCT 86.5 (*) 78.4 - 69.6 %  POCT I-STAT TROPONIN I      Result Value Range   Troponin i, poc 0.00  0.00 - 0.08 ng/mL   Comment 3  Dg Chest 2 View  10/26/2012  *RADIOLOGY REPORT*  Clinical Data: Pain, shortness of breath.  CHEST - 2 VIEW  Comparison: 10/19/2012  Findings: Heart and mediastinal contours are within normal limits. No focal opacities or  effusions.  No acute bony abnormality.  IMPRESSION: No active cardiopulmonary disease.   Original Report Authenticated By: Charlett Nose, M.D.    Dg Chest 2 View  10/19/2012  *RADIOLOGY REPORT*  Clinical Data: Chest pain  CHEST - 2 VIEW  Comparison: 10/12/2012  Findings: Low lung volumes are present, causing crowding of the pulmonary vasculature.  Cardiac and mediastinal contours appear normal.  The lungs appear clear.  No pleural effusion is identified.  IMPRESSION:  No significant abnormality identified.   Original Report Authenticated By: Gaylyn Rong, M.D.    Ct Angio Chest Pe W/cm &/or Wo Cm  10/12/2012  *RADIOLOGY REPORT*  Clinical Data: Chest pain, shortness of breath  CT ANGIOGRAPHY CHEST  Technique:  Multidetector CT imaging of the chest using the standard protocol during bolus administration of intravenous contrast. Multiplanar reconstructed images including MIPs were obtained and reviewed to evaluate the vascular anatomy.  Contrast: OMNIPAQUE IOHEXOL 350 MG/ML SOLN  Comparison: 10/05/2012 chest x-ray  Findings: Pulmonary arteries are well visualized contrast.  No filling defect or significant pulmonary embolus identified by CTA. Thoracic aorta appears intact.  Negative for aneurysm, dissection or mediastinal hematoma.  Normal heart size.  No pericardial or pleural effusion.  Negative for adenopathy.  Upper abdomen demonstrates no acute process.  Lung windows demonstrate scattered patchy nonspecific ground-glass attenuation throughout both lungs which could represent early mild edema versus volume overload or mild alveolitis.  Appearance is nonspecific.  Minimal basilar atelectasis.  No focal airspace consolidation, definite pneumonia, collapse, or central airway abnormality.  Trachea and central airways appear patent.  No osseous abnormality.  IMPRESSION: Negative for significant acute pulmonary embolus by CTA.  Low volume exam with scattered diffuse bilateral patchy ground- glass  attenuation which could represent early edema/volume overload or mild alveolitis.   Original Report Authenticated By: Judie Petit. Miles Costain, M.D.    US Venous Img Lower Bilateral   10/05/2012  *RADIOLOGY REPORT*  Clinical Data: Lower extremity edema, chronic fatigue and swelling concerning for venous insufficiency  BILATERAL LOWER EXTREMITY VENOUS DUPLEX ULTRASOUND  Technique:  Gray-scale sonography with graded compression, as well as color Doppler and duplex ultrasound, were performed to evaluate the deep and superficial veins of both lower extremities.  Spectral Doppler was utilized to evaluate flow at rest and with distal augmentation maneuvers.  A complete superficial venous insufficiency exam was performed in the upright standing position. I personally performed the technical portion of the exam.  Comparison:  None.  Findings:  Right Lower Extremity:  The right saphenofemoral junction is patent and normal.  No evidence of venous insufficiency.  Right G S V is normal in caliber and remains patent without reflux or insufficiency.  Right small saphenous vein is also normal in caliber without reflux.  Right lower extremity deep veins are patent and compressible with augmentation and normal phasicity. Negative for DVT.  Left Lower Extremity:  The left saphenofemoral junction, great saphenous vein, and small saphenous vein are also all normal without venous insufficiency or reflux.  No sub surface varicosities.  The left femoral and popliteal deep veins demonstrate normal phasicity, augmentation and compressibility without DVT.  IMPRESSION: Normal G S V and S S V anatomy bilaterally.  Negative for superficial saphenous reflux or sub surface varicosities in either extremity.  Negative  for DVT.   Original Report Authenticated By: Judie Petit. Miles Costain, M.D.    Dg Chest Port 1 View  10/05/2012  *RADIOLOGY REPORT*  Clinical Data: Chest pain.  PORTABLE CHEST - 1 VIEW  Comparison: 05/14/2009 chest radiograph  Findings: The cardiomediastinal  silhouette is unremarkable. There is no evidence of focal airspace disease, pulmonary edema, suspicious pulmonary nodule/mass, pleural effusion, or pneumothorax. No acute bony abnormalities are identified.  IMPRESSION: No evidence of active cardiopulmonary disease.   Original Report Authenticated By: Harmon Pier, M.D.    Ir Radiologist Eval & Mgmt  10/05/2012  *RADIOLOGY REPORT*  NEW PATIENT OFFICE VISIT - LEVEL III [phone])  Chief Complaint:  Chronic bilateral lower extremity edema, fatigue, leg pain, dermatitis.  Evaluate for venous insufficiency.  History:  42 year old female with chronic bilateral lower extremity swelling, fatigue and pain.  She has chronic lower calf and ankle mild dermatitis changes with eczema and redness.  No history of active bleeding or ulceration.  She has had an extensive workup with previous venous Doppler studies demonstrate no evidence of DVT.  She says she has had arterial exams demonstrate no evidence of peripheral vascular disease.  She has been evaluated by vascular surgery and cardiology.  She now presents for evaluation for venous insufficiency.  She reports chronic leg pain, fatigue and heaviness.  This worsens throughout the day.  She has a fairly sedentary life style and works as a Surveyor, mining.  She has been intermittently using compression stockings with no significant improvement.  Past Medical History:  Migraine headaches, vasovagal syncope, fibromyalgia, thyroid disease  Medications:  Levo thyroxine 50 mcg daily, Caltrate D 600 daily, Fish oil 1200 4 po qd, oscal 1 po qd, vit B12 and C daily  Allergies:  Codeine, Hep B vaccine, pneumococcal vaccine, immtrex  Social History:  Negative for alcohol or drug use.  Current school bus driver.  No tobacco use.  Daily caffeine intake.  Family History:  Positive for ovarian cancer in her mother. Maternal aunt with breast cancer.  Review of Systems:  No shortness of breath, nausea, diaphoresis, dizziness.  Mild epigastric  discomfort, chronic.  Chronic lower extremity edema and symptoms as above.  Exam:  Afebrile, vital signs stable. General:  No acute distress.  Mildly obese. Affect: appropriate.  Lower extremities:  Mild symmetric +2 pedal edema.  Lower calf and ankle mild redness, eczema, and skin thickening noted.  Pattern is consistent with dermatitis. Pulses:  Normal symmetric dorsalis pedis pulses.  Normal capillary refill.  Today, I spent approximate 30 minutes with the patient and reviewed the patients physical exam and ultrasound findings.  Ultrasound was dictated separately but confirms normal saphenous anatomy bilaterally.  No significant venous insufficiency or sub surface varicosities.  Negative for DVT.  Therefore, she does not need any transcatheter laser venous treatments, sclerotherapy, or vein removal.  Her lower extremity venous anatomy is normal.  She appears to have chronic lower extremity edema/dermatitis.  I suspect this is multifactorial but predominantly related to her obesity and sedentary life style.  I would recommend a conservative treatment plan with daily knee high prescription strength compression stockings (20/30 mmHg).  Daily walking and initiating a weight management program.  These findings were discussed at length with the patient.  She has a clear understanding.  Certainly if any visible varicosities develop she could be reevaluated as-needed.  Assessment and Plan:  Nonspecific symmetric lower extremity edema with chronic calf and ankle mild dermatitis, suspect related to obesity and sedentary life style.  Plan:  20/30 mmHg compression stockings. Daily exercise Initiate weight management program   Original Report Authenticated By: Judie Petit. Miles Costain, M.D.      Date: 11/02/2012  Rate: 89  Rhythm: normal sinus rhythm  QRS Axis: normal  Intervals: normal  ST/T Wave abnormalities: nonspecific ST changes  Conduction Disutrbances:none  Narrative Interpretation:   Old EKG Reviewed: unchanged  IV  dilaudid. IV Zofran.  2:06 AM pain improved. I had a long discussion with patient. I think all of her symptoms are related to her fibromyalgia. She agrees to call her primary care physician in the morning to request a referral to a chronic pain specialist.  No indication for admission at this time.  MDM  Chronic chest pain x2 months  Negative stress test yesterday  Old records reviewed, and has had extensive and multiple workup for her symptoms including PE studies, stress testing, ultrasounds, EKGs, chest x-rays, cardiac enzymes.  Has negative troponin tonight with an EKG that is unchanged.    Vital signs nursing notes reviewed - appropriate for outpatient management       Sunnie Nielsen, MD 11/02/12 0210

## 2012-11-02 NOTE — Telephone Encounter (Signed)
PC to patient to notify that stress test normal per request by Tereso Newcomer, PA. Patient stated she was in ED last night with chest pain and this is the sixth time within the last month she has been to ED for same problem. Stated the ED said it could be related to her fibromyalgia. They provided her with Dilaudid, Zofran and Phenergan, released her and instructed her to rest and follow up with PCP. She is worried about her time away from work. Has upcoming appt. With Dr. [place] on 3/6.  Advised her to followup with PCP as instructed in ED last night and her information will be routed to Dr. [place] and Tereso Newcomer, PA. Advised her that she can inquire to HR at her employer and her PCP to discuss possible FMLA if MD feels this is a qualifying illness.

## 2012-11-02 NOTE — Telephone Encounter (Signed)
Spoke with patient was told Destiny Newcomer PA advised to follow up with PCP.Advised to keep appointment with Dr.Jordan 11/10/12.

## 2012-11-04 ENCOUNTER — Telehealth: Payer: Self-pay | Admitting: Cardiology

## 2012-11-04 NOTE — Telephone Encounter (Signed)
STAT-New Prob    Pt experiencing chest pain (on and off for 2 months now) & SOB. Would like to be seen today.

## 2012-11-04 NOTE — Telephone Encounter (Signed)
Called patient back. She states that she has had chest pain on and off for 1 to 2 months and wanted to move up her appointment with Dr.Jordan. Has not been taking Protonix for the past 6 nights and only takes Motrin at night because she feels like it makes her sleepy. Advised her to go back on Protonix and Motrin as ordered by physician and keep appointment with Dr.Jordan next week.

## 2012-11-10 ENCOUNTER — Encounter: Payer: Self-pay | Admitting: Cardiology

## 2012-11-10 ENCOUNTER — Ambulatory Visit (INDEPENDENT_AMBULATORY_CARE_PROVIDER_SITE_OTHER): Payer: BC Managed Care – PPO | Admitting: Cardiology

## 2012-11-10 VITALS — BP 104/78 | HR 85 | Ht 62.0 in | Wt 191.4 lb

## 2012-11-10 DIAGNOSIS — R0602 Shortness of breath: Secondary | ICD-10-CM

## 2012-11-10 DIAGNOSIS — R5383 Other fatigue: Secondary | ICD-10-CM

## 2012-11-10 NOTE — Patient Instructions (Signed)
We will schedule you for a right heart cath.

## 2012-11-11 NOTE — Progress Notes (Signed)
Destiny Sharp Date of Birth: 02-Aug-1971 Medical Record #[id number]  History of Present Illness: Destiny Sharp is seen for follow up today. She is a 42 year old white female who is seen for evaluation of increasing dyspnea. She has a history of vasovagal syncope and chest pain. She had a cardiac catheterization in 2006 which showed normal coronary anatomy and normal LV function. EDP at that time was 19 mmHg. She has had multiple emergency department evaluations for chest pain and dyspnea. Her primary complaint today is that since early January she has noted a marked increase in shortness of breath. He states she cannot walk 30 feet now without getting out of breath. Sometimes it may take several hours for her to be able to catch her breath after a period of exertion. Previously she was able to walk a mile at the local [employer] track. He used to take about 15 minutes to walk lap. She can no longer do so.  Recent evaluation includes a normal chest x-ray. CT of the chest was negative for pulmonary emboli. There was some diffuse patchy groundglass attenuation consistent with early edema versus alveolitis. ECG tracings have been normal. Echocardiogram performed in February showed normal LV function and wall thickness. There is pseudo-normal filling. Valve function is normal. There was no pericardial effusion.the a Myoview study was normal showing no perfusion abnormality and ejection fraction of 81%. She was seen by pulmonary. Spirometry showed nonobstructive disease. Arterial blood gas showed respiratory alkalosis. She was treated for acid reflux disease without any improvement.  Current Outpatient Prescriptions on File Prior to Visit  Medication Sig Dispense Refill  . albuterol (PROVENTIL HFA;VENTOLIN HFA) 108 (90 BASE) MCG/ACT inhaler Inhale 2 puffs into the lungs every 6 (six) hours as needed for wheezing.      . calcium carbonate (OS-CAL - DOSED IN MG OF ELEMENTAL CALCIUM) 1250 MG tablet Take  1 tablet by mouth daily.      . chlorthalidone (HYGROTON) 25 MG tablet Take 25 mg by mouth every evening.       . famotidine (PEPCID) 20 MG tablet One at bedtime  30 tablet  2  . HYDROcodone-acetaminophen (NORCO/VICODIN) 5-325 MG per tablet Take 1 tablet by mouth every 6 (six) hours as needed for pain.  10 tablet  0  . ibuprofen (ADVIL,MOTRIN) 400 MG tablet Take 400 mg by mouth every 6 (six) hours as needed for pain.      Marland Kitchen levothyroxine (SYNTHROID, LEVOTHROID) 50 MCG tablet Take 50 mcg by mouth daily before breakfast.       . mometasone-formoterol (DULERA) 200-5 MCG/ACT AERO Inhale 1 puff into the lungs 2 (two) times daily.      . Multiple Vitamin (MULTIVITAMIN WITH MINERALS) TABS Take 1 tablet by mouth every morning.       Marland Kitchen omega-3 acid ethyl esters (LOVAZA) 1 G capsule Take 2 g by mouth daily.      . pantoprazole (PROTONIX) 40 MG tablet Take 1 tablet (40 mg total) by mouth daily.  30 tablet  3  . traMADol (ULTRAM) 50 MG tablet Take 1 tablet (50 mg total) by mouth every 6 (six) hours as needed for pain.  15 tablet  0  . vitamin B-12 (CYANOCOBALAMIN) 1000 MCG tablet Take 1,000 mcg by mouth every morning.       . vitamin C (ASCORBIC ACID) 500 MG tablet Take 1,000 mg by mouth every morning.        No current facility-administered medications on file prior to visit.  Allergies  Allergen Reactions  . Codeine Nausea And Vomiting  . Hepatitis B Virus Vaccine Itching  . Pneumococcal Vaccines Itching    Past Medical History  Diagnosis Date  . Hypothyroidism   . Edema   . Hyperlipidemia   . Pneumonia   . Vasovagal syncope   . Sleep apnea   . Fibromyalgia   . Scoliosis   . Hx of cardiovascular stress test     Lex MV 2/14: EF 81%, no ischemia    Past Surgical History  Procedure Laterality Date  . Appendectomy    . Nasal sinus surgery    . Mouth surgery      History  Smoking status  . Never Smoker   Smokeless tobacco  . Never Used    History  Alcohol Use No    Family  History  Problem Relation Age of Onset  . CAD Maternal Grandmother   . Cancer Mother     ovarian  . Emphysema Maternal Uncle   . Heart disease Maternal Grandfather   . Heart disease Paternal Grandfather   . Heart disease Paternal Grandmother     Review of Systems: As noted in history of present illness.  All other systems were reviewed and are negative.  Physical Exam: BP 104/78  Pulse 85  Ht 5\' 2"  (1.575 m)  Wt 191 lb 6.4 oz (86.818 kg)  BMI 35 kg/m2  SpO2 99%  LMP 10/07/2012 She is an obese white female who appears dyspneic at rest. She is hyperventilating. Her HEENT exam is unremarkable. Neck is without JVD or bruits. Lungs are clear. Cardiac exam reveals a regular rate and rhythm without gallop, murmur, or click. Abdomen is soft and nontender. No masses or bruits. Extremities reveal trace edema. Skin is warm and dry. Pedal pulses are palpable. Neurologic exam is nonfocal. Cranial nerves II through XII are intact. LABORATORY DATA: Cardiology Nuclear Med Study  Destiny Sharp is a 42 y.o. female MRN : [id number] DOB: 1971-02-16  Procedure Date: 10/31/2012  Nuclear Med Background  Indication for Stress Test: Evaluation for Ischemia, and Patient seen in hospital on 10-26-12 for Chest Pain, Enzymes negative  History: '06 Heart Cath: EF: 70% NL, 2/14 ECHO: EF: 55-60% Several ER visits for CP (-) enzymes  Cardiac Risk Factors: Carotid Disease, Family History - CAD and Lipids  Symptoms: Chest Pain, DOE, Fatigue, Fatigue with Exertion and SOB  Nuclear Pre-Procedure  Caffeine/Decaff Intake: None > 12 hrs  NPO After: 7:30pm   Lungs: clear  O2 Sat: 97% on room air.  IV 0.9% NS with Angio Cath: 24g   IV Site: R Forearm X 1, tolerated well  IV Started by: Irean Hong, RN   Chest Size (in): 46  Cup Size: C   Height: 5\' 2"  (1.575 m)  Weight: 189 lb (85.73 kg)   BMI: Body mass index is 34.56 kg/(m^2).  Tech Comments: No medications today. This patient was very symptomatic and had to be  reversed 2x with Aminophylline 150 mg, in order to completely resolve all the chest tightness and sob.   Nuclear Med Study  1 or 2 day study: 1 day  Stress Test Type: Eugenie Birks   Reading MD: Charlton Haws, MD  Order Authorizing Provider: Peter [place], MD, and Tereso Newcomer, [facility]   Resting Radionuclide: Technetium 31m Sestamibi  Resting Radionuclide Dose: 11.0 mCi   Stress Radionuclide: Technetium 2m Sestamibi  Stress Radionuclide Dose: 33.0 mCi   Stress Protocol  Rest HR: 82  Stress HR: 121  Rest BP: 103/71  Stress BP: 109/64   Exercise Time (min): n/a  METS: n/a   Predicted Max HR: 179 bpm  % Max HR: 67.6 bpm  Rate Pressure Product: 33295  Dose of Adenosine (mg): n/a  Dose of Lexiscan: 0.4 mg   Dose of Atropine (mg): n/a  Dose of Dobutamine: n/a mcg/kg/min (at max HR)   Stress Test Technologist: Milana Na, EMT-P  Nuclear Technologist: Domenic Polite, CNMT   Rest Procedure: Myocardial perfusion imaging was performed at rest 45 minutes following the intravenous administration of Technetium 48m Sestamibi.  Rest ECG: NSR - Normal EKG  Stress Procedure: The patient received IV Lexiscan 0.4 mg over 15-seconds. Technetium 51m Sestamibi injected at 30-seconds. This patient had sob, fatigue, nausea, and chest tightness with Lexiscan. Quantitative spect images were obtained after a 45 minute delay.  Stress ECG: No significant change from baseline ECG  QPS  Raw Data Images: Normal; no motion artifact; normal heart/lung ratio.  Stress Images: Normal homogeneous uptake in all areas of the myocardium.  Rest Images: Normal homogeneous uptake in all areas of the myocardium.  Subtraction (SDS): Normal  Transient Ischemic Dilatation (Normal <1.22): 1.06  Lung/Heart Ratio (Normal <0.45): 0.37  Quantitative Gated Spect Images  QGS EDV: 52 ml  QGS ESV: 10 ml  Impression  Exercise Capacity: Lexiscan with no exercise.  BP Response: Normal blood pressure response.  Clinical Symptoms: fatigue,  dyspnea, nausea and chest tightness  ECG Impression: No significant ST segment change suggestive of ischemia.  Comparison with Prior Nuclear Study: No images to compare  Overall Impression: Normal stress nuclear study.  LV Ejection Fraction: 81%. LV Wall Motion: NL LV Function; NL Wall Motion  Charlton Haws   Transthoracic Echocardiography  Patient: Destiny Sharp, Destiny Sharp MR #: [id number] Study Date: 10/17/2012 Gender: F Age: 42 Height: 157.5cm Weight: 84.8kg BSA: 1.35m^2 Pt. Status: Room:  ATTENDING Odelia Gage, Sharol Harness PERFORMING [facility], [facility] SONOGRAPHER Junious Dresser, RDCS cc:  ------------------------------------------------------------ LV EF: 55% - 60%  ------------------------------------------------------------ Indications: Chest pain 786.51. Dyspnea 786.09.  ------------------------------------------------------------ History: PMH: Acquired from the patient and from the patient's chart. Chest pain, associated with Shortness of breath. Fatigue, dyspnea, and bilateral lower extremity edema. No prior cardiac history. Risk factors: Hypertension. Obese.  ------------------------------------------------------------ Study Conclusions  - Left ventricle: The cavity size was normal. Wall thickness was normal. Systolic function was normal. The estimated ejection fraction was in the range of 55% to 60%. Regional wall motion abnormalities cannot be excluded. Features are consistent with a pseudonormal left ventricular filling pattern, with concomitant abnormal relaxation and increased filling pressure (grade 2 diastolic dysfunction). - Mitral valve: Calcified annulus. Transthoracic echocardiography. M-mode, complete 2D, spectral Doppler, and color Doppler. Height: Height: 157.5cm. Height: 62in. Weight: Weight: 84.8kg. Weight: 186.6lb. Body mass index: BMI: 34.2kg/m^2. Body surface area: BSA: 1.79m^2. Blood pressure:  90/70. Patient status: Outpatient. Location: [place] [facility]  ------------------------------------------------------------  ------------------------------------------------------------ Left ventricle: The cavity size was normal. Wall thickness was normal. Systolic function was normal. The estimated ejection fraction was in the range of 55% to 60%. Regional wall motion abnormalities cannot be excluded. Features are consistent with a pseudonormal left ventricular filling pattern, with concomitant abnormal relaxation and increased filling pressure (grade 2 diastolic dysfunction).  ------------------------------------------------------------ Aortic valve: Structurally normal valve. Cusp separation was normal. Doppler: Transvalvular velocity was within the normal range. There was no stenosis. No regurgitation.  ------------------------------------------------------------ Aorta: Aortic root: The aortic root was normal in size.  ------------------------------------------------------------ Mitral valve:  Calcified annulus. Leaflet separation was normal. Doppler: Transvalvular velocity was within the normal range. There was no evidence for stenosis. No regurgitation. Peak gradient: 3mm Hg (D).  ------------------------------------------------------------ Left atrium: The atrium was normal in size.  ------------------------------------------------------------ Right ventricle: The cavity size was normal. Systolic function was normal.  ------------------------------------------------------------ Pulmonic valve: Structurally normal valve. Cusp separation was normal. Doppler: Transvalvular velocity was within the normal range. No regurgitation.  ------------------------------------------------------------ Tricuspid valve: Structurally normal valve. Leaflet separation was normal. Doppler: Transvalvular velocity was within the normal range. Trivial  regurgitation.  ------------------------------------------------------------ Pulmonary artery: Systolic pressure was within the normal range.  ------------------------------------------------------------ Right atrium: The atrium was normal in size.  ------------------------------------------------------------ Pericardium: There was no pericardial effusion.  ------------------------------------------------------------ Systemic veins: Inferior vena cava: The vessel was normal in size; the respirophasic diameter changes were in the normal range (= 50%); findings are consistent with normal central venous pressure.  ------------------------------------------------------------  2D measurements Normal Doppler measurements Normal Left ventricle Main pulmonary LVID ED, 31 mm 43-52 artery chord, Pressure, S 22 mm Hg =30 PLAX Left ventricle LVID ES, 21.9 mm 23-38 Ea, lat 9.5 cm/s ------ chord, ann, tiss 4 PLAX DP FS, chord, 29 % >29 E/Ea, lat 9.5 ------ PLAX ann, tiss 7 LVPW, ED 9.85 mm ------ DP IVS/LVPW 1.34 <1.3 Ea, med 8.0 cm/s ------ ratio, ED ann, tiss 1 Ventricular septum DP IVS, ED 13.2 mm ------ E/Ea, med 11. ------ LVOT ann, tiss 4 Diam, S 17 mm ------ DP Area 2.27 cm^2 ------ LVOT Diam 17 mm ------ Peak vel, S 102 cm/s ------ Aorta VTI, S 21. cm ------ Root diam, 27 mm ------ 6 ED Stroke vol 49 ml ------ Left atrium Stroke 26. ml/m^2 ------ AP dim 33 mm ------ index 4 AP dim 1.77 cm/m^2 <2.2 Mitral valve index Peak E vel 91. cm/s ------ 3 Peak A vel 75. cm/s ------ 5 Deceleratio 222 ms 150-23 n time 0 Peak 3 mm Hg ------ gradient, D Peak E/A 1.2 ------ ratio Tricuspid valve Regurg peak 205 cm/s ------ vel Peak RV-RA 17 mm Hg ------ gradient, S Systemic veins Estimated 5 mm Hg ------ CVP Right ventricle Pressure, S 22 mm Hg <30 Sa vel, lat 12. cm/s ------ ann, tiss 3 DP  ------------------------------------------------------------ Prepared and  Electronically Authenticated by  Olga Millers 2014-02-10T13:09:[id number]    Assessment / Plan: 1. Dyspnea. Etiology is unclear. Patient is clearly hyperventilating and has a respiratory alkalosis but her evaluation to date has been unrevealing. Ordinarily I think a cardiopulmonary stress test would be beneficial here but I do not feel that she can perform well enough to give [place] an adequate stress test. I have recommended a right heart catheterization to rule out pulmonary hypertension and to evaluate her left ventricular filling pressures. If this is unremarkable then I think we can safely say that her dyspnea is not cardiac related.

## 2012-11-16 ENCOUNTER — Emergency Department (HOSPITAL_COMMUNITY)
Admission: EM | Admit: 2012-11-16 | Discharge: 2012-11-17 | Disposition: A | Discharge status: Home / Self Care | Payer: BC Managed Care – PPO | Attending: Emergency Medicine | Admitting: Emergency Medicine

## 2012-11-16 ENCOUNTER — Encounter (HOSPITAL_COMMUNITY): Payer: Self-pay | Admitting: Emergency Medicine

## 2012-11-16 DIAGNOSIS — E039 Hypothyroidism, unspecified: Secondary | ICD-10-CM | POA: Insufficient documentation

## 2012-11-16 DIAGNOSIS — R0602 Shortness of breath: Secondary | ICD-10-CM | POA: Insufficient documentation

## 2012-11-16 DIAGNOSIS — IMO0001 Reserved for inherently not codable concepts without codable children: Secondary | ICD-10-CM | POA: Insufficient documentation

## 2012-11-16 DIAGNOSIS — IMO0002 Reserved for concepts with insufficient information to code with codable children: Secondary | ICD-10-CM | POA: Insufficient documentation

## 2012-11-16 DIAGNOSIS — Z8739 Personal history of other diseases of the musculoskeletal system and connective tissue: Secondary | ICD-10-CM | POA: Insufficient documentation

## 2012-11-16 DIAGNOSIS — Z79899 Other long term (current) drug therapy: Secondary | ICD-10-CM | POA: Insufficient documentation

## 2012-11-16 DIAGNOSIS — M549 Dorsalgia, unspecified: Secondary | ICD-10-CM | POA: Insufficient documentation

## 2012-11-16 DIAGNOSIS — E785 Hyperlipidemia, unspecified: Secondary | ICD-10-CM | POA: Insufficient documentation

## 2012-11-16 DIAGNOSIS — Z8701 Personal history of pneumonia (recurrent): Secondary | ICD-10-CM | POA: Insufficient documentation

## 2012-11-16 DIAGNOSIS — R0789 Other chest pain: Secondary | ICD-10-CM | POA: Insufficient documentation

## 2012-11-16 LAB — CBC
MCH: 32.2 pg (ref 26.0–34.0)
MCHC: 36 g/dL (ref 30.0–36.0)
MCV: 89.7 fL (ref 78.0–100.0)
Platelets: 182 10*3/uL (ref 150–400)
RBC: 3.69 MIL/uL — ABNORMAL LOW (ref 3.87–5.11)
RDW: 13.7 % (ref 11.5–15.5)

## 2012-11-16 NOTE — ED Notes (Signed)
Pt reports pain in back between scapula. Upon respiratory assessment pt jerked away from stethoscope and reported that it hurt when pressure is applied on Right upper side of back. Pt reports that this pain is different, that it is a soreness, and has been there for a few months.

## 2012-11-16 NOTE — ED Notes (Signed)
Per EMS, Pt has had previous CP episodes like this in the past. Pt has seen several specialist, but no affirmative diagnosis. Left Side sharp pain, consistent, which radiates to Back between shoulder blades, and pt states that sometimes it radiates to her neck. Pt has inverted T wave and P wave in V1 lead. Pt suffers from fatigue doing normal actives, and has SOB with no exertion. Pt denies N/V, pt is warm and dry. Vitals stable. A/o x4. Pt received 324 asa en route to ED.

## 2012-11-16 NOTE — ED Notes (Signed)
Pt transported to XR.  

## 2012-11-17 ENCOUNTER — Emergency Department (HOSPITAL_COMMUNITY): Payer: BC Managed Care – PPO

## 2012-11-17 LAB — POCT I-STAT TROPONIN I: Troponin i, poc: 0 ng/mL (ref 0.00–0.08)

## 2012-11-17 LAB — COMPREHENSIVE METABOLIC PANEL
Albumin: 3.7 g/dL (ref 3.5–5.2)
BUN: 13 mg/dL (ref 6–23)
Calcium: 9.3 mg/dL (ref 8.4–10.5)
Chloride: 103 mEq/L (ref 96–112)
Creatinine, Ser: 0.57 mg/dL (ref 0.50–1.10)
GFR calc non Af Amer: >90 mL/min (ref >90)
Total Bilirubin: 0.5 mg/dL (ref 0.3–1.2)

## 2012-11-17 LAB — PRO B NATRIURETIC PEPTIDE: Pro B Natriuretic peptide (BNP): 5.6 pg/mL (ref 0–125)

## 2012-11-17 LAB — POCT I-STAT, CHEM 8
Chloride: 105 mEq/L (ref 96–112)
HCT: 34 % — ABNORMAL LOW (ref 36.0–46.0)
Hemoglobin: 11.6 g/dL — ABNORMAL LOW (ref 12.0–15.0)
Potassium: 3.5 mEq/L (ref 3.5–5.1)
Sodium: 141 mEq/L (ref 135–145)

## 2012-11-17 LAB — D-DIMER, QUANTITATIVE: D-Dimer, Quant: <0.27 ug/mL-FEU (ref 0.00–0.48)

## 2012-11-17 MED ORDER — POTASSIUM CHLORIDE CRYS ER 20 MEQ PO TBCR
40.0000 meq | EXTENDED_RELEASE_TABLET | Freq: Once | ORAL | Status: AC
  Administered 2012-11-17: 40 meq via ORAL
  Filled 2012-11-17: qty 2

## 2012-11-17 MED ORDER — HYDROMORPHONE HCL PF 1 MG/ML IJ SOLN
1.0000 mg | Freq: Once | INTRAMUSCULAR | Status: AC
  Administered 2012-11-17: 1 mg via INTRAVENOUS
  Filled 2012-11-17: qty 1

## 2012-11-17 MED ORDER — ONDANSETRON HCL 4 MG/2ML IJ SOLN
4.0000 mg | Freq: Once | INTRAMUSCULAR | Status: AC
  Administered 2012-11-17: 4 mg via INTRAVENOUS
  Filled 2012-11-17: qty 2

## 2012-11-17 MED ORDER — PROMETHAZINE HCL 25 MG/ML IJ SOLN
25.0000 mg | Freq: Once | INTRAMUSCULAR | Status: AC
  Administered 2012-11-17: 25 mg via INTRAVENOUS
  Filled 2012-11-17: qty 1

## 2012-11-17 NOTE — ED Provider Notes (Signed)
Medical screening examination/treatment/procedure(s) were performed by non-physician practitioner and as supervising physician I was immediately available for consultation/collaboration.  Jasmine Awe, MD 11/17/12 [phone]

## 2012-11-17 NOTE — ED Notes (Signed)
PA at bedside.

## 2012-11-17 NOTE — ED Notes (Signed)
Family at bedside with pt.

## 2012-11-17 NOTE — ED Notes (Signed)
Pt asked when she was going to see MD, MD Palumbo notified.

## 2012-11-17 NOTE — ED Notes (Signed)
Pt is dyspneic at rest in bed, Pt placed on 2L for comfort. Sats are 100% on 2L via [place]

## 2012-11-17 NOTE — ED Notes (Signed)
Pt continues to have nausea at this time not relieved by zofran. Per PA Theron Arista, order 25mg  of Phenergan

## 2012-11-17 NOTE — ED Notes (Signed)
Pt. returned from XR. 

## 2012-11-17 NOTE — ED Notes (Signed)
Pt reporting relief of nausea after phenergan

## 2012-11-17 NOTE — ED Notes (Signed)
Pt feeling like shes going to throw up. Zofran Ordered

## 2012-11-17 NOTE — ED Notes (Signed)
Pt having dyspnea at rest in bed. Pt is displaying tachypnea and shallow respirations. Pt repositioned in bed to be sitting up right. Pt is satting at 100% on RA. Pt instructed to do pursed lip breathing and take deep breaths.

## 2012-11-17 NOTE — ED Provider Notes (Signed)
History     CSN: [id number]  Arrival date & time 11/16/12  2307   First MD Initiated Contact with Patient 11/17/12 0250      Chief Complaint  Patient presents with  . Chest Pain   HPI  History provided by the patient, father and recent medical chart. Patient is a 42 year old female with history of hyperlipidemia, hypothyroidism, vasovagal syncope and fibromyalgia who presents with complaints of continued chest pain with shortness of breath symptoms. Patient has multiple recent prior visits to emergency room for similar symptoms. She has had ongoing intermittent episodes of chest pain and shortness of breath for the past 2-3 months. She has been evaluated multiple times for these symptoms without clear explanation to the cause. Patient states that she has had continued symptoms have again become worse this evening. She reports a generalized fatigue and difficulty with shortness of breath especially on exertion. She has occasional intermittent sharp and pressure pains to her chest and sternum area. These symptoms have been unchanged for the past 2-3 months. She does report an upcoming appointment of her heart catheterization with Dr. [place] with cardiology. Patient has not had any changes to her symptoms. She denies any other new activities or environmental changes. Denies any aggravating or alleviating factors. Denies any other associated symptoms.    Past Medical History  Diagnosis Date  . Hypothyroidism   . Edema   . Hyperlipidemia   . Pneumonia   . Vasovagal syncope   . Sleep apnea   . Fibromyalgia   . Scoliosis   . Hx of cardiovascular stress test     Lex MV 2/14: EF 81%, no ischemia    Past Surgical History  Procedure Laterality Date  . Appendectomy    . Nasal sinus surgery    . Mouth surgery      Family History  Problem Relation Age of Onset  . CAD Maternal Grandmother   . Cancer Mother     ovarian  . Emphysema Maternal Uncle   . Heart disease Maternal Grandfather    . Heart disease Paternal Grandfather   . Heart disease Paternal Grandmother     History  Substance Use Topics  . Smoking status: Never Smoker   . Smokeless tobacco: Never Used  . Alcohol Use: No    OB History   Grav Para Term Preterm Abortions TAB SAB Ect Mult Living                  Review of Systems  Constitutional: Negative for fever and chills.  HENT: Negative for neck pain and neck stiffness.   Eyes: Negative for pain.  Respiratory: Positive for shortness of breath.   Cardiovascular: Positive for chest pain. Negative for palpitations and leg swelling.  Gastrointestinal: Negative for abdominal pain.  Musculoskeletal: Positive for back pain.  Skin: Negative for rash.  Neurological: Negative for headaches.  All other systems reviewed and are negative.    Allergies  Codeine; Hepatitis b virus vaccine; Imitrex; and Pneumococcal vaccines  Home Medications   Current Outpatient Rx  Name  Route  Sig  Dispense  Refill  . albuterol (PROVENTIL HFA;VENTOLIN HFA) 108 (90 BASE) MCG/ACT inhaler   Inhalation   Inhale 2 puffs into the lungs every 6 (six) hours as needed for wheezing.         . calcium carbonate (OS-CAL - DOSED IN MG OF ELEMENTAL CALCIUM) 1250 MG tablet   Oral   Take 1 tablet by mouth daily.         Marland Kitchen  chlorthalidone (HYGROTON) 25 MG tablet   Oral   Take 25 mg by mouth at bedtime as needed (for edema).          . famotidine (PEPCID) 20 MG tablet   Oral   Take 20 mg by mouth daily as needed for heartburn.         Marland Kitchen ibuprofen (ADVIL,MOTRIN) 400 MG tablet   Oral   Take 400 mg by mouth every 6 (six) hours as needed for pain.         Marland Kitchen levothyroxine (SYNTHROID, LEVOTHROID) 50 MCG tablet   Oral   Take 50 mcg by mouth daily before breakfast.          . mometasone-formoterol (DULERA) 200-5 MCG/ACT AERO   Inhalation   Inhale 1 puff into the lungs 2 (two) times daily.         . Multiple Vitamin (MULTIVITAMIN WITH MINERALS) TABS   Oral   Take  1 tablet by mouth every morning.          Marland Kitchen omega-3 acid ethyl esters (LOVAZA) 1 G capsule   Oral   Take 2 g by mouth daily.         Marland Kitchen OVER THE COUNTER MEDICATION   Transdermal   Place 1 application onto the skin as needed. Essential oil therapy Rosemary, Eucaliptus, Stress Away         . pantoprazole (PROTONIX) 40 MG tablet   Oral   Take 1 tablet (40 mg total) by mouth daily.   30 tablet   3   . traMADol (ULTRAM) 50 MG tablet   Oral   Take 1 tablet (50 mg total) by mouth every 6 (six) hours as needed for pain.   15 tablet   0   . vitamin B-12 (CYANOCOBALAMIN) 1000 MCG tablet   Oral   Take 1,000 mcg by mouth every morning.          . vitamin C (ASCORBIC ACID) 500 MG tablet   Oral   Take 1,000 mg by mouth every morning.            BP 109/70  Pulse 85  Temp(Src) 97.6 F (36.4 C) (Oral)  Resp 23  SpO2 100%  LMP 10/31/2012  Physical Exam  Nursing note and vitals reviewed. Constitutional: She is oriented to person, place, and time. She appears well-developed and well-nourished. No distress.  HENT:  Head: Normocephalic and atraumatic.  Mouth/Throat: Oropharynx is clear and moist. No oropharyngeal exudate.  Eyes: Conjunctivae and EOM are normal. Pupils are equal, round, and reactive to light.  Neck: Neck supple.  Cardiovascular: Normal rate, regular rhythm and intact distal pulses.   No murmur heard. Pulmonary/Chest: Effort normal and breath sounds normal. No respiratory distress. She has no wheezes. She has no rales. She exhibits no tenderness.  Abdominal: Soft. Bowel sounds are normal. She exhibits no distension. There is no tenderness.  Musculoskeletal: Normal range of motion. She exhibits no edema.  Neurological: She is alert and oriented to person, place, and time.  Skin: Skin is warm and dry.    ED Course  Procedures   Results for orders placed during the hospital encounter of 11/16/12  CBC      Result Value Range   WBC 6.8  4.0 - 10.5 K/uL    RBC 3.69 (*) 3.87 - 5.11 MIL/uL   Hemoglobin 11.9 (*) 12.0 - 15.0 g/dL   HCT 16.1 (*) 09.6 - 04.5 %   MCV 89.7  78.0 - 100.0  fL   MCH 32.2  26.0 - 34.0 pg   MCHC 36.0  30.0 - 36.0 g/dL   RDW 16.1  09.6 - 04.5 %   Platelets 182  150 - 400 K/uL  PRO B NATRIURETIC PEPTIDE      Result Value Range   Pro B Natriuretic peptide (BNP) 5.6  0 - 125 pg/mL  COMPREHENSIVE METABOLIC PANEL      Result Value Range   Sodium 139  135 - 145 mEq/L   Potassium 3.4 (*) 3.5 - 5.1 mEq/L   Chloride 103  96 - 112 mEq/L   CO2 23  19 - 32 mEq/L   Glucose, Bld 166 (*) 70 - 99 mg/dL   BUN 13  6 - 23 mg/dL   Creatinine, Ser 4.09  0.50 - 1.10 mg/dL   Calcium 9.3  8.4 - 81.1 mg/dL   Total Protein 7.2  6.0 - 8.3 g/dL   Albumin 3.7  3.5 - 5.2 g/dL   AST 36  0 - 37 U/L   ALT 46 (*) 0 - 35 U/L   Alkaline Phosphatase 108  39 - 117 U/L   Total Bilirubin 0.5  0.3 - 1.2 mg/dL   GFR calc non Af Amer >90  >90 mL/min   GFR calc Af Amer >90  >90 mL/min  D-DIMER, QUANTITATIVE      Result Value Range   D-Dimer, Quant <0.27  0.00 - 0.48 ug/mL-FEU  POCT I-STAT, CHEM 8      Result Value Range   Sodium 141  135 - 145 mEq/L   Potassium 3.5  3.5 - 5.1 mEq/L   Chloride 105  96 - 112 mEq/L   BUN 13  6 - 23 mg/dL   Creatinine, Ser 9.14  0.50 - 1.10 mg/dL   Glucose, Bld 782 (*) 70 - 99 mg/dL   Calcium, Ion 9.56  2.13 - 1.23 mmol/L   TCO2 27  0 - 100 mmol/L   Hemoglobin 11.6 (*) 12.0 - 15.0 g/dL   HCT 08.6 (*) 57.8 - 46.9 %  POCT I-STAT TROPONIN I      Result Value Range   Troponin i, poc 0.00  0.00 - 0.08 ng/mL   Comment 3                Dg Chest 2 View  11/17/2012  *RADIOLOGY REPORT*  Clinical Data: Shortness of breath  CHEST - 2 VIEW  Comparison: 10/26/2012  Findings: Hypoaeration with interstitial and vascular crowding. Mild bibasilar atelectasis.  Cardiomediastinal contours within normal range.  No pleural effusion or pneumothorax.  Mild rightward curvature of the thoracolumbar spine.  No acute osseous finding.   IMPRESSION: Hypoaeration with mild bibasilar atelectasis.   Original Report Authenticated By: Jearld Lesch, M.D.      1. Atypical chest pain       MDM  Patient seen and evaluated. Patient appears well in no acute distress. Symptoms have not changed significantly over the past 2-1/2 months. Patient has outpatient specialty followup planned with an upcoming heart catheter.  Patient discussed with attending physician. Patient with multiple frequent visits to emergency room with similar symptoms unchanged today. Patient again has unremarkable workup. No significant changes EKG, negative troponin, negative d-dimer, negative BMP, negative chest x-ray. At this time do not suspect any emergent condition. Patient has upcoming heart catheterization plan with Dr. [place]. This time patient felt stable to be discharged home and followup with PCP and specialist.    Date: 11/16/2012  Rate:  98  Rhythm: normal sinus rhythm  QRS Axis: normal  Intervals: normal  ST/T Wave abnormalities: nonspecific T wave changes  Conduction Disutrbances:none  Narrative Interpretation:   Old EKG Reviewed: No significant changes    Angus Seller, PA-C 11/17/12 2053

## 2012-11-21 ENCOUNTER — Telehealth: Payer: Self-pay | Admitting: Internal Medicine

## 2012-11-21 ENCOUNTER — Telehealth: Payer: Self-pay | Admitting: *Deleted

## 2012-11-21 ENCOUNTER — Ambulatory Visit (INDEPENDENT_AMBULATORY_CARE_PROVIDER_SITE_OTHER): Payer: BC Managed Care – PPO | Admitting: *Deleted

## 2012-11-21 DIAGNOSIS — Z01818 Encounter for other preprocedural examination: Secondary | ICD-10-CM

## 2012-11-21 DIAGNOSIS — R0602 Shortness of breath: Secondary | ICD-10-CM

## 2012-11-21 DIAGNOSIS — R079 Chest pain, unspecified: Secondary | ICD-10-CM

## 2012-11-21 LAB — PROTIME-INR
INR: 0.99 (ref <1.50)
Prothrombin Time: 13.1 seconds (ref 11.6–15.2)

## 2012-11-21 NOTE — Telephone Encounter (Signed)
CY, please advise if you are okay with taking patient on and if so do you want in a new consult slot? Thanks.

## 2012-11-21 NOTE — Telephone Encounter (Signed)
Fine with  Me

## 2012-11-21 NOTE — Telephone Encounter (Signed)
Received call from Joan/ cath lab, pt needs pt/inr for cath tomorrow, pt called and will come today.

## 2012-11-21 NOTE — Telephone Encounter (Signed)
Spoke to patient she stated she will be having lab work done today for cath 11/22/12.

## 2012-11-21 NOTE — Telephone Encounter (Signed)
Dr Sherene Sires, please advise if okay to switch to [facility], thanks

## 2012-11-22 ENCOUNTER — Encounter (HOSPITAL_BASED_OUTPATIENT_CLINIC_OR_DEPARTMENT_OTHER)
Admission: RE | Disposition: A | Discharge status: Home / Self Care | Payer: Self-pay | Source: Ambulatory Visit | Attending: Cardiology

## 2012-11-22 ENCOUNTER — Inpatient Hospital Stay (HOSPITAL_BASED_OUTPATIENT_CLINIC_OR_DEPARTMENT_OTHER)
Admission: RE | Admit: 2012-11-22 | Discharge: 2012-11-22 | Disposition: A | Discharge status: Home / Self Care | Payer: BC Managed Care – PPO | Source: Ambulatory Visit | Attending: Cardiology | Admitting: Cardiology

## 2012-11-22 DIAGNOSIS — R0609 Other forms of dyspnea: Secondary | ICD-10-CM | POA: Insufficient documentation

## 2012-11-22 DIAGNOSIS — R0602 Shortness of breath: Secondary | ICD-10-CM | POA: Insufficient documentation

## 2012-11-22 DIAGNOSIS — R0989 Other specified symptoms and signs involving the circulatory and respiratory systems: Secondary | ICD-10-CM | POA: Insufficient documentation

## 2012-11-22 DIAGNOSIS — Z9289 Personal history of other medical treatment: Secondary | ICD-10-CM

## 2012-11-22 HISTORY — DX: Personal history of other medical treatment: Z92.89

## 2012-11-22 LAB — POCT I-STAT 3, VENOUS BLOOD GAS (G3P V)
Acid-base deficit: 2 mmol/L (ref 0.0–2.0)
O2 Saturation: 70 %
TCO2: 26 mmol/L (ref 0–100)
pH, Ven: 7.368 — ABNORMAL HIGH (ref 7.250–7.300)

## 2012-11-22 SURGERY — JV RIGHT HEART CATHETERIZATION

## 2012-11-22 MED ORDER — ACETAMINOPHEN 325 MG PO TABS: 650.0000 mg | ORAL_TABLET | ORAL | Status: DC | PRN

## 2012-11-22 MED ORDER — SODIUM CHLORIDE 0.9 % IV SOLN: 1.0000 mL/kg/h | INTRAVENOUS | Status: DC

## 2012-11-22 MED ORDER — ONDANSETRON HCL 4 MG/2ML IJ SOLN
4.0000 mg | Freq: Four times a day (QID) | INTRAMUSCULAR | Status: DC | PRN
  Administered 2012-11-22: 4 mg via INTRAVENOUS

## 2012-11-22 NOTE — CV Procedure (Signed)
   Cardiac Catheterization Procedure Note  Name: Destiny Sharp MRN: [id number] DOB: [birth year]  Procedure: Right Heart Cath  Indication: 42 year old white female with refractory symptoms of dyspnea. Cardiac evaluation to date has been unremarkable.   Procedural Details: The right groin was prepped, draped, and anesthetized with 1% lidocaine. Using the modified Seldinger technique a 7 French sheath was placed in the right femoral vein. A Swan-Ganz catheter was used for the right heart catheterization. Standard protocol was followed for recording of right heart pressures and sampling of oxygen saturations. Fick cardiac output was calculated. There were no immediate procedural complications. The patient was transferred to the post catheterization recovery area for further monitoring.  Procedural Findings: Hemodynamics RA 8/8 mean of 6 mmHg RV 28/9 mm Hg PA 21/10 mean 16 mmHg PCWP 11/11 mean 8 mmHg   Oxygen saturations: RA 71% PA 70% AO 96%  Cardiac Output (Fick) 6 L/min Cardiac Index (Fick) 3.2 L/min/m2    Final Conclusions:   Normal right heart and left ventricular filling pressures.  Recommendations: follow up with pulmonary. No cardiac cause for dyspnea found.   Theron Arista [facility] 11/22/2012 12:40 PM  11/22/2012, 12:38 PM

## 2012-11-22 NOTE — H&P (View-Only) (Signed)
Destiny Sharp Date of Birth: September 19, 1970 Medical Record #[id number]  History of Present Illness: Destiny Sharp is seen for follow up today. She is a 42 year old white female who is seen for evaluation of increasing dyspnea. She has a history of vasovagal syncope and chest pain. She had a cardiac catheterization in 2006 which showed normal coronary anatomy and normal LV function. EDP at that time was 19 mmHg. She has had multiple emergency department evaluations for chest pain and dyspnea. Her primary complaint today is that since early January she has noted a marked increase in shortness of breath. He states she cannot walk 30 feet now without getting out of breath. Sometimes it may take several hours for her to be able to catch her breath after a period of exertion. Previously she was able to walk a mile at the local elementary school track. He used to take about 15 minutes to walk lap. She can no longer do so.  Recent evaluation includes a normal chest x-ray. CT of the chest was negative for pulmonary emboli. There was some diffuse patchy groundglass attenuation consistent with early edema versus alveolitis. ECG tracings have been normal. Echocardiogram performed in February showed normal LV function and wall thickness. There is pseudo-normal filling. Valve function is normal. There was no pericardial effusion.the a Myoview study was normal showing no perfusion abnormality and ejection fraction of 81%. She was seen by pulmonary. Spirometry showed nonobstructive disease. Arterial blood gas showed respiratory alkalosis. She was treated for acid reflux disease without any improvement.  Current Outpatient Prescriptions on File Prior to Visit  Medication Sig Dispense Refill  . albuterol (PROVENTIL HFA;VENTOLIN HFA) 108 (90 BASE) MCG/ACT inhaler Inhale 2 puffs into the lungs every 6 (six) hours as needed for wheezing.      . calcium carbonate (OS-CAL - DOSED IN MG OF ELEMENTAL CALCIUM) 1250 MG tablet Take  1 tablet by mouth daily.      . chlorthalidone (HYGROTON) 25 MG tablet Take 25 mg by mouth every evening.       . famotidine (PEPCID) 20 MG tablet One at bedtime  30 tablet  2  . HYDROcodone-acetaminophen (NORCO/VICODIN) 5-325 MG per tablet Take 1 tablet by mouth every 6 (six) hours as needed for pain.  10 tablet  0  . ibuprofen (ADVIL,MOTRIN) 400 MG tablet Take 400 mg by mouth every 6 (six) hours as needed for pain.      Marland Kitchen levothyroxine (SYNTHROID, LEVOTHROID) 50 MCG tablet Take 50 mcg by mouth daily before breakfast.       . mometasone-formoterol (DULERA) 200-5 MCG/ACT AERO Inhale 1 puff into the lungs 2 (two) times daily.      . Multiple Vitamin (MULTIVITAMIN WITH MINERALS) TABS Take 1 tablet by mouth every morning.       Marland Kitchen omega-3 acid ethyl esters (LOVAZA) 1 G capsule Take 2 g by mouth daily.      . pantoprazole (PROTONIX) 40 MG tablet Take 1 tablet (40 mg total) by mouth daily.  30 tablet  3  . traMADol (ULTRAM) 50 MG tablet Take 1 tablet (50 mg total) by mouth every 6 (six) hours as needed for pain.  15 tablet  0  . vitamin B-12 (CYANOCOBALAMIN) 1000 MCG tablet Take 1,000 mcg by mouth every morning.       . vitamin C (ASCORBIC ACID) 500 MG tablet Take 1,000 mg by mouth every morning.        No current facility-administered medications on file prior to visit.  Allergies  Allergen Reactions  . Codeine Nausea And Vomiting  . Hepatitis B Virus Vaccine Itching  . Pneumococcal Vaccines Itching    Past Medical History  Diagnosis Date  . Hypothyroidism   . Edema   . Hyperlipidemia   . Pneumonia   . Vasovagal syncope   . Sleep apnea   . Fibromyalgia   . Scoliosis   . Hx of cardiovascular stress test     Lex MV 2/14: EF 81%, no ischemia    Past Surgical History  Procedure Laterality Date  . Appendectomy    . Nasal sinus surgery    . Mouth surgery      History  Smoking status  . Never Smoker   Smokeless tobacco  . Never Used    History  Alcohol Use No    Family  History  Problem Relation Age of Onset  . CAD Maternal Grandmother   . Cancer Mother     ovarian  . Emphysema Maternal Uncle   . Heart disease Maternal Grandfather   . Heart disease Paternal Grandfather   . Heart disease Paternal Grandmother     Review of Systems: As noted in history of present illness.  All other systems were reviewed and are negative.  Physical Exam: BP 104/78  Pulse 85  Ht 5\' 2"  (1.575 m)  Wt 191 lb 6.4 oz (86.818 kg)  BMI 35 kg/m2  SpO2 99%  LMP 10/07/2012 She is an obese white female who appears dyspneic at rest. She is hyperventilating. Her HEENT exam is unremarkable. Neck is without JVD or bruits. Lungs are clear. Cardiac exam reveals a regular rate and rhythm without gallop, murmur, or click. Abdomen is soft and nontender. No masses or bruits. Extremities reveal trace edema. Skin is warm and dry. Pedal pulses are palpable. Neurologic exam is nonfocal. Cranial nerves II through XII are intact. LABORATORY DATA: Cardiology Nuclear Med Study  Destiny Sharp is a 42 y.o. female MRN : [id number] DOB: 1971/07/13  Procedure Date: 10/31/2012  Nuclear Med Background  Indication for Stress Test: Evaluation for Ischemia, and Patient seen in hospital on 10-26-12 for Chest Pain, Enzymes negative  History: '06 Heart Cath: EF: 70% NL, 2/14 ECHO: EF: 55-60% Several ER visits for CP (-) enzymes  Cardiac Risk Factors: Carotid Disease, Family History - CAD and Lipids  Symptoms: Chest Pain, DOE, Fatigue, Fatigue with Exertion and SOB  Nuclear Pre-Procedure  Caffeine/Decaff Intake: None > 12 hrs  NPO After: 7:30pm   Lungs: clear  O2 Sat: 97% on room air.  IV 0.9% NS with Angio Cath: 24g   IV Site: R Forearm X 1, tolerated well  IV Started by: Irean Hong, RN   Chest Size (in): 46  Cup Size: C   Height: 5\' 2"  (1.575 m)  Weight: 189 lb (85.73 kg)   BMI: Body mass index is 34.56 kg/(m^2).  Tech Comments: No medications today. This patient was very symptomatic and had to be  reversed 2x with Aminophylline 150 mg, in order to completely resolve all the chest tightness and sob.   Nuclear Med Study  1 or 2 day study: 1 day  Stress Test Type: Eugenie Birks   Reading MD: Charlton Haws, MD  Order Authorizing Provider: Peter Swaziland, MD, and Tereso Newcomer, [facility]   Resting Radionuclide: Technetium 86m Sestamibi  Resting Radionuclide Dose: 11.0 mCi   Stress Radionuclide: Technetium 33m Sestamibi  Stress Radionuclide Dose: 33.0 mCi   Stress Protocol  Rest HR: 82  Stress HR: 121  Rest BP: 103/71  Stress BP: 109/64   Exercise Time (min): n/a  METS: n/a   Predicted Max HR: 179 bpm  % Max HR: 67.6 bpm  Rate Pressure Product: 16109  Dose of Adenosine (mg): n/a  Dose of Lexiscan: 0.4 mg   Dose of Atropine (mg): n/a  Dose of Dobutamine: n/a mcg/kg/min (at max HR)   Stress Test Technologist: Milana Na, EMT-P  Nuclear Technologist: Domenic Polite, CNMT   Rest Procedure: Myocardial perfusion imaging was performed at rest 45 minutes following the intravenous administration of Technetium 80m Sestamibi.  Rest ECG: NSR - Normal EKG  Stress Procedure: The patient received IV Lexiscan 0.4 mg over 15-seconds. Technetium 47m Sestamibi injected at 30-seconds. This patient had sob, fatigue, nausea, and chest tightness with Lexiscan. Quantitative spect images were obtained after a 45 minute delay.  Stress ECG: No significant change from baseline ECG  QPS  Raw Data Images: Normal; no motion artifact; normal heart/lung ratio.  Stress Images: Normal homogeneous uptake in all areas of the myocardium.  Rest Images: Normal homogeneous uptake in all areas of the myocardium.  Subtraction (SDS): Normal  Transient Ischemic Dilatation (Normal <1.22): 1.06  Lung/Heart Ratio (Normal <0.45): 0.37  Quantitative Gated Spect Images  QGS EDV: 52 ml  QGS ESV: 10 ml  Impression  Exercise Capacity: Lexiscan with no exercise.  BP Response: Normal blood pressure response.  Clinical Symptoms: fatigue,  dyspnea, nausea and chest tightness  ECG Impression: No significant ST segment change suggestive of ischemia.  Comparison with Prior Nuclear Study: No images to compare  Overall Impression: Normal stress nuclear study.  LV Ejection Fraction: 81%. LV Wall Motion: NL LV Function; NL Wall Motion  Charlton Haws   Transthoracic Echocardiography  Patient: Destiny Sharp, Destiny Sharp MR #: [id number] Study Date: 10/17/2012 Gender: F Age: 16 Height: 157.5cm Weight: 84.8kg BSA: 1.46m^2 Pt. Status: Room:  ATTENDING Odelia Gage, Sharol Harness PERFORMING [facility], [facility] SONOGRAPHER Junious Dresser, RDCS cc:  ------------------------------------------------------------ LV EF: 55% - 60%  ------------------------------------------------------------ Indications: Chest pain 786.51. Dyspnea 786.09.  ------------------------------------------------------------ History: PMH: Acquired from the patient and from the patient's chart. Chest pain, associated with Shortness of breath. Fatigue, dyspnea, and bilateral lower extremity edema. No prior cardiac history. Risk factors: Hypertension. Obese.  ------------------------------------------------------------ Study Conclusions  - Left ventricle: The cavity size was normal. Wall thickness was normal. Systolic function was normal. The estimated ejection fraction was in the range of 55% to 60%. Regional wall motion abnormalities cannot be excluded. Features are consistent with a pseudonormal left ventricular filling pattern, with concomitant abnormal relaxation and increased filling pressure (grade 2 diastolic dysfunction). - Mitral valve: Calcified annulus. Transthoracic echocardiography. M-mode, complete 2D, spectral Doppler, and color Doppler. Height: Height: 157.5cm. Height: 62in. Weight: Weight: 84.8kg. Weight: 186.6lb. Body mass index: BMI: 34.2kg/m^2. Body surface area: BSA: 1.48m^2. Blood pressure:  90/70. Patient status: Outpatient. Location: [facility]  ------------------------------------------------------------  ------------------------------------------------------------ Left ventricle: The cavity size was normal. Wall thickness was normal. Systolic function was normal. The estimated ejection fraction was in the range of 55% to 60%. Regional wall motion abnormalities cannot be excluded. Features are consistent with a pseudonormal left ventricular filling pattern, with concomitant abnormal relaxation and increased filling pressure (grade 2 diastolic dysfunction).  ------------------------------------------------------------ Aortic valve: Structurally normal valve. Cusp separation was normal. Doppler: Transvalvular velocity was within the normal range. There was no stenosis. No regurgitation.  ------------------------------------------------------------ Aorta: Aortic root: The aortic root was normal in size.  ------------------------------------------------------------ Mitral valve:  Calcified annulus. Leaflet separation was normal. Doppler: Transvalvular velocity was within the normal range. There was no evidence for stenosis. No regurgitation. Peak gradient: 3mm Hg (D).  ------------------------------------------------------------ Left atrium: The atrium was normal in size.  ------------------------------------------------------------ Right ventricle: The cavity size was normal. Systolic function was normal.  ------------------------------------------------------------ Pulmonic valve: Structurally normal valve. Cusp separation was normal. Doppler: Transvalvular velocity was within the normal range. No regurgitation.  ------------------------------------------------------------ Tricuspid valve: Structurally normal valve. Leaflet separation was normal. Doppler: Transvalvular velocity was within the normal range. Trivial  regurgitation.  ------------------------------------------------------------ Pulmonary artery: Systolic pressure was within the normal range.  ------------------------------------------------------------ Right atrium: The atrium was normal in size.  ------------------------------------------------------------ Pericardium: There was no pericardial effusion.  ------------------------------------------------------------ Systemic veins: Inferior vena cava: The vessel was normal in size; the respirophasic diameter changes were in the normal range (= 50%); findings are consistent with normal central venous pressure.  ------------------------------------------------------------  2D measurements Normal Doppler measurements Normal Left ventricle Main pulmonary LVID ED, 31 mm 43-52 artery chord, Pressure, S 22 mm Hg =30 PLAX Left ventricle LVID ES, 21.9 mm 23-38 Ea, lat 9.5 cm/s ------ chord, ann, tiss 4 PLAX DP FS, chord, 29 % >29 E/Ea, lat 9.5 ------ PLAX ann, tiss 7 LVPW, ED 9.85 mm ------ DP IVS/LVPW 1.34 <1.3 Ea, med 8.0 cm/s ------ ratio, ED ann, tiss 1 Ventricular septum DP IVS, ED 13.2 mm ------ E/Ea, med 11. ------ LVOT ann, tiss 4 Diam, S 17 mm ------ DP Area 2.27 cm^2 ------ LVOT Diam 17 mm ------ Peak vel, S 102 cm/s ------ Aorta VTI, S 21. cm ------ Root diam, 27 mm ------ 6 ED Stroke vol 49 ml ------ Left atrium Stroke 26. ml/m^2 ------ AP dim 33 mm ------ index 4 AP dim 1.77 cm/m^2 <2.2 Mitral valve index Peak E vel 91. cm/s ------ 3 Peak A vel 75. cm/s ------ 5 Deceleratio 222 ms 150-23 n time 0 Peak 3 mm Hg ------ gradient, D Peak E/A 1.2 ------ ratio Tricuspid valve Regurg peak 205 cm/s ------ vel Peak RV-RA 17 mm Hg ------ gradient, S Systemic veins Estimated 5 mm Hg ------ CVP Right ventricle Pressure, S 22 mm Hg <30 Sa vel, lat 12. cm/s ------ ann, tiss 3 DP  ------------------------------------------------------------ Prepared and  Electronically Authenticated by  Olga Millers 2014-02-10T13:09:[id number]    Assessment / Plan: 1. Dyspnea. Etiology is unclear. Patient is clearly hyperventilating and has a respiratory alkalosis but her evaluation to date has been unrevealing. Ordinarily I think a cardiopulmonary stress test would be beneficial here but I do not feel that she can perform well enough to give [place] an adequate stress test. I have recommended a right heart catheterization to rule out pulmonary hypertension and to evaluate her left ventricular filling pressures. If this is unremarkable then I think we can safely say that her dyspnea is not cardiac related.

## 2012-11-22 NOTE — Progress Notes (Signed)
Bedrest begins @ 1255, tegaderm dressing applied to right groin site by Venda Rodes, site level 0.

## 2012-11-22 NOTE — OR Nursing (Signed)
Discharge instructions reviewed and signed, pt stated understanding, ambulated in hall without difficulty, site level 0, transported to Dad's car via wheelchair

## 2012-11-22 NOTE — OR Nursing (Addendum)
Meal served 

## 2012-11-22 NOTE — Interval H&P Note (Signed)
History and Physical Interval Note:  11/22/2012 12:12 PM  Destiny Sharp  has presented today for surgery, with the diagnosis of cp  The various methods of treatment have been discussed with the patient and family. After consideration of risks, benefits and other options for treatment, the patient has consented to  Procedure(s): JV RIGHT HEART CATHETERIZATION (N/A) as a surgical intervention .  The patient's history has been reviewed, patient examined, no change in status, stable for surgery.  I have reviewed the patient's chart and labs.  Questions were answered to the patient's satisfaction.     Theron Arista [facility] 11/22/2012 12:12 PM

## 2012-11-23 NOTE — Telephone Encounter (Signed)
Per CY-yes we will see patient; please let her know this will be for pulmonary/sleep. Also, we will not be able to see her until next open consult slot on CY's schedule.

## 2012-11-23 NOTE — Telephone Encounter (Signed)
Called, spoke with pt.  She has been informed of office protocol regarding switching providers.  I assured pt we are in the process of working on this.   She was last seen by Dr. Sherene Sires in Feb 2014.  Reports her breathing has worsened since this OV.  States she gets out of breath walking 20-30 feet, has chest pain, and her legs feel heavy.  Pt had a heart cath done yesterday and reports it was normal.  I offered to schedule her as a sick visit with another provider while we are working on this.  Pt states she would "really like to see Dr. Maple Hudson first" as she also has sleep apnea and has been snoring.  Reports she does have a cpap that she wears.  Sleep study was done approx 3 years ago and was ordered by PCP, Dr. Herb Grays.   Dr. Maple Hudson, pls advise if you are ok with switch.  If so, pls advise if pt can be worked in.  Your next available in not until the beginning of May.  Thank you.

## 2012-11-23 NOTE — Telephone Encounter (Signed)
Pt is aware. She has been scheduled for 01/05/13 at 9:30am.

## 2012-11-23 NOTE — Telephone Encounter (Signed)
Pt has called back.  Would like to know what the status is & when she will know an answer.  Pt can be reached at [phone] or [phone].  Pt states that she is having difficulty breathing & chest pain. Antionette Fairy

## 2012-11-23 NOTE — Telephone Encounter (Signed)
Destiny Sharp, has this been addressed? Please advise, thanks.

## 2012-12-02 ENCOUNTER — Emergency Department (HOSPITAL_COMMUNITY): Payer: BC Managed Care – PPO

## 2012-12-02 ENCOUNTER — Emergency Department (HOSPITAL_COMMUNITY)
Admission: EM | Admit: 2012-12-02 | Discharge: 2012-12-02 | Disposition: A | Discharge status: Home / Self Care | Payer: BC Managed Care – PPO | Attending: Emergency Medicine | Admitting: Emergency Medicine

## 2012-12-02 ENCOUNTER — Encounter (HOSPITAL_COMMUNITY): Payer: Self-pay | Admitting: Emergency Medicine

## 2012-12-02 DIAGNOSIS — Z3202 Encounter for pregnancy test, result negative: Secondary | ICD-10-CM | POA: Insufficient documentation

## 2012-12-02 DIAGNOSIS — R1013 Epigastric pain: Secondary | ICD-10-CM | POA: Insufficient documentation

## 2012-12-02 DIAGNOSIS — E039 Hypothyroidism, unspecified: Secondary | ICD-10-CM | POA: Insufficient documentation

## 2012-12-02 DIAGNOSIS — Z8639 Personal history of other endocrine, nutritional and metabolic disease: Secondary | ICD-10-CM | POA: Insufficient documentation

## 2012-12-02 DIAGNOSIS — Z8669 Personal history of other diseases of the nervous system and sense organs: Secondary | ICD-10-CM | POA: Insufficient documentation

## 2012-12-02 DIAGNOSIS — R748 Abnormal levels of other serum enzymes: Secondary | ICD-10-CM | POA: Insufficient documentation

## 2012-12-02 DIAGNOSIS — R0600 Dyspnea, unspecified: Secondary | ICD-10-CM

## 2012-12-02 DIAGNOSIS — Z8701 Personal history of pneumonia (recurrent): Secondary | ICD-10-CM | POA: Insufficient documentation

## 2012-12-02 DIAGNOSIS — Z8739 Personal history of other diseases of the musculoskeletal system and connective tissue: Secondary | ICD-10-CM | POA: Insufficient documentation

## 2012-12-02 DIAGNOSIS — Z862 Personal history of diseases of the blood and blood-forming organs and certain disorders involving the immune mechanism: Secondary | ICD-10-CM | POA: Insufficient documentation

## 2012-12-02 DIAGNOSIS — R0609 Other forms of dyspnea: Secondary | ICD-10-CM | POA: Insufficient documentation

## 2012-12-02 DIAGNOSIS — R0989 Other specified symptoms and signs involving the circulatory and respiratory systems: Secondary | ICD-10-CM | POA: Insufficient documentation

## 2012-12-02 DIAGNOSIS — IMO0001 Reserved for inherently not codable concepts without codable children: Secondary | ICD-10-CM | POA: Insufficient documentation

## 2012-12-02 DIAGNOSIS — IMO0002 Reserved for concepts with insufficient information to code with codable children: Secondary | ICD-10-CM | POA: Insufficient documentation

## 2012-12-02 DIAGNOSIS — Z9089 Acquired absence of other organs: Secondary | ICD-10-CM | POA: Insufficient documentation

## 2012-12-02 DIAGNOSIS — K859 Acute pancreatitis without necrosis or infection, unspecified: Secondary | ICD-10-CM | POA: Insufficient documentation

## 2012-12-02 DIAGNOSIS — R0789 Other chest pain: Secondary | ICD-10-CM | POA: Insufficient documentation

## 2012-12-02 DIAGNOSIS — Z79899 Other long term (current) drug therapy: Secondary | ICD-10-CM | POA: Insufficient documentation

## 2012-12-02 DIAGNOSIS — R609 Edema, unspecified: Secondary | ICD-10-CM | POA: Insufficient documentation

## 2012-12-02 LAB — CBC
MCH: 32.6 pg (ref 26.0–34.0)
MCV: 91.9 fL (ref 78.0–100.0)
Platelets: 230 10*3/uL (ref 150–400)
RDW: 13.7 % (ref 11.5–15.5)

## 2012-12-02 LAB — COMPREHENSIVE METABOLIC PANEL
AST: 40 U/L — ABNORMAL HIGH (ref 0–37)
Albumin: 3.5 g/dL (ref 3.5–5.2)
Calcium: 9.1 mg/dL (ref 8.4–10.5)
Creatinine, Ser: 0.55 mg/dL (ref 0.50–1.10)

## 2012-12-02 LAB — POCT PREGNANCY, URINE: Preg Test, Ur: NEGATIVE

## 2012-12-02 LAB — URINALYSIS, ROUTINE W REFLEX MICROSCOPIC
Glucose, UA: NEGATIVE mg/dL
Hgb urine dipstick: NEGATIVE
Specific Gravity, Urine: 1.02 (ref 1.005–1.030)
Urobilinogen, UA: 0.2 mg/dL (ref 0.0–1.0)

## 2012-12-02 LAB — TROPONIN I: Troponin I: <0.3 ng/mL (ref <0.30)

## 2012-12-02 MED ORDER — IOHEXOL 300 MG/ML  SOLN
50.0000 mL | Freq: Once | INTRAMUSCULAR | Status: AC | PRN
  Administered 2012-12-02: 50 mL via ORAL

## 2012-12-02 MED ORDER — HYDROMORPHONE HCL PF 1 MG/ML IJ SOLN
0.5000 mg | Freq: Once | INTRAMUSCULAR | Status: AC
  Administered 2012-12-02: 0.5 mg via INTRAVENOUS
  Filled 2012-12-02: qty 1

## 2012-12-02 MED ORDER — SODIUM CHLORIDE 0.9 % IV SOLN: INTRAVENOUS | Status: DC

## 2012-12-02 MED ORDER — PROMETHAZINE HCL 25 MG/ML IJ SOLN
25.0000 mg | Freq: Once | INTRAMUSCULAR | Status: AC
  Administered 2012-12-02: 25 mg via INTRAVENOUS
  Filled 2012-12-02: qty 1

## 2012-12-02 MED ORDER — SODIUM CHLORIDE 0.9 % IV BOLUS (SEPSIS)
1000.0000 mL | Freq: Once | INTRAVENOUS | Status: AC
  Administered 2012-12-02: 1000 mL via INTRAVENOUS

## 2012-12-02 MED ORDER — PERCOCET 5-325 MG PO TABS: 1.0000 | ORAL_TABLET | Freq: Four times a day (QID) | ORAL | Status: DC | PRN

## 2012-12-02 MED ORDER — PROMETHAZINE HCL 25 MG PO TABS: 25.0000 mg | ORAL_TABLET | Freq: Four times a day (QID) | ORAL | Status: DC | PRN

## 2012-12-02 NOTE — ED Provider Notes (Signed)
History     CSN: [id number]  Arrival date & time 12/02/12  1030   First MD Initiated Contact with Patient 12/02/12 1047      Chief Complaint  Patient presents with  . Pancreatitis  . Abdominal Pain  . Chest Pain    (Consider location/radiation/quality/duration/timing/severity/associated sxs/prior treatment) HPI Comments: INDIANNA Sharp is a 42 y.o. female with a history of hypothyroidism, fibromyalgia and vasovagal syncope presents to the emergency department complaining of epigastric abdominal pain.  Patient was sent to the emergency department from her primary care provider Dr. Collins Scotland for having an elevated lipase.  Lab work was drawn yesterday.  Patient states that she's been suffering intermittent epigastric and substernal chest pain with associated dyspnea for months now and has been evaluated by cardiology without any clear cardiac etiology (cardiac cath 3/18).  Abdominal pain is described as epigastric and sharp with radiation to back.  Severity 6/10.  Worsened by palpation.  No known alleviating factors.  Patient denies change in appetite, bowel movements, nausea, vomiting, diarrhea, fever, night sweats, chills, melena, cough, hemoptysis.  No other complaints at this time.  The history is provided by the patient, medical records and a caregiver.    Past Medical History  Diagnosis Date  . Hypothyroidism   . Edema   . Hyperlipidemia   . Pneumonia   . Vasovagal syncope   . Sleep apnea   . Fibromyalgia   . Scoliosis   . Hx of cardiovascular stress test     Lex MV 2/14: EF 81%, no ischemia    Past Surgical History  Procedure Laterality Date  . Appendectomy    . Nasal sinus surgery    . Mouth surgery      Family History  Problem Relation Age of Onset  . CAD Maternal Grandmother   . Cancer Mother     ovarian  . Emphysema Maternal Uncle   . Heart disease Maternal Grandfather   . Heart disease Paternal Grandfather   . Heart disease Paternal Grandmother      History  Substance Use Topics  . Smoking status: Never Smoker   . Smokeless tobacco: Never Used  . Alcohol Use: No    OB History   Grav Para Term Preterm Abortions TAB SAB Ect Mult Living                  Review of Systems  All other systems reviewed and are negative.    Allergies  Codeine; Hepatitis b virus vaccine; Imitrex; and Pneumococcal vaccines  Home Medications   Current Outpatient Rx  Name  Route  Sig  Dispense  Refill  . albuterol (PROVENTIL HFA;VENTOLIN HFA) 108 (90 BASE) MCG/ACT inhaler   Inhalation   Inhale 2 puffs into the lungs every 6 (six) hours as needed for wheezing.         . calcium carbonate (OS-CAL - DOSED IN MG OF ELEMENTAL CALCIUM) 1250 MG tablet   Oral   Take 1 tablet by mouth daily.         . chlorthalidone (HYGROTON) 25 MG tablet   Oral   Take 25 mg by mouth at bedtime as needed (for edema).          . famotidine (PEPCID) 20 MG tablet   Oral   Take 20 mg by mouth daily as needed for heartburn.         Marland Kitchen ibuprofen (ADVIL,MOTRIN) 400 MG tablet   Oral   Take 400 mg by mouth every 8 (  eight) hours as needed for pain.          Marland Kitchen levothyroxine (SYNTHROID, LEVOTHROID) 50 MCG tablet   Oral   Take 50 mcg by mouth daily before breakfast.          . mometasone-formoterol (DULERA) 200-5 MCG/ACT AERO   Inhalation   Inhale 1 puff into the lungs 2 (two) times daily.         . Multiple Vitamin (MULTIVITAMIN WITH MINERALS) TABS   Oral   Take 1 tablet by mouth every morning.          Marland Kitchen omega-3 acid ethyl esters (LOVAZA) 1 G capsule   Oral   Take 2 g by mouth daily.         Marland Kitchen OVER THE COUNTER MEDICATION   Transdermal   Place 1 application onto the skin as needed. Essential oil therapy Rosemary, Eucaliptus, Stress Away         . pantoprazole (PROTONIX) 40 MG tablet   Oral   Take 1 tablet (40 mg total) by mouth daily.   30 tablet   3   . traMADol (ULTRAM) 50 MG tablet   Oral   Take 1 tablet (50 mg total) by  mouth every 6 (six) hours as needed for pain.   15 tablet   0   . vitamin B-12 (CYANOCOBALAMIN) 1000 MCG tablet   Oral   Take 1,000 mcg by mouth every morning.          . vitamin C (ASCORBIC ACID) 500 MG tablet   Oral   Take 1,000 mg by mouth every morning.            BP 127/77  Pulse 94  Temp(Src) 98 F (36.7 C) (Oral)  Resp 11  SpO2 97%  LMP 10/31/2012  Physical Exam  Nursing note and vitals reviewed. Constitutional: She is oriented to person, place, and time. She appears well-developed and well-nourished. No distress.  HENT:  Head: Normocephalic and atraumatic.  Eyes: Conjunctivae and EOM are normal.  Neck: Normal range of motion.  Cardiovascular: Normal rate.   Regular rate rhythm, intact distal pulses.  No pitting edema bilaterally.  Pulmonary/Chest: Effort normal.  Lungs fair to auscultation bilaterally.  Normal breathing effort.  Abdominal:  Normal bowel sounds, soft abdomen.  Abdominal tenderness to palpation located primarily in the epigastric and left upper quadrant.  Musculoskeletal: Normal range of motion.  Neurological: She is alert and oriented to person, place, and time.  Skin: Skin is warm and dry. No rash noted. She is not diaphoretic.  Psychiatric: She has a normal mood and affect. Her behavior is normal.    ED Course  Procedures (including critical care time)  Labs Reviewed  CBC - Abnormal; Notable for the following:    RBC 3.47 (*)    Hemoglobin 11.3 (*)    HCT 31.9 (*)    All other components within normal limits  LIPASE, BLOOD  COMPREHENSIVE METABOLIC PANEL  URINALYSIS, ROUTINE W REFLEX MICROSCOPIC  TROPONIN I   No results found.   No diagnosis found.   Date: 12/02/2012  Rate: 93  Rhythm: normal sinus rhythm  QRS Axis: normal  Intervals: normal  ST/T Wave abnormalities: normal  Conduction Disutrbances: none  Narrative Interpretation:   Old EKG Reviewed: No significant changes noted     MDM  42 year old female presents  emergency department complaining of epigastric pain with referral from primary care provider after finding elevated lipase yesterday.  Patient reports having symptoms for months  with evaluation by cardiology, however no clear etiology has been discovered.  Patient has had 8 emergency department visits in the last 6 months with similar presentation with associated dyspnea. Pt was cleared by cardiology, Dr. Swaziland, with cath done on 3/28. Today's labs however did not show an elevated lipase.  Results discussed with patient and nursing advised to contact primary care physician for possible lab error.  At this time there does not appear to be any evidence of an acute emergency medical condition and the patient appears stable for discharge with appropriate outpatient follow up.Diagnosis was discussed with patient who verbalizes understanding and is agreeable to discharge. Pt case discussed with Dr. Oletta Lamas who agrees with my plan.          Jaci Carrel, [place] 12/02/12 1318

## 2012-12-02 NOTE — ED Provider Notes (Signed)
Medical screening examination/treatment/procedure(s) were performed by non-physician practitioner and as supervising physician I was immediately available for consultation/collaboration.   Gavin Pound. [address], MD 12/02/12 1323

## 2012-12-02 NOTE — ED Notes (Signed)
Pt sent from PCP for elevated lipase.  Hx of pancreatitis.  Pt states she has had abd pain for 4 years, worse this am.  Pt reports nausea, but no diarrhea/emesis.

## 2012-12-02 NOTE — ED Notes (Signed)
Pt finished with CT oral contrast. CT notified. 

## 2012-12-02 NOTE — ED Notes (Signed)
Went to D/C pt, pt upset at pain control.  PA informed and will speak to pt.

## 2012-12-02 NOTE — ED Notes (Addendum)
Pt aware of the need for a urine sample. Pt unable to void at this time. 

## 2012-12-06 ENCOUNTER — Encounter: Payer: Self-pay | Admitting: *Deleted

## 2012-12-06 ENCOUNTER — Encounter: Payer: Self-pay | Admitting: Cardiology

## 2012-12-07 ENCOUNTER — Encounter: Payer: BC Managed Care – PPO | Admitting: Physician Assistant

## 2012-12-14 ENCOUNTER — Ambulatory Visit (INDEPENDENT_AMBULATORY_CARE_PROVIDER_SITE_OTHER): Payer: BC Managed Care – PPO | Admitting: Physician Assistant

## 2012-12-14 VITALS — BP 124/88 | HR 86 | Ht 62.0 in | Wt 190.0 lb

## 2012-12-14 DIAGNOSIS — R0602 Shortness of breath: Secondary | ICD-10-CM

## 2012-12-14 NOTE — Patient Instructions (Addendum)
Your physician recommends that you continue on your current medications as directed. Please refer to the Current Medication list given to you today.   Your physician recommends that you Follow-up AS NEEDED

## 2012-12-14 NOTE — Progress Notes (Signed)
[address], [address] [place], [place]  [address] Phone: [phone], Fax:  [phone]  Date:  12/14/2012   ID:  Destiny Sharp, DOB 24-Apr-1971, MRN [id number]  PCP:  Herb Grays, MD  Primary Cardiologist:  Dr. Peter Swaziland     History of Present Illness: Destiny Sharp is a 42 y.o. female who returns for f/u after a recent RHC.  She has a long hx of chest pain and vagal syncope.  LHC 6/06 by Dr. Swaziland with normal coronary arteries.  Carotid dopplers 2010: 20-39% bilat ICA.  She has had multiple trips to the ED for chest pain.  Saw Dr. Jens Som 09/2012 after trip to the ED.  CXR, venous dopplers, DDimer, BNP all unremarkable.  Troponin was normal. Follow up was recommended as needed.  Echo 2/14:  EF 55-60%, grade 2 diast dysfn, MAC.  She has been to the ED multiple times.  She has described NYHA class III symptoms.  She has seen Dr. Sherene Sires in the past with pulmonology without clear lung cause for her symptoms.    Stress test in 10/2012 was normal.  Recently saw Dr. Peter Swaziland who arranged RHC.  This was normal and he suggested f/u with pulmonary.  She comes in again today in a wheelchair.  She notes severe dyspnea with just minimal activity.  She continues to have chest pain.  She did go to the ED recently with potential pancreatitis.  But, she apparently had no signs of pancreatitis and was sent home.  She tells me she went to an "alternative therapist" recently who did a "foot detox" that helped her breathing for about 12 hours.     Chest CTA 10/12/12: IMPRESSION:  Negative for significant acute pulmonary embolus by CTA.  Low volume exam with scattered diffuse bilateral patchy ground-glass attenuation which could represent early edema/volume overload or mild alveolitis.  CXR 10/26/12: IMPRESSION:  No active cardiopulmonary disease.  Lexiscan Myoview 10/2012:  EF 81%, no ischemia  PFTs 10/2012:  Mild restriction;  FEV1: 73%  FEV1/FVC: 103%  RHC 11/22/12:  RA 8/8, mean 6,  RV 28/9, PA 21/10, mean 16, PCWP 11/11 mean 8, CO 6, CI 3.2 (normal right and left hear pressures).    Labs (2/14):  K 3.9, creatinine 0.60, BNP 38, Hgb 11.6 Labs (3/14):  K 3.6, Cr 0.55, ALT 51, Tn < 0.3, Hgb 11.3   Wt Readings from Last 3 Encounters:  12/14/12 190 lb (86.183 kg)  11/22/12 191 lb (86.637 kg)  11/22/12 191 lb (86.637 kg)     Past Medical History  Diagnosis Date  . Hypothyroidism   . Edema   . Hyperlipidemia   . Pneumonia   . Vasovagal syncope   . Sleep apnea   . Fibromyalgia   . Scoliosis   . Hx of cardiovascular stress test     Lex MV 2/14: EF 81%, no ischemia    Current Outpatient Prescriptions  Medication Sig Dispense Refill  . albuterol (PROVENTIL HFA;VENTOLIN HFA) 108 (90 BASE) MCG/ACT inhaler Inhale 2 puffs into the lungs every 6 (six) hours as needed for wheezing.      . calcium carbonate (OS-CAL - DOSED IN MG OF ELEMENTAL CALCIUM) 1250 MG tablet Take 1 tablet by mouth daily.      . chlorthalidone (HYGROTON) 25 MG tablet Take 25 mg by mouth at bedtime as needed (for edema).       . famotidine (PEPCID) 20 MG tablet Take 20 mg by mouth daily as needed  for heartburn.      Marland Kitchen ibuprofen (ADVIL,MOTRIN) 400 MG tablet Take 400 mg by mouth every 8 (eight) hours as needed for pain.       Marland Kitchen levothyroxine (SYNTHROID, LEVOTHROID) 50 MCG tablet Take 50 mcg by mouth daily before breakfast.       . mometasone-formoterol (DULERA) 200-5 MCG/ACT AERO Inhale 1 puff into the lungs 2 (two) times daily.      . Multiple Vitamin (MULTIVITAMIN WITH MINERALS) TABS Take 1 tablet by mouth every morning.       Marland Kitchen omega-3 acid ethyl esters (LOVAZA) 1 G capsule Take 2 g by mouth daily.      Marland Kitchen OVER THE COUNTER MEDICATION Place 1 application onto the skin as needed. Essential oil therapy Rosemary, Eucaliptus, Stress Away      . pantoprazole (PROTONIX) 40 MG tablet Take 1 tablet (40 mg total) by mouth daily.  30 tablet  3  . PERCOCET 5-325 MG per tablet Take 1 tablet by mouth every 6 (six)  hours as needed for pain.  15 tablet  0  . promethazine (PHENERGAN) 25 MG tablet Take 1 tablet (25 mg total) by mouth every 6 (six) hours as needed for nausea.  10 tablet  0  . traMADol (ULTRAM) 50 MG tablet Take 1 tablet (50 mg total) by mouth every 6 (six) hours as needed for pain.  15 tablet  0  . vitamin B-12 (CYANOCOBALAMIN) 1000 MCG tablet Take 1,000 mcg by mouth every morning.       . vitamin C (ASCORBIC ACID) 500 MG tablet Take 1,000 mg by mouth every morning.        No current facility-administered medications for this visit.    Allergies:    Allergies  Allergen Reactions  . Codeine Nausea And Vomiting  . Hepatitis B Virus Vaccine Itching  . Imitrex (Sumatriptan) Other (See Comments)    vomitting  . Pneumococcal Vaccines Itching    Social History:  The patient  reports that she has never smoked. She has never used smokeless tobacco. She reports that she does not drink alcohol or use illicit drugs.  She denies exposure to asbestos.  Has never worked in [employer].    PHYSICAL EXAM: VS:  BP 124/88  Pulse 86  Ht 5\' 2"  (1.575 m)  Wt 190 lb (86.183 kg)  BMI 34.74 kg/m2  SpO2 99%  LMP 10/31/2012 Well nourished, well developed, in no acute distress but tachypneic HEENT: normal Neck: no JVD at 90 degrees Cardiac:  normal S1, S2; RRR; no murmur Lungs:  clear to auscultation bilaterally, no wheezing, rhonchi or rales Abd: soft,  Ext: trace nonpitting bilateral edema; right groin without hematoma or bruit  Skin: warm and dry Neuro:  CNs 2-12 intact, no focal abnormalities noted  EKG:   NSR, HR 86, normal axis, no acute changes.     ASSESSMENT AND PLAN:  1. Dyspnea:  Etiology unclear.  She has had a negative cardiac workup.  She should follow up with pulmonary and her PCP for further evaluation and management. 2. Disposition:  F/u with Dr. Peter Swaziland as needed.   Luna Glasgow, PA-C  11:20 AM 12/14/2012

## 2013-01-03 ENCOUNTER — Telehealth: Payer: Self-pay | Admitting: Physician Assistant

## 2013-01-03 ENCOUNTER — Emergency Department (HOSPITAL_COMMUNITY): Payer: BC Managed Care – PPO

## 2013-01-03 ENCOUNTER — Encounter (HOSPITAL_COMMUNITY): Payer: Self-pay | Admitting: Emergency Medicine

## 2013-01-03 ENCOUNTER — Emergency Department (HOSPITAL_COMMUNITY)
Admission: EM | Admit: 2013-01-03 | Discharge: 2013-01-03 | Disposition: A | Discharge status: Home / Self Care | Payer: BC Managed Care – PPO | Attending: Emergency Medicine | Admitting: Emergency Medicine

## 2013-01-03 DIAGNOSIS — R0789 Other chest pain: Secondary | ICD-10-CM | POA: Insufficient documentation

## 2013-01-03 DIAGNOSIS — Z79899 Other long term (current) drug therapy: Secondary | ICD-10-CM | POA: Insufficient documentation

## 2013-01-03 DIAGNOSIS — R112 Nausea with vomiting, unspecified: Secondary | ICD-10-CM | POA: Insufficient documentation

## 2013-01-03 DIAGNOSIS — G8929 Other chronic pain: Secondary | ICD-10-CM

## 2013-01-03 DIAGNOSIS — IMO0001 Reserved for inherently not codable concepts without codable children: Secondary | ICD-10-CM | POA: Insufficient documentation

## 2013-01-03 DIAGNOSIS — E039 Hypothyroidism, unspecified: Secondary | ICD-10-CM | POA: Insufficient documentation

## 2013-01-03 DIAGNOSIS — Z862 Personal history of diseases of the blood and blood-forming organs and certain disorders involving the immune mechanism: Secondary | ICD-10-CM | POA: Insufficient documentation

## 2013-01-03 DIAGNOSIS — Z8669 Personal history of other diseases of the nervous system and sense organs: Secondary | ICD-10-CM | POA: Insufficient documentation

## 2013-01-03 DIAGNOSIS — Z8739 Personal history of other diseases of the musculoskeletal system and connective tissue: Secondary | ICD-10-CM | POA: Insufficient documentation

## 2013-01-03 DIAGNOSIS — R1013 Epigastric pain: Secondary | ICD-10-CM | POA: Insufficient documentation

## 2013-01-03 DIAGNOSIS — Z8639 Personal history of other endocrine, nutritional and metabolic disease: Secondary | ICD-10-CM | POA: Insufficient documentation

## 2013-01-03 DIAGNOSIS — Z8701 Personal history of pneumonia (recurrent): Secondary | ICD-10-CM | POA: Insufficient documentation

## 2013-01-03 DIAGNOSIS — F411 Generalized anxiety disorder: Secondary | ICD-10-CM | POA: Insufficient documentation

## 2013-01-03 DIAGNOSIS — K859 Acute pancreatitis without necrosis or infection, unspecified: Secondary | ICD-10-CM | POA: Insufficient documentation

## 2013-01-03 LAB — COMPREHENSIVE METABOLIC PANEL
ALT: 62 U/L — ABNORMAL HIGH (ref 0–35)
AST: 58 U/L — ABNORMAL HIGH (ref 0–37)
Alkaline Phosphatase: 87 U/L (ref 39–117)
CO2: 22 mEq/L (ref 19–32)
Chloride: 103 mEq/L (ref 96–112)
GFR calc non Af Amer: >90 mL/min (ref >90)
Potassium: 3.6 mEq/L (ref 3.5–5.1)
Sodium: 137 mEq/L (ref 135–145)
Total Bilirubin: 1.2 mg/dL (ref 0.3–1.2)

## 2013-01-03 LAB — CBC
HCT: 34.1 % — ABNORMAL LOW (ref 36.0–46.0)
MCH: 32 pg (ref 26.0–34.0)
MCV: 87.9 fL (ref 78.0–100.0)
RBC: 3.88 MIL/uL (ref 3.87–5.11)
WBC: 7 10*3/uL (ref 4.0–10.5)

## 2013-01-03 LAB — LIPASE, BLOOD: Lipase: 61 U/L — ABNORMAL HIGH (ref 11–59)

## 2013-01-03 MED ORDER — ONDANSETRON HCL 4 MG/2ML IJ SOLN
4.0000 mg | Freq: Once | INTRAMUSCULAR | Status: AC
  Administered 2013-01-03: 4 mg via INTRAVENOUS

## 2013-01-03 MED ORDER — GI COCKTAIL ~~LOC~~
30.0000 mL | Freq: Once | ORAL | Status: AC
  Administered 2013-01-03: 30 mL via ORAL
  Filled 2013-01-03: qty 30

## 2013-01-03 MED ORDER — ONDANSETRON HCL 4 MG/2ML IJ SOLN
INTRAMUSCULAR | Status: AC
  Filled 2013-01-03: qty 2

## 2013-01-03 MED ORDER — SODIUM CHLORIDE 0.9 % IV BOLUS (SEPSIS)
1000.0000 mL | Freq: Once | INTRAVENOUS | Status: AC
  Administered 2013-01-03: 1000 mL via INTRAVENOUS

## 2013-01-03 MED ORDER — NITROGLYCERIN 0.4 MG SL SUBL: 0.4000 mg | SUBLINGUAL_TABLET | SUBLINGUAL | Status: DC | PRN

## 2013-01-03 MED ORDER — ONDANSETRON HCL 4 MG PO TABS: 4.0000 mg | ORAL_TABLET | Freq: Four times a day (QID) | ORAL | Status: DC

## 2013-01-03 MED ORDER — HYDROMORPHONE HCL PF 1 MG/ML IJ SOLN
1.0000 mg | Freq: Once | INTRAMUSCULAR | Status: AC
  Administered 2013-01-03: 1 mg via INTRAVENOUS
  Filled 2013-01-03: qty 1

## 2013-01-03 MED ORDER — ASPIRIN 325 MG PO TABS
325.0000 mg | ORAL_TABLET | ORAL | Status: AC
Start: 2013-01-03 — End: 2013-01-03
  Administered 2013-01-03: 325 mg via ORAL
  Filled 2013-01-03: qty 1

## 2013-01-03 NOTE — ED Provider Notes (Signed)
History     CSN: [id number]  Arrival date & time 01/03/13  4098   First MD Initiated Contact with Patient 01/03/13 2001      Chief Complaint  Patient presents with  . Chest Pain    (Consider location/radiation/quality/duration/timing/severity/associated sxs/prior treatment) [name] presenting with chest pain. The history is provided by the patient, medical records and the spouse.  Chest Pain Pain location:  Substernal area and epigastric Pain quality: aching and dull   Pain radiates to:  Does not radiate Pain radiates to the back: no   Pain severity:  Mild Onset quality:  Gradual Duration:  4 months Timing:  Constant Progression:  Waxing and waning Chronicity:  Chronic Worsened by:  Nothing tried Ineffective treatments:  None tried Associated symptoms: abdominal pain (epigastric), anxiety, nausea and vomiting   Associated symptoms: no anorexia, no cough, no fever, no palpitations and no shortness of breath     Past Medical History  Diagnosis Date  . Hypothyroidism   . Edema   . Hyperlipidemia   . Pneumonia   . Vasovagal syncope   . Sleep apnea   . Fibromyalgia   . Scoliosis   . Hx of cardiovascular stress test     Lex MV 2/14: EF 81%, no ischemia    Past Surgical History  Procedure Laterality Date  . Appendectomy    . Nasal sinus surgery    . Mouth surgery      Family History  Problem Relation Age of Onset  . CAD Maternal Grandmother   . Cancer Mother     ovarian  . Emphysema Maternal Uncle   . Heart disease Maternal Grandfather   . Heart disease Paternal Grandfather   . Heart disease Paternal Grandmother     History  Substance Use Topics  . Smoking status: Never Smoker   . Smokeless tobacco: Never Used  . Alcohol Use: No    OB History   Grav Para Term Preterm Abortions TAB SAB Ect Mult Living                  Review of Systems  Constitutional: Negative for fever, chills, activity change and appetite change.  HENT:  Negative for neck pain and neck stiffness.   Respiratory: Negative for cough, chest tightness, shortness of breath and wheezing.   Cardiovascular: Positive for chest pain. Negative for palpitations and leg swelling.  Gastrointestinal: Positive for nausea, vomiting and abdominal pain (epigastric). Negative for diarrhea, constipation, blood in stool, abdominal distention, anal bleeding, rectal pain and anorexia.  Genitourinary: Negative for dysuria, decreased urine volume and difficulty urinating.  Skin: Negative for rash and wound.  Neurological: Negative for syncope and light-headedness.  Psychiatric/Behavioral: Negative for confusion and agitation.  All other systems reviewed and are negative.    Allergies  Codeine; Hepatitis b virus vaccine; Imitrex; and Pneumococcal vaccines  Home Medications   Current Outpatient Rx  Name  Route  Sig  Dispense  Refill  . albuterol (PROVENTIL HFA;VENTOLIN HFA) 108 (90 BASE) MCG/ACT inhaler   Inhalation   Inhale 2 puffs into the lungs every 6 (six) hours as needed for wheezing.         . calcium carbonate (OS-CAL - DOSED IN MG OF ELEMENTAL CALCIUM) 1250 MG tablet   Oral   Take 1 tablet by mouth daily.         . chlorthalidone (HYGROTON) 25 MG tablet   Oral   Take 25 mg by mouth at bedtime as  needed (for edema).          . famotidine (PEPCID) 20 MG tablet   Oral   Take 20 mg by mouth daily as needed for heartburn.         Marland Kitchen ibuprofen (ADVIL,MOTRIN) 400 MG tablet   Oral   Take 400 mg by mouth every 8 (eight) hours as needed for pain.          Marland Kitchen levothyroxine (SYNTHROID, LEVOTHROID) 50 MCG tablet   Oral   Take 50 mcg by mouth daily before breakfast.          . mometasone-formoterol (DULERA) 200-5 MCG/ACT AERO   Inhalation   Inhale 1 puff into the lungs 2 (two) times daily.         . Multiple Vitamin (MULTIVITAMIN WITH MINERALS) TABS   Oral   Take 1 tablet by mouth every morning.          Marland Kitchen omega-3 acid ethyl esters  (LOVAZA) 1 G capsule   Oral   Take 2 g by mouth daily.         Marland Kitchen OVER THE COUNTER MEDICATION   Transdermal   Place 1 application onto the skin as needed. Essential oil therapy Rosemary, Eucaliptus, Stress Away         . pantoprazole (PROTONIX) 40 MG tablet   Oral   Take 1 tablet (40 mg total) by mouth daily.   30 tablet   3   . PERCOCET 5-325 MG per tablet   Oral   Take 1 tablet by mouth every 6 (six) hours as needed for pain.   15 tablet   0     Dispense as written.   . promethazine (PHENERGAN) 25 MG tablet   Oral   Take 1 tablet (25 mg total) by mouth every 6 (six) hours as needed for nausea.   10 tablet   0   . traMADol (ULTRAM) 50 MG tablet   Oral   Take 1 tablet (50 mg total) by mouth every 6 (six) hours as needed for pain.   15 tablet   0   . vitamin B-12 (CYANOCOBALAMIN) 1000 MCG tablet   Oral   Take 1,000 mcg by mouth every morning.          . vitamin C (ASCORBIC ACID) 500 MG tablet   Oral   Take 1,000 mg by mouth every morning.            BP 116/73  Pulse 89  Temp(Src) 98.9 F (37.2 C) (Oral)  Resp 16  SpO2 99%  LMP 12/19/2012  Physical Exam  Nursing note and vitals reviewed. Constitutional: She is oriented to person, place, and time. She appears well-developed and well-nourished.  HENT:  Head: Normocephalic and atraumatic.  Right Ear: External ear normal.  Left Ear: External ear normal.  Nose: Nose normal.  Mouth/Throat: Oropharynx is clear and moist. No oropharyngeal exudate.  Eyes: Conjunctivae are normal. Pupils are equal, round, and reactive to light.  Neck: Normal range of motion. Neck supple.  Cardiovascular: Normal rate, regular rhythm, normal heart sounds and intact distal pulses.  Exam reveals no gallop and no friction rub.   No murmur heard. Pulmonary/Chest: Effort normal and breath sounds normal. No respiratory distress. She has no wheezes. She has no rales. She exhibits no tenderness.  Abdominal: Soft. Bowel sounds are  normal. She exhibits no distension and no mass. There is tenderness (epigastric moderate tenderness). There is no rebound and no guarding.  Musculoskeletal: Normal  range of motion. She exhibits no edema and no tenderness.  Neurological: She is alert and oriented to person, place, and time.  Skin: Skin is warm and dry.  Psychiatric: She has a normal mood and affect. Her behavior is normal. Judgment and thought content normal.    ED Course  Procedures (including critical care time)  Labs Reviewed  CBC - Abnormal; Notable for the following:    HCT 34.1 (*)    MCHC 36.4 (*)    All other components within normal limits  LIPASE, BLOOD - Abnormal; Notable for the following:    Lipase 61 (*)    All other components within normal limits  COMPREHENSIVE METABOLIC PANEL - Abnormal; Notable for the following:    Glucose, Bld 107 (*)    AST 58 (*)    ALT 62 (*)    All other components within normal limits  PRO B NATRIURETIC PEPTIDE  TROPONIN I  POCT I-STAT TROPONIN I   Dg Chest Port 1 View  01/03/2013  *RADIOLOGY REPORT*  Clinical Data: Chest pain and shortness of breath  PORTABLE CHEST - 1 VIEW  Comparison: 11/17/2012 and prior chest radiographs  Findings: The cardiomediastinal silhouette is unremarkable. The lungs are clear. There is no evidence of focal airspace disease, pulmonary edema, suspicious pulmonary nodule/mass, pleural effusion, or pneumothorax. No acute bony abnormalities are identified. This is a mildly low volume film.  IMPRESSION: No evidence of acute cardiopulmonary disease.   Original Report Authenticated By: Harmon Pier, M.D.     Date: 01/03/2013  Rate: 88 bpm  Rhythm: normal sinus rhythm  QRS Axis: normal  Intervals: normal  ST/T Wave abnormalities: normal  Conduction Disutrbances:none  Narrative Interpretation: Normal sinus rhythm without evidence of acute ischemia or arrythmia   Old EKG Reviewed: unchanged    1. Chronic chest pain   2. Pancreatitis       MDM   42 yo F w/hx of chronic chest pain and chronic pancreatitis presents for epigastric and substernal chest pain similar to previous pain she has had constantly for 4 months. EKG not c/w acute ischemia or arrythmia. Clinical picture not c/w ACS for this pt with 4 months of constant pain. Negative Murphy's sign and clinical picture not concerning for acute cholecystitis. Work-up c/w mild acute on chronic pancreatitis; pain and nausea treated symptomatically with resolution of symptoms in ED. Patient tolerating PO intake in ED and not clinically dehydrated. Patient given return precautions, including worsening of signs or symptoms. Patient instructed to follow-up with primary care physician.    Clemetine Marker, MD 01/03/13 2222

## 2013-01-03 NOTE — ED Provider Notes (Signed)
I saw and evaluated the patient, reviewed the resident's note and I agree with the findings and plan. On my exam this patient with a long history of chest pain was uncomfortable, but in no distress.  We discussed, at length the possible causes of her pain, and she remained hemodynamically stable throughout her emergency department course  Gerhard Munch, MD 01/03/13 2338

## 2013-01-03 NOTE — ED Notes (Signed)
PT. REPORTS PERSISTENT MID CHEST PAIN SINCE January THIS YEAR GOT WORSE PAST SEVERAL DAYS WITH SOB AND NAUSEA. NO MEDS PTA . DENIES FEVER OR COUGH . NO DIAPHORESIS.

## 2013-01-03 NOTE — Telephone Encounter (Signed)
Called by pt regarding chest pain, SOB and edema. She has gained > 25 lbs in the last month. She says is compliant with meds. Saw her primary MD today and was told to call cardiology.   Discussed situation with patient. Advised her she could only be evaluated tonight by coming to ER, recommended [facility]. Advised her she should not drive and should call EMS but if she could not call EMS, please do not drive. Pt stated she would not.

## 2013-01-03 NOTE — ED Notes (Signed)
Iv removed.  Pt still nauseated just received zofran  edp aware gingerale given

## 2013-01-05 ENCOUNTER — Ambulatory Visit (INDEPENDENT_AMBULATORY_CARE_PROVIDER_SITE_OTHER): Payer: BC Managed Care – PPO | Admitting: Internal Medicine

## 2013-01-05 ENCOUNTER — Encounter: Payer: Self-pay | Admitting: Internal Medicine

## 2013-01-05 VITALS — BP 140/78 | HR 94 | Ht 62.0 in | Wt 193.4 lb

## 2013-01-05 DIAGNOSIS — R0989 Other specified symptoms and signs involving the circulatory and respiratory systems: Secondary | ICD-10-CM

## 2013-01-05 DIAGNOSIS — R0600 Dyspnea, unspecified: Secondary | ICD-10-CM

## 2013-01-05 DIAGNOSIS — G4733 Obstructive sleep apnea (adult) (pediatric): Secondary | ICD-10-CM

## 2013-01-05 DIAGNOSIS — R079 Chest pain, unspecified: Secondary | ICD-10-CM

## 2013-01-05 DIAGNOSIS — R9389 Abnormal findings on diagnostic imaging of other specified body structures: Secondary | ICD-10-CM

## 2013-01-05 DIAGNOSIS — R0602 Shortness of breath: Secondary | ICD-10-CM

## 2013-01-05 NOTE — Patient Instructions (Addendum)
Order- Schedule PFT and 6 MWT    Dx dyspnea  Order- DME [facility]  Change CPAP to autotitration 5-15 x 7 days for pressure recommendation. Dx OSA

## 2013-01-05 NOTE — Progress Notes (Signed)
01/05/13- 42 yoF never smoker transferring from Dr Sherene Sires. Self referred- Father my pt, is here. Former [facility] patient for pulmonary-Increased SOB, chest pain and tightness.  Hx Cardiac w/u with clear coronaries on cath around 2006, no ischemia on nuc study, Nl EF w/ Gr II diastolic dysfunction, no pulmonary hypertension.  Office spirometry 10/27/12- FVC2.32/ 71%, FEV1 2.02/ 73%, FEV1/FVC 0.87, FEF 25-75% 94% CT chest 2/5 /14: IMPRESSION:  Negative for significant acute pulmonary embolus by CTA.  Low volume exam with scattered diffuse bilateral patchy ground-  glass attenuation which could represent early edema/volume overload  or mild alveolitis.  Original Report Authenticated By: Judie Petit. Miles Costain, M.D .Main concern for this visit:  Obstructive Sleep apnea Daytime sleepiness, snoring. Wakes tired.  NPSG- 12/15/10- Dr Asencion [facility]- RDI 6.8/ hr. CPAP 10,, [facility] DME, full face mask, humidifier Bedtime 10PM-MN, short latency, no WASO, up 5:00-5:30 AM. Describes easy DOE. Leaning on cart in store. Hx of pneumonia or bronchitis about 1x/ year. Pneumovax caused large local reaction. Denies hx of asthma. Some pollen rhinitis in Spring. Hx chest pain in January, 2014 at sternum, extending L arm, cleared by cardiology and dx'd w/ pancreatitis.  Says she is stressed by her job.  Prior to Admission medications   Medication Sig Start Date End Date Taking? Authorizing Provider  albuterol (PROVENTIL HFA;VENTOLIN HFA) 108 (90 BASE) MCG/ACT inhaler Inhale 2 puffs into the lungs every 6 (six) hours as needed for wheezing.   Yes Historical Provider, MD  calcium carbonate (OS-CAL - DOSED IN MG OF ELEMENTAL CALCIUM) 1250 MG tablet Take 1 tablet by mouth daily.   Yes Historical Provider, MD  chlorthalidone (HYGROTON) 25 MG tablet Take 25 mg by mouth at bedtime as needed (for edema).    Yes Historical Provider, MD  hydrOXYzine (ATARAX/VISTARIL) 25 MG tablet  01/03/13  Yes Historical Provider, MD  ibuprofen  (ADVIL,MOTRIN) 400 MG tablet Take 400 mg by mouth every 8 (eight) hours as needed for pain.  10/12/12  Yes Antony Madura, PA-C  levothyroxine (SYNTHROID, LEVOTHROID) 50 MCG tablet Take 50 mcg by mouth daily before breakfast.    Yes Historical Provider, MD  mometasone-formoterol (DULERA) 200-5 MCG/ACT AERO Inhale 1 puff into the lungs 2 (two) times daily.   Yes Historical Provider, MD  Multiple Vitamin (MULTIVITAMIN WITH MINERALS) TABS Take 1 tablet by mouth every morning.    Yes Historical Provider, MD  ondansetron (ZOFRAN) 4 MG tablet Take 1 tablet (4 mg total) by mouth every 6 (six) hours. 01/03/13  Yes Clemetine Marker, MD  OVER THE COUNTER MEDICATION Place 1 application onto the skin as needed. Essential oil therapy Rosemary, Eucaliptus, Stress Away   Yes Historical Provider, MD  PERCOCET 5-325 MG per tablet Take 1 tablet by mouth every 6 (six) hours as needed for pain. 12/02/12  Yes Lisette Paz, PA-C  promethazine (PHENERGAN) 25 MG tablet Take 1 tablet (25 mg total) by mouth every 6 (six) hours as needed for nausea. 12/02/12  Yes Lisette Paz, PA-C  traMADol (ULTRAM) 50 MG tablet Take 1 tablet (50 mg total) by mouth every 6 (six) hours as needed for pain. 10/19/12  Yes Roxy Horseman, PA-C  vitamin B-12 (CYANOCOBALAMIN) 1000 MCG tablet Take 1,000 mcg by mouth every morning.    Yes Historical Provider, MD  vitamin C (ASCORBIC ACID) 500 MG tablet Take 1,000 mg by mouth every morning.    Yes Historical Provider, MD   Past Medical History  Diagnosis Date  . Hypothyroidism   . Edema   .  Hyperlipidemia   . Pneumonia   . Vasovagal syncope   . Sleep apnea   . Fibromyalgia   . Scoliosis   . Hx of cardiovascular stress test     Lex MV 2/14: EF 81%, no ischemia   Past Surgical History  Procedure Laterality Date  . Appendectomy    . Nasal sinus surgery    . Mouth surgery     Family History  Problem Relation Age of Onset  . CAD Maternal Grandmother   . Cancer Mother     ovarian  . Emphysema Maternal  Uncle   . Heart disease Maternal Grandfather   . Heart disease Paternal Grandfather   . Heart disease Paternal Grandmother    History   Social History  . Marital Status: Single    Spouse Name: N/A    Number of Children: 0  . Years of Education: N/A   Occupational History  .  [email or web address]   Social History Main Topics  . Smoking status: Never Smoker   . Smokeless tobacco: Never Used  . Alcohol Use: No  . Drug Use: No  . Sexually Active: Not on file   Other Topics Concern  . Not on file   Social History Narrative  . No narrative on file   ROS-see HPI Constitutional:   No-   weight loss, night sweats, fevers, chills, +fatigue, lassitude. HEENT:   No-  headaches, difficulty swallowing, tooth/dental problems, sore throat,       No-  sneezing, itching, ear ache, nasal congestion, post nasal drip,  CV:  + chest pain, orthopnea, PND, swelling in lower extremities, anasarca,                                  dizziness, palpitations Resp: +  shortness of breath with exertion or at rest.              No-   productive cough,  No non-productive cough,  No- coughing up of blood.              No-   change in color of mucus.  No- wheezing.   Skin: No-   rash or lesions. GI:  No-   heartburn, indigestion, abdominal pain, nausea, vomiting, diarrhea,                 change in bowel habits, loss of appetite GU: No-   dysuria, change in color of urine, no urgency or frequency.  No- flank pain. MS:  No-   joint pain or swelling.  No- decreased range of motion.  No- back pain. Neuro-     nothing unusual Psych:  No- change in mood or affect. No depression + anxiety.  No memory loss.  OBJ- Physical Exam General- Alert, Oriented, Affect-anxious. Appears to be hyperventilating. Calmed w/ distraction during conversation. Skin- rash-none, lesions- none, excoriation- none Lymphadenopathy- none Head- atraumatic            Eyes- Gross vision intact, PERRLA,  conjunctivae and secretions clear            Ears- Hearing, canals-normal            Nose- Clear, no-Septal dev, mucus, polyps, erosion, perforation             Throat- Mallampati III , mucosa clear , drainage- none, tonsils- atrophic Neck- flexible , trachea midline, no stridor ,  thyroid nl, carotid no bruit Chest - symmetrical excursion , unlabored           Heart/CV- RRR , no murmur , no gallop  , no rub, nl s1 s2                           - JVD- none , edema- none, stasis changes- none, varices- none           Lung- clear to P&A, wheeze- none, cough- none , dullness-none, rub- none           Chest wall-  Abd- tender-no, distended-no, bowel sounds-present, HSM- no Br/ Gen/ Rectal- Not done, not indicated Extrem- cyanosis- none, clubbing, none, atrophy- none, strength- nl Neuro- grossly intact to observation

## 2013-01-17 ENCOUNTER — Emergency Department (HOSPITAL_COMMUNITY): Payer: BC Managed Care – PPO

## 2013-01-17 ENCOUNTER — Encounter: Payer: Self-pay | Admitting: Internal Medicine

## 2013-01-17 ENCOUNTER — Emergency Department (HOSPITAL_COMMUNITY)
Admission: EM | Admit: 2013-01-17 | Discharge: 2013-01-17 | Disposition: A | Discharge status: Home / Self Care | Payer: BC Managed Care – PPO | Attending: Emergency Medicine | Admitting: Emergency Medicine

## 2013-01-17 ENCOUNTER — Encounter (HOSPITAL_COMMUNITY): Payer: Self-pay | Admitting: Nurse Practitioner

## 2013-01-17 ENCOUNTER — Telehealth: Payer: Self-pay | Admitting: Internal Medicine

## 2013-01-17 DIAGNOSIS — G4730 Sleep apnea, unspecified: Secondary | ICD-10-CM | POA: Insufficient documentation

## 2013-01-17 DIAGNOSIS — G4733 Obstructive sleep apnea (adult) (pediatric): Secondary | ICD-10-CM | POA: Insufficient documentation

## 2013-01-17 DIAGNOSIS — R002 Palpitations: Secondary | ICD-10-CM | POA: Insufficient documentation

## 2013-01-17 DIAGNOSIS — R079 Chest pain, unspecified: Secondary | ICD-10-CM | POA: Insufficient documentation

## 2013-01-17 DIAGNOSIS — Z8701 Personal history of pneumonia (recurrent): Secondary | ICD-10-CM | POA: Insufficient documentation

## 2013-01-17 DIAGNOSIS — Z79899 Other long term (current) drug therapy: Secondary | ICD-10-CM | POA: Insufficient documentation

## 2013-01-17 DIAGNOSIS — G8929 Other chronic pain: Secondary | ICD-10-CM | POA: Insufficient documentation

## 2013-01-17 DIAGNOSIS — E039 Hypothyroidism, unspecified: Secondary | ICD-10-CM | POA: Insufficient documentation

## 2013-01-17 DIAGNOSIS — Z8739 Personal history of other diseases of the musculoskeletal system and connective tissue: Secondary | ICD-10-CM | POA: Insufficient documentation

## 2013-01-17 LAB — CBC WITH DIFFERENTIAL/PLATELET
Basophils Absolute: 0 10*3/uL (ref 0.0–0.1)
Basophils Relative: 1 % (ref 0–1)
Eosinophils Absolute: 0.3 10*3/uL (ref 0.0–0.7)
Eosinophils Relative: 4 % (ref 0–5)
HCT: 35.6 % — ABNORMAL LOW (ref 36.0–46.0)
MCH: 31.2 pg (ref 26.0–34.0)
MCHC: 34.3 g/dL (ref 30.0–36.0)
Monocytes Absolute: 0.3 10*3/uL (ref 0.1–1.0)
Monocytes Relative: 5 % (ref 3–12)
Neutro Abs: 3.8 10*3/uL (ref 1.7–7.7)
RDW: 13.5 % (ref 11.5–15.5)

## 2013-01-17 LAB — POCT I-STAT, CHEM 8
BUN: 10 mg/dL (ref 6–23)
Chloride: 106 mEq/L (ref 96–112)
Creatinine, Ser: 0.5 mg/dL (ref 0.50–1.10)
Glucose, Bld: 119 mg/dL — ABNORMAL HIGH (ref 70–99)
Hemoglobin: 11.9 g/dL — ABNORMAL LOW (ref 12.0–15.0)
Potassium: 3.7 mEq/L (ref 3.5–5.1)
Sodium: 141 mEq/L (ref 135–145)

## 2013-01-17 LAB — BLOOD GAS, ARTERIAL
Acid-base deficit: 1.8 mmol/L (ref 0.0–2.0)
Bicarbonate: 21.7 mEq/L (ref 20.0–24.0)
FIO2: 0.21 %
O2 Saturation: 96.8 %
Patient temperature: 98.6
TCO2: 19.5 mmol/L (ref 0–100)
pO2, Arterial: 86.4 mmHg (ref 80.0–100.0)

## 2013-01-17 LAB — POCT I-STAT TROPONIN I

## 2013-01-17 MED ORDER — ONDANSETRON 8 MG PO TBDP
8.0000 mg | ORAL_TABLET | Freq: Once | ORAL | Status: AC
  Administered 2013-01-17: 8 mg via ORAL
  Filled 2013-01-17: qty 1

## 2013-01-17 MED ORDER — IOHEXOL 350 MG/ML SOLN
100.0000 mL | Freq: Once | INTRAVENOUS | Status: AC | PRN
Start: 2013-01-17 — End: 2013-01-17
  Administered 2013-01-17: 100 mL via INTRAVENOUS

## 2013-01-17 MED ORDER — ALBUTEROL SULFATE HFA 108 (90 BASE) MCG/ACT IN AERS: 2.0000 | INHALATION_SPRAY | RESPIRATORY_TRACT | Status: DC | PRN

## 2013-01-17 MED ORDER — ALBUTEROL SULFATE (5 MG/ML) 0.5% IN NEBU
5.0000 mg | INHALATION_SOLUTION | Freq: Once | RESPIRATORY_TRACT | Status: AC
  Administered 2013-01-17: 5 mg via RESPIRATORY_TRACT
  Filled 2013-01-17: qty 1

## 2013-01-17 NOTE — Telephone Encounter (Signed)
Spoke with Fayrene Fearing as [facility]-- He states patient arrived at [facility] with cyanosis at her lips but not at her nailbeds Says patient was tachypneic with shallow respirations, a RR of 22 BP 148/98 and 99% on RA States patient took 2 puffs of her Pro Air and did iprove stats slightly but wheezing became more audible. Patient requesting to be seen by Dr. Maple Hudson today and refuses the ED-- After speaking Dr. Maple Hudson and informing him of patients current status ---Patient needs to go to ER now Spoke back with patient and Fayrene Fearing and informed them of this per Dr. Maple Hudson Patient verbalized understanding and states she will head to [facility] ED now Will forward to Dr. Maple Hudson so he is aware

## 2013-01-17 NOTE — Assessment & Plan Note (Addendum)
Mild OSA.  Plan- [facility] to autotitrate for pressure check. She might prefer AutoPap It is hard to avoid impression of significant anxiety component to her multiple somatic complaints.

## 2013-01-17 NOTE — ED Provider Notes (Signed)
History     CSN: [id number]  Arrival date & time 01/17/13  1137   First MD Initiated Contact with Patient 01/17/13 1348      Chief Complaint  Patient presents with  . Shortness of Breath  . Chest Pain  . Palpitations    (Consider location/radiation/quality/duration/timing/severity/associated sxs/prior treatment) HPI Complains of anterior chest pain and shortness of breath constant since January of 2014. Progressively worsening. Patient has been evaluated multiple times in the emergency department for the same complaint she's also been evaluated by cardiology and by pulmonary medicine for same complaint. Symptoms are worse with exertion improved with rest. No associated vomiting. She's been treated with albuterol inhaler with partial relief. Further history via telephone from Dr.C. Young, patient's symptoms may be secondary to GERD and chronic aspiration also one time felt to be secondary to pancreatitis as she's had an elevated lipase in the past. Past Medical History  Diagnosis Date  . Hypothyroidism   . Edema   . Hyperlipidemia   . Pneumonia   . Vasovagal syncope   . Sleep apnea   . Fibromyalgia   . Scoliosis   . Hx of cardiovascular stress test     Lex MV 2/14: EF 81%, no ischemia    Past Surgical History  Procedure Laterality Date  . Appendectomy    . Nasal sinus surgery    . Mouth surgery      Family History  Problem Relation Age of Onset  . CAD Maternal Grandmother   . Cancer Mother     ovarian  . Emphysema Maternal Uncle   . Heart disease Maternal Grandfather   . Heart disease Paternal Grandfather   . Heart disease Paternal Grandmother     History  Substance Use Topics  . Smoking status: Never Smoker   . Smokeless tobacco: Never Used  . Alcohol Use: No    OB History   Grav Para Term Preterm Abortions TAB SAB Ect Mult Living                  Review of Systems  Respiratory: Positive for shortness of breath.   Cardiovascular: Positive for chest  pain and leg swelling.       Bilateral leg edema for several months    Allergies  Codeine; Hepatitis b virus vaccine; Imitrex; and Pneumococcal vaccines  Home Medications   Current Outpatient Rx  Name  Route  Sig  Dispense  Refill  . albuterol (PROVENTIL HFA;VENTOLIN HFA) 108 (90 BASE) MCG/ACT inhaler   Inhalation   Inhale 2 puffs into the lungs every 6 (six) hours as needed for wheezing.         . calcium carbonate (OS-CAL - DOSED IN MG OF ELEMENTAL CALCIUM) 1250 MG tablet   Oral   Take 1 tablet by mouth daily.         . chlorthalidone (HYGROTON) 25 MG tablet   Oral   Take 25 mg by mouth at bedtime as needed (for edema).          Marland Kitchen ibuprofen (ADVIL,MOTRIN) 400 MG tablet   Oral   Take 400 mg by mouth every 8 (eight) hours as needed for pain.          Marland Kitchen levothyroxine (SYNTHROID, LEVOTHROID) 50 MCG tablet   Oral   Take 50 mcg by mouth daily before breakfast.          . mometasone-formoterol (DULERA) 200-5 MCG/ACT AERO   Inhalation   Inhale 1 puff into the lungs 2 (  two) times daily.         . Multiple Vitamin (MULTIVITAMIN WITH MINERALS) TABS   Oral   Take 1 tablet by mouth every morning.          Marland Kitchen OVER THE COUNTER MEDICATION   Transdermal   Place 1 application onto the skin as needed. Essential oil therapy Rosemary, Eucaliptus, Stress Away         . oxyCODONE-acetaminophen (PERCOCET/ROXICET) 5-325 MG per tablet   Oral   Take 1 tablet by mouth every 6 (six) hours as needed for pain.         . promethazine (PHENERGAN) 25 MG tablet   Oral   Take 1 tablet (25 mg total) by mouth every 6 (six) hours as needed for nausea.   10 tablet   0   . vitamin B-12 (CYANOCOBALAMIN) 1000 MCG tablet   Oral   Take 1,000 mcg by mouth every morning.          . vitamin C (ASCORBIC ACID) 500 MG tablet   Oral   Take 1,000 mg by mouth every morning.          . traMADol (ULTRAM) 50 MG tablet   Oral   Take 1 tablet (50 mg total) by mouth every 6 (six) hours as  needed for pain.   15 tablet   0     BP 100/69  Pulse 77  Temp(Src) 98 F (36.7 C) (Oral)  Resp 21  SpO2 100%  LMP 01/08/2013  Physical Exam  Nursing note and vitals reviewed. Constitutional: She appears well-developed and well-nourished.  Anxious appearing  HENT:  Head: Normocephalic and atraumatic.  Eyes: Conjunctivae are normal. Pupils are equal, round, and reactive to light.  Neck: Neck supple. No tracheal deviation present. No thyromegaly present.  Cardiovascular: Normal rate and regular rhythm.   No murmur heard. Pulmonary/Chest: Effort normal and breath sounds normal.  Abdominal: Soft. Bowel sounds are normal. She exhibits no distension. There is no tenderness.  Obese  Musculoskeletal: Normal range of motion. She exhibits tenderness. She exhibits no edema.  One plus pretibial pitting edema bilaterally  Neurological: She is alert. Coordination normal.  Skin: Skin is warm and dry. No rash noted.  Psychiatric: She has a normal mood and affect.    ED Course  Procedures (including critical care time)  Labs Reviewed  CBC WITH DIFFERENTIAL - Abnormal; Notable for the following:    HCT 35.6 (*)    All other components within normal limits  POCT I-STAT, CHEM 8 - Abnormal; Notable for the following:    Glucose, Bld 119 (*)    Hemoglobin 11.9 (*)    HCT 35.0 (*)    All other components within normal limits  PRO B NATRIURETIC PEPTIDE  D-DIMER, QUANTITATIVE  BLOOD GAS, ARTERIAL  LIPASE, BLOOD   Dg Chest 2 View  01/17/2013  *RADIOLOGY REPORT*  Clinical Data: Chest pain and shortness of breath.  CHEST - 2 VIEW  Comparison: Single view of the chest 01/03/2013 and CT chest 10/12/2012.  Findings: The lungs are clear.  Heart size is normal.  No pneumothorax or pleural effusion.  IMPRESSION: Negative chest.   Original Report Authenticated By: Holley Dexter, M.D.      Date: 01/17/2013  Rate: 70  Rhythm: normal sinus rhythm  QRS Axis: normal  Intervals: normal  ST/T  Wave abnormalities: normal  Conduction Disutrbances:none  Narrative Interpretation:   Old EKG Reviewed: Unchanged from 01/03/2013 interpreted by me Results for orders placed during the  hospital encounter of 01/17/13  CBC WITH DIFFERENTIAL      Result Value Range   WBC 6.0  4.0 - 10.5 K/uL   RBC 3.91  3.87 - 5.11 MIL/uL   Hemoglobin 12.2  12.0 - 15.0 g/dL   HCT 30.8 (*) 65.7 - 84.6 %   MCV 91.0  78.0 - 100.0 fL   MCH 31.2  26.0 - 34.0 pg   MCHC 34.3  30.0 - 36.0 g/dL   RDW 96.2  95.2 - 84.1 %   Platelets 214  150 - 400 K/uL   Neutrophils Relative % 63  43 - 77 %   Neutro Abs 3.8  1.7 - 7.7 K/uL   Lymphocytes Relative 27  12 - 46 %   Lymphs Abs 1.6  0.7 - 4.0 K/uL   Monocytes Relative 5  3 - 12 %   Monocytes Absolute 0.3  0.1 - 1.0 K/uL   Eosinophils Relative 4  0 - 5 %   Eosinophils Absolute 0.3  0.0 - 0.7 K/uL   Basophils Relative 1  0 - 1 %   Basophils Absolute 0.0  0.0 - 0.1 K/uL  PRO B NATRIURETIC PEPTIDE      Result Value Range   Pro B Natriuretic peptide (BNP) 42.2  0 - 125 pg/mL  BLOOD GAS, ARTERIAL      Result Value Range   FIO2 0.21     pH, Arterial 7.416  7.350 - 7.450   pCO2 arterial 34.4 (*) 35.0 - 45.0 mmHg   pO2, Arterial 86.4  80.0 - 100.0 mmHg   Bicarbonate 21.7  20.0 - 24.0 mEq/L   TCO2 19.5  0 - 100 mmol/L   Acid-base deficit 1.8  0.0 - 2.0 mmol/L   O2 Saturation 96.8     Patient temperature 98.6     Collection site RIGHT RADIAL     Drawn by [id number]     Sample type ARTERIAL DRAW     Allens test (pass/fail) PASS  PASS  LIPASE, BLOOD      Result Value Range   Lipase 56  11 - 59 U/L  POCT I-STAT, CHEM 8      Result Value Range   Sodium 141  135 - 145 mEq/L   Potassium 3.7  3.5 - 5.1 mEq/L   Chloride 106  96 - 112 mEq/L   BUN 10  6 - 23 mg/dL   Creatinine, Ser 0.27  0.50 - 1.10 mg/dL   Glucose, Bld 253 (*) 70 - 99 mg/dL   Calcium, Ion 6.64  4.03 - 1.23 mmol/L   TCO2 23  0 - 100 mmol/L   Hemoglobin 11.9 (*) 12.0 - 15.0 g/dL   HCT 47.4 (*) 25.9 -  46.0 %  POCT I-STAT TROPONIN I      Result Value Range   Troponin i, poc 0.00  0.00 - 0.08 ng/mL   Comment 3            Dg Chest 2 View  01/17/2013  *RADIOLOGY REPORT*  Clinical Data: Chest pain and shortness of breath.  CHEST - 2 VIEW  Comparison: Single view of the chest 01/03/2013 and CT chest 10/12/2012.  Findings: The lungs are clear.  Heart size is normal.  No pneumothorax or pleural effusion.  IMPRESSION: Negative chest.   Original Report Authenticated By: Holley Dexter, M.D.    Dg Chest Port 1 View  01/03/2013  *RADIOLOGY REPORT*  Clinical Data: Chest pain and shortness of breath  PORTABLE CHEST -  1 VIEW  Comparison: 11/17/2012 and prior chest radiographs  Findings: The cardiomediastinal silhouette is unremarkable. The lungs are clear. There is no evidence of focal airspace disease, pulmonary edema, suspicious pulmonary nodule/mass, pleural effusion, or pneumothorax. No acute bony abnormalities are identified. This is a mildly low volume film.  IMPRESSION: No evidence of acute cardiopulmonary disease.   Original Report Authenticated By: Harmon Pier, M.D.      No diagnosis found. 3:35 PM 3 improved after nebulized treatment with albuterol 3:40 PM spoke with Dr. Maple Hudson requests CT scan of chest, lipase, blood gas if ED workup negative she can be referred back to his office for pulmonary function test a.nd further outpatient workup MDM  Patient is signed out to Dr. Roselyn Bering at 4:35 PM. She appears comfortable. CT angioplasty pending. A CT angiogram nonacute she can be discharged for followup to Dr. Maple Hudson. Will prescribe albuterol with spacer to use 2 puffs every 4 hours as needed for shortness of breath Diagnosis #1 chronic chest pain #2 chronic dyspnea        Doug Sou, MD 01/17/13 1643

## 2013-01-17 NOTE — Assessment & Plan Note (Signed)
Cardiac notes w/ Dr Elvis Coil office reviewed, significant for negative findings. Cardiogenic pulmonary edema is unlikely.  Chest pain and patchy ground glass might be seen in context of reflux/ aspiration.

## 2013-01-17 NOTE — ED Notes (Signed)
Patient transported to X-ray 

## 2013-01-17 NOTE — Assessment & Plan Note (Signed)
She seemed pretty clearly to be hyperventilating when I first met her, calming quickly, with no wheeze.. CT in Feb, 2014 was neg for PE. Can't rule out reactive airways disease. Plan- full PFT and 6 minute walk test.

## 2013-01-17 NOTE — ED Notes (Signed)
Pt reports SOB and cp which is constant x 2 years.  Has been in the ED x10 since January for same.  Pt reports she was at [facility] today for a routine check up when her SOB and cp became severe.  Pt reports she has seen a cardiologist and has released her because all the tests done was normal.  Pt in NAD.  Pt is in NAD.  Pt is A&O x 4.  Pt reports SOB gets worse with exertion.

## 2013-01-24 ENCOUNTER — Other Ambulatory Visit (INDEPENDENT_AMBULATORY_CARE_PROVIDER_SITE_OTHER): Payer: BC Managed Care – PPO

## 2013-01-24 ENCOUNTER — Encounter: Payer: Self-pay | Admitting: Internal Medicine

## 2013-01-24 ENCOUNTER — Ambulatory Visit (INDEPENDENT_AMBULATORY_CARE_PROVIDER_SITE_OTHER): Payer: BC Managed Care – PPO | Admitting: Internal Medicine

## 2013-01-24 VITALS — BP 90/62 | HR 84 | Ht 62.0 in | Wt 192.6 lb

## 2013-01-24 DIAGNOSIS — G4733 Obstructive sleep apnea (adult) (pediatric): Secondary | ICD-10-CM

## 2013-01-24 DIAGNOSIS — R0609 Other forms of dyspnea: Secondary | ICD-10-CM

## 2013-01-24 DIAGNOSIS — R0600 Dyspnea, unspecified: Secondary | ICD-10-CM

## 2013-01-24 DIAGNOSIS — R0602 Shortness of breath: Secondary | ICD-10-CM

## 2013-01-24 DIAGNOSIS — R9389 Abnormal findings on diagnostic imaging of other specified body structures: Secondary | ICD-10-CM

## 2013-01-24 LAB — SEDIMENTATION RATE: Sed Rate: 15 mm/hr (ref 0–22)

## 2013-01-24 NOTE — Patient Instructions (Addendum)
Keep appointment here for PFT and 6 minute walk test  Order- Schedule [facility] PFT methacholine inhalation challenge test     Dx dyspnea  Order lab- Sed rate, ANA,      Dx dyspnea

## 2013-01-24 NOTE — Progress Notes (Signed)
01/05/13- 42 yoF never smoker transferring from Dr Sherene Sires. Self referred- Father my pt, is here. Former [facility] patient for pulmonary-Increased SOB, chest pain and tightness.  Hx Cardiac w/u with clear coronaries on cath around 2006, no ischemia on nuc study, Nl EF w/ Gr II diastolic dysfunction, no pulmonary hypertension.  Office spirometry 10/27/12- FVC2.32/ 71%, FEV1 2.02/ 73%, FEV1/FVC 0.87, FEF 25-75% 94% CT chest 2/5 /14: IMPRESSION:  Negative for significant acute pulmonary embolus by CTA.  Low volume exam with scattered diffuse bilateral patchy ground-  glass attenuation which could represent early edema/volume overload  or mild alveolitis.  Original Report Authenticated By: Judie Petit. Miles Costain, M.D .Main concern for this visit:  Obstructive Sleep apnea Daytime sleepiness, snoring. Wakes tired.  NPSG- 12/15/10- Dr [facility]- RDI 6.8/ hr. CPAP 10,, [facility], full face mask, humidifier Bedtime 10PM-MN, short latency, no WASO, up 5:00-5:30 AM. Describes easy DOE. Leaning on cart in store. Hx of pneumonia or bronchitis about 1x/ year. Pneumovax caused large local reaction. Denies hx of asthma. Some pollen rhinitis in Spring. Hx chest pain in January, 2014 at sternum, extending L arm, cleared by cardiology and dx'd w/ pancreatitis.  Says she is stressed by her job.  01/24/13- 80 yoF never smoker transfer from Dr Sherene Sires.  Dyspnea, OSA, abnormal CT  Self referred- Father my pt, is here FOLLOWS FOR: recent ED visit for Chest pain and  Pt states she continues to have unexplained SOB and states her pulse keeps going up(92 last night at dinner when she experienced the SOB). Today reports "better than usual". Episodic palpitation and substernal soreness. Went to [facility] to get CPAP adjusted. They noted her labored breathing and referred her here. Denies feeling anxious. Any sustained walking -labored breathing. CPAP 10, compliant,  Download suggests 15 cwp. Avg use 3hrs 53 minutes. ECHO  10/17/12- Gr 2 diastolic dysfunction/CHF, no PHTN Has appointment at [facility] for f/u previous question of pancreatitis, because she had an elevated lipase.  ABG 10/12/12 (? FiO2), pH 7.609, PCO2 20.3, PO2 122, HCO3 20.5- consistent w/ acute respiratory alkalosis. BMET 01/17/13- Anion Gap 12 CT chest 01/17/13- reviewed w/ her IMPRESSION:  No acute findings. No evidence of pulmonary embolus.  Original Report Authenticated By: Charlett Nose, M.D.  ROS-see HPI Constitutional:   No-   weight loss, night sweats, fevers, chills, +fatigue, lassitude. HEENT:   No-  headaches, difficulty swallowing, tooth/dental problems, sore throat,       No-  sneezing, itching, ear ache, nasal congestion, post nasal drip,  CV:  + chest pain, orthopnea, PND, swelling in lower extremities, anasarca,                                  dizziness, palpitations Resp: +  shortness of breath with exertion or at rest.              No-   productive cough,  No non-productive cough,  No- coughing up of blood.              No-   change in color of mucus.  No- wheezing.   Skin: No-   rash or lesions. GI:  No-   heartburn, indigestion, abdominal pain, nausea, vomiting, GU:  MS:  No-   joint pain or swelling.  Neuro-     nothing unusual Psych:  No- change in mood or affect. No depression + anxiety.  No memory loss.  OBJ-  Physical Exam General- Alert, Oriented, Affect-anxious. Appears to be hyperventilating. Calmed w/ distraction during conversation. Skin- rash-none, lesions- none, excoriation- none Lymphadenopathy- none Head- atraumatic            Eyes- Gross vision intact, PERRLA, conjunctivae and secretions clear            Ears- Hearing, canals-normal            Nose- Clear, no-Septal dev, mucus, polyps, erosion, perforation             Throat- Mallampati III , mucosa clear , drainage- none, tonsils- atrophic Neck- flexible , trachea midline, no stridor , thyroid nl, carotid no bruit Chest - symmetrical excursion ,  unlabored           Heart/CV- RRR , no murmur , no gallop  , no rub, nl s1 s2                           - JVD- none , edema- none, stasis changes- none, varices- none           Lung- clear to P&A, wheeze- none, cough- none , dullness-none, rub- none           Chest wall-  Abd-  Br/ Gen/ Rectal- Not done, not indicated Extrem- cyanosis- none, clubbing, none, atrophy- none, strength- nl Neuro- grossly intact to observation

## 2013-01-25 LAB — ANA: Anti Nuclear Antibody(ANA): NEGATIVE

## 2013-01-25 NOTE — Progress Notes (Signed)
Quick Note:  Pt aware of results. ______ 

## 2013-01-27 ENCOUNTER — Ambulatory Visit (HOSPITAL_COMMUNITY)
Admission: RE | Admit: 2013-01-27 | Discharge: 2013-01-27 | Disposition: A | Discharge status: Home / Self Care | Payer: BC Managed Care – PPO | Source: Ambulatory Visit | Attending: Internal Medicine | Admitting: Internal Medicine

## 2013-01-27 DIAGNOSIS — R0600 Dyspnea, unspecified: Secondary | ICD-10-CM

## 2013-01-27 DIAGNOSIS — R0989 Other specified symptoms and signs involving the circulatory and respiratory systems: Secondary | ICD-10-CM | POA: Insufficient documentation

## 2013-01-27 DIAGNOSIS — R0609 Other forms of dyspnea: Secondary | ICD-10-CM | POA: Insufficient documentation

## 2013-01-27 LAB — PULMONARY FUNCTION TEST

## 2013-01-27 MED ORDER — METHACHOLINE 1 MG/ML NEB SOLN
2.0000 mL | Freq: Once | RESPIRATORY_TRACT | Status: AC
  Administered 2013-01-27: 2 mg via RESPIRATORY_TRACT

## 2013-01-27 MED ORDER — METHACHOLINE 4 MG/ML NEB SOLN: 2.0000 mL | Freq: Once | RESPIRATORY_TRACT | Status: DC

## 2013-01-27 MED ORDER — METHACHOLINE 16 MG/ML NEB SOLN: 2.0000 mL | Freq: Once | RESPIRATORY_TRACT | Status: DC

## 2013-01-27 MED ORDER — METHACHOLINE 0.25 MG/ML NEB SOLN
2.0000 mL | Freq: Once | RESPIRATORY_TRACT | Status: AC
  Administered 2013-01-27: 0.5 mg via RESPIRATORY_TRACT

## 2013-01-27 MED ORDER — ALBUTEROL SULFATE (5 MG/ML) 0.5% IN NEBU
2.5000 mg | INHALATION_SOLUTION | Freq: Once | RESPIRATORY_TRACT | Status: AC
  Administered 2013-01-27: 2.5 mg via RESPIRATORY_TRACT

## 2013-01-27 MED ORDER — SODIUM CHLORIDE 0.9 % IN NEBU
3.0000 mL | INHALATION_SOLUTION | Freq: Once | RESPIRATORY_TRACT | Status: AC
  Administered 2013-01-27: 3 mL via RESPIRATORY_TRACT

## 2013-01-27 MED ORDER — METHACHOLINE 0.0625 MG/ML NEB SOLN
2.0000 mL | Freq: Once | RESPIRATORY_TRACT | Status: AC
  Administered 2013-01-27: 0.125 mg via RESPIRATORY_TRACT

## 2013-02-01 ENCOUNTER — Telehealth: Payer: Self-pay | Admitting: Internal Medicine

## 2013-02-01 NOTE — Telephone Encounter (Signed)
The tests indicate she is over-breathing (hyperventilating). I will go over her tests with her next ov.

## 2013-02-01 NOTE — Telephone Encounter (Signed)
I called and explained to the pt that CDY has still not reviewed the msg yet, and that is why we have not called her back yet She verbalized understanding I advised that if she does not feel that she can wait and her breathing is worse, needs to go to ED She states will wait a little while longer and then seek emergent care if needed Will forward this back to CDY, along with the MCT results Please advise thanks!

## 2013-02-01 NOTE — Telephone Encounter (Signed)
Spoke with pt and notified of results per CDY She wants to be seen sooner for SOB Per Katie ok to add on for 02/03/13 at 2:30  Pt to seek emergent care sooner if needed

## 2013-02-01 NOTE — Telephone Encounter (Signed)
LMOM TCB x1. Next ov w/ CY is 6.17.14

## 2013-02-01 NOTE — Telephone Encounter (Signed)
Calling again in ref to previous msg.Destiny Sharp ° °

## 2013-02-01 NOTE — Telephone Encounter (Signed)
Last OV 01-24-13. Next OV on 02/21/13. I spoke with the pt and she is c/o increased SOB mainly with activity. She states that she feels this is worse then at last OV and inhalers are not helping. Pt states she is taking her dulera daily and she is using her albuterol inhaler sometimes 5 times a day. I asked the pt when she gets SOB with activity does stopping and resting help. Pt states it will take several hours for her SOB to improve after activity and resting. Pt also states that she is very fatigued when this happens as well. Pt states when she went to the grocery store yesterday she had to ride in the motorized wheelchair and her father helped her with groceries. Pt sounded winded on the phone with me and stated she had only walked 20 feet to answer the phone and this caused her to be this SOB. Pt states she does not want to go to the ER.  Pt had a Methacholine Challenge test on 01-27-13. I have requested this to be faxed to triage fax. I will forward message to CY while we wait for results. Please advise. Carron Curie, CMA Allergies  Allergen Reactions  . Codeine Nausea And Vomiting  . Hepatitis B Virus Vaccine Itching  . Imitrex (Sumatriptan) Other (See Comments)    vomitting  . Pneumococcal Vaccines Itching

## 2013-02-02 ENCOUNTER — Telehealth: Payer: Self-pay | Admitting: Internal Medicine

## 2013-02-02 NOTE — Telephone Encounter (Signed)
noted 

## 2013-02-03 ENCOUNTER — Ambulatory Visit (INDEPENDENT_AMBULATORY_CARE_PROVIDER_SITE_OTHER): Payer: BC Managed Care – PPO | Admitting: Internal Medicine

## 2013-02-03 ENCOUNTER — Encounter: Payer: Self-pay | Admitting: Internal Medicine

## 2013-02-03 VITALS — BP 110/72 | HR 86 | Ht 62.5 in | Wt 192.4 lb

## 2013-02-03 DIAGNOSIS — R0602 Shortness of breath: Secondary | ICD-10-CM

## 2013-02-03 DIAGNOSIS — R0609 Other forms of dyspnea: Secondary | ICD-10-CM

## 2013-02-03 DIAGNOSIS — R0600 Dyspnea, unspecified: Secondary | ICD-10-CM

## 2013-02-03 MED ORDER — COMPRESSOR/NEBULIZER MISC: Status: AC

## 2013-02-03 MED ORDER — IPRATROPIUM-ALBUTEROL 0.5-2.5 (3) MG/3ML IN SOLN: 3.0000 mL | Freq: Four times a day (QID) | RESPIRATORY_TRACT | Status: DC | PRN

## 2013-02-03 MED ORDER — ALBUTEROL SULFATE HFA 108 (90 BASE) MCG/ACT IN AERS: 2.0000 | INHALATION_SPRAY | RESPIRATORY_TRACT | Status: DC | PRN

## 2013-02-03 NOTE — Assessment & Plan Note (Signed)
Plan- PFT, mecholyl

## 2013-02-03 NOTE — Assessment & Plan Note (Signed)
This appears to be hyperventilation syndrome. Will try to clarify, excluding any potential for mild asthma with  Mecholyl study, if she can perform it. Exclude a vasculitis/ inflammatory problem with sed rate, ANA. Hyperventilation would probably be psychogenic. Would she need eval for CNS organic disorder?

## 2013-02-03 NOTE — Assessment & Plan Note (Signed)
Plan- try increase CPAP to 12. Encourage more use each night.

## 2013-02-03 NOTE — Progress Notes (Signed)
PFT done today. 

## 2013-02-03 NOTE — Patient Instructions (Addendum)
Script for nebulizer machine and medication. You can use this up to 4 times daily if needed, and skip it whenever you don't need it.  Script for [employer] refill  Try bag breathing during your episodes, while you dry to calm down.

## 2013-02-03 NOTE — Progress Notes (Signed)
01/05/13- 42 yoF never smoker transferring from Dr Sherene Sires. Self referred- Father my pt, is here. Former [facility] patient for pulmonary-Increased SOB, chest pain and tightness.  Hx Cardiac w/u with clear coronaries on cath around 2006, no ischemia on nuc study, Nl EF w/ Gr II diastolic dysfunction, no pulmonary hypertension.  Office spirometry 10/27/12- FVC2.32/ 71%, FEV1 2.02/ 73%, FEV1/FVC 0.87, FEF 25-75% 94% CT chest 2/5 /14: IMPRESSION:  Negative for significant acute pulmonary embolus by CTA.  Low volume exam with scattered diffuse bilateral patchy ground-  glass attenuation which could represent early edema/volume overload  or mild alveolitis.  Original Report Authenticated By: Judie Petit. Miles Costain, M.D .Main concern for this visit:  Obstructive Sleep apnea Daytime sleepiness, snoring. Wakes tired.  NPSG- 12/15/10- Dr [facility]- RDI 6.8/ hr. CPAP 10,, [facility], full face mask, humidifier Bedtime 10PM-MN, short latency, no WASO, up 5:00-5:30 AM. Describes easy DOE. Leaning on cart in store. Hx of pneumonia or bronchitis about 1x/ year. Pneumovax caused large local reaction. Denies hx of asthma. Some pollen rhinitis in Spring. Hx chest pain in January, 2014 at sternum, extending L arm, cleared by cardiology and dx'd w/ pancreatitis.  Says she is stressed by her job.  01/24/13- 63 yoF never smoker transfer from Dr Sherene Sires.  Dyspnea, OSA, abnormal CT  Self referred-  FOLLOWS FOR: recent ED visit for Chest pain and  Pt states she continues to have unexplained SOB and states her pulse keeps going up(92 last night at dinner when she experienced the SOB). Today reports "better than usual". Episodic palpitation and substernal soreness. Went to [facility] to get CPAP adjusted. They noted her labored breathing and referred her here. Denies feeling anxious. Any sustained walking -labored breathing. CPAP 10, compliant,  Download suggests 15 cwp. Avg use 3hrs 53 minutes. ECHO 10/17/12- Gr 2 diastolic  dysfunction/CHF, no PHTN Has appointment at [facility] for f/u previous question of pancreatitis, because she had an elevated lipase.  ABG 10/12/12 (? FiO2), pH 7.609, PCO2 20.3, PO2 122, HCO3 20.5- consistent w/ acute respiratory alkalosis. BMET 01/17/13- Anion Gap 12 CT chest 01/17/13- reviewed w/ her IMPRESSION:  No acute findings. No evidence of pulmonary embolus.  Original Report Authenticated By: Charlett Nose, M.D.  02/03/13- 84 yoF never smoker transfer from Dr Sherene Sires.  Dyspnea, OSA, abnormal CT  Self referred-  SOB. Pt had PFT and SMW. Pt reports breathing is getting worse. She has had several "coughing fits" in the past couple days, wheezing and chest tx. Father here. Denies routine headache, double vision, numbness. Dr. Mamie Nick manages her thyroid. Had neurologic evaluation by Dr Vickey Huger. Migraine evaluation by Dr Clarisse Gouge. GI evaluation at [facility].  PFT 02/03/13- severe restriction of total lung capacity, normal spirometry flows for volume with insignificant response to bronchodilator, diffusion moderately reduced. TLC 49%, DLCO 62%. FVC 2.19/62%, FEV1 1.95/68%, FEV1/FVC 89/108%, FEF 25-75% 88/104%. 6 minute walk test 02/03/2013-98%, 99%, 99%, 303 m. 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 HEENT:   No-  headaches, difficulty swallowing, tooth/dental problems, sore throat,       No-  sneezing, itching, ear ache, nasal congestion, post nasal drip,  CV:  + chest pain, orthopnea, PND, swelling in lower extremities, anasarca,  dizziness, palpitations Resp: +  shortness of breath with exertion or at rest.              No-   productive cough,  No non-productive cough,  No- coughing up of blood.              No-   change in color of mucus.  No- wheezing.   Skin: No-   rash or lesions. GI:  No-   heartburn,  indigestion, abdominal pain, nausea, vomiting, GU:  MS:  No-   joint pain or swelling.  Neuro-     nothing unusual Psych:  No- change in mood or affect. No depression + anxiety.  No memory loss.  OBJ- Physical Exam General- Alert, Oriented, Affect-anxious. Not hyperventilating at this visit. during conversation. Skin- some rash around ankles, early stasis dermatitis or leukocytoclastic angiitis Lymphadenopathy- none Head- atraumatic            Eyes- Gross vision intact, PERRLA, conjunctivae and secretions clear            Ears- Hearing, canals-normal            Nose- Clear, no-Septal dev, mucus, polyps, erosion, perforation             Throat- Mallampati III , mucosa clear , drainage- none, tonsils- atrophic Neck- flexible , trachea midline, no stridor , thyroid nl, carotid no bruit Chest - symmetrical excursion , unlabored           Heart/CV- RRR , no murmur , no gallop  , no rub, nl s1 s2                           - JVD- none , edema- none, stasis changes- none, varices- none           Lung- clear to P&A, wheeze- none, cough- none , dullness-none, rub- none           Chest wall-  Abd-  Br/ Gen/ Rectal- Not done, not indicated Extrem- cyanosis- none, clubbing, none, atrophy- none, strength- nl Neuro- grossly intact to observation

## 2013-02-14 NOTE — Progress Notes (Signed)
Documentation for 6 minute walk test 

## 2013-02-14 NOTE — Assessment & Plan Note (Addendum)
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 Spirometry loops are more consistent than we usually see in restriction is due to submaximal effort.most recent CT did not indicate significant parenchymal disease. Methacholine study suggests some component of hyperreactive airways disease/mild asthma. This may respond symptomatically to bronchodilators. Plan- we are going to emphasize treating the small bronchospastic component with a nebulizer machine. Component of hyperventilation be managed with bag breathing which we instructed. Consider fluoroscopy of diaphragms or ventilation perfusion scan as additional ways of assessing lung volume.

## 2013-02-15 ENCOUNTER — Telehealth: Payer: Self-pay | Admitting: Internal Medicine

## 2013-02-15 MED ORDER — CILIDINIUM-CHLORDIAZEPOXIDE 2.5-5 MG PO CAPS: ORAL_CAPSULE | ORAL | Status: DC

## 2013-02-15 NOTE — Telephone Encounter (Signed)
I would like to try an antispasmodic Librax       1-2 caps, 2 or 3 times daily as needed     # 30, ref x 3

## 2013-02-15 NOTE — Telephone Encounter (Signed)
Called spoke with patient who repports she had a "bad episode" last night with SOB, tightness in chest, some wheezing and fatigue.  Pt stated that she does feel better today but the pain was nearly enough to send her to the ER.  Denies f/c/s, cough, congestion.  Has not used any OTC meds but has been taking her Ventolin HFA, duoneb and will occasionally take a percocet for the pain.  Pt unable to come to the office this week for ov d/t work Is requesting recs over the phone Last ov 5.30.14 Dr Maple Hudson please advise, thank you. [facility] Allergies  Allergen Reactions  . Codeine Nausea And Vomiting  . Hepatitis B Virus Vaccine Itching  . Imitrex (Sumatriptan) Other (See Comments)    vomitting  . Pneumococcal Vaccines Itching

## 2013-02-15 NOTE — Telephone Encounter (Signed)
Called spoke with patient and advised of CY's recs as stated below  Pt verbalized her understanding and denied any questions regarding the rx but did voice the question as to whether or not she should go back to the ER if the pain does return.  Advised pt that we would prefer her to NOT go to the ER if it is an issue that we can possibly help her with > advised to call here during regular business hours and advised pt that we do have an on-call service after-hours and to call if needed.  Advised pt though that if her symptoms are severe and she cannot wait for recs from the office/on-call to please seek emergency attention.  Pt grateful and verbalized her understanding.    Librax sent to verified pharmacy Pt will call back with update on medication

## 2013-02-21 ENCOUNTER — Ambulatory Visit (INDEPENDENT_AMBULATORY_CARE_PROVIDER_SITE_OTHER): Payer: BC Managed Care – PPO | Admitting: Internal Medicine

## 2013-02-21 ENCOUNTER — Encounter: Payer: Self-pay | Admitting: Internal Medicine

## 2013-02-21 VITALS — BP 120/76 | HR 90 | Ht 62.5 in | Wt 190.6 lb

## 2013-02-21 DIAGNOSIS — R0602 Shortness of breath: Secondary | ICD-10-CM

## 2013-02-21 DIAGNOSIS — G4733 Obstructive sleep apnea (adult) (pediatric): Secondary | ICD-10-CM

## 2013-02-21 NOTE — Progress Notes (Signed)
01/05/13- 42 yoF never smoker transferring from Dr Sherene Sires. Self referred- Father my pt, is here. Former [facility] patient for pulmonary-Increased SOB, chest pain and tightness.  Hx Cardiac w/u with clear coronaries on cath around 2006, no ischemia on nuc study, Nl EF w/ Gr II diastolic dysfunction, no pulmonary hypertension.  Office spirometry 10/27/12- FVC2.32/ 71%, FEV1 2.02/ 73%, FEV1/FVC 0.87, FEF 25-75% 94% CT chest 2/5 /14: IMPRESSION:  Negative for significant acute pulmonary embolus by CTA.  Low volume exam with scattered diffuse bilateral patchy ground-  glass attenuation which could represent early edema/volume overload  or mild alveolitis.  Original Report Authenticated By: Judie Petit. Miles Costain, M.D .Main concern for this visit:  Obstructive Sleep apnea Daytime sleepiness, snoring. Wakes tired.  N[facility]- 12/15/10- Dr [facility]enc[facility]- RDI 6.8/ hr. CPAP 10,, [facility], full face mask, humidifier Bedtime 10PM-MN, short latency, no WASO, up 5:00-5:30 AM. Describes easy DOE. Leaning on cart in store. Hx of pneumonia or bronchitis about 1x/ year. Pneumovax caused large local reaction. Denies hx of asthma. Some pollen rhinitis in Spring. Hx chest pain in January, 2014 at sternum, extending L arm, cleared by cardiology and dx'd w/ pancreatitis.  Says she is stressed by her job.  01/24/13- 82 yoF never smoker transfer from Dr Sherene Sires.  Dyspnea, OSA, abnormal CT  Self referred-  FOLLOWS FOR: recent ED visit for Chest pain and  Pt states she continues to have unexplained SOB and states her pulse keeps going up(92 last night at dinner when she experienced the SOB). Today reports "better than usual". Episodic palpitation and substernal soreness. Went to [facility] to get CPAP adjusted. They noted her labored breathing and referred her here. Denies feeling anxious. Any sustained walking -labored breathing. CPAP 10, compliant,  Download suggests 15 cwp. Avg use 3hrs 53 minutes. ECHO 10/17/12- Gr 2 diastolic  dysfunction/CHF, no PHTN Has appointment at [facility] for f/u previous question of pancreatitis, because she had an elevated lipase.  ABG 10/12/12 (? FiO2), pH 7.609, PCO2 20.3, PO2 122, HCO3 20.5- consistent w/ acute respiratory alkalosis. BMET 01/17/13- Anion Gap 12 CT chest 01/17/13- reviewed w/ her IMPRESSION:  No acute findings. No evidence of pulmonary embolus.  Original Report Authenticated By: Charlett Nose, M.D.  02/03/13- 44 yoF never smoker transfer from Dr Sherene Sires.  Dyspnea, OSA, abnormal CT  Self referred-  SOB. Pt had PFT and SMW. Pt reports breathing is getting worse. She has had several "coughing fits" in the past couple days, wheezing and chest tx. Father here. Denies routine headache, double vision, numbness. Dr. Yehuda Budd manages her thyroid. Had neurologic evaluation by Dr Vickey Huger. Migraine evaluation by Dr Clarisse Gouge. GI evaluation at [facility].  PFT 02/03/13- severe restriction of total lung capacity, normal spirometry flows for volume with insignificant response to bronchodilator, diffusion moderately reduced. TLC 49%, DLCO 62%. FVC 2.19/62%, FEV1 1.95/68%, FEV1/FVC 89/108%, FEF 25-75% 88/104%. 6 minute walk test 02/03/2013-98%, 99%, 99%, 303 m. Methacholine inhalation challenge test 01/27/2013 positive for hyperreactive airways at stage I.0  02/20/13- 42 yoF never smoker followed for asthma, episodic dyspnea, restrictive PFT,  hyperventilation, OSA, anxiety  Hx abnormal CT School year is over so she is less stressed. Says breathing has been better. Has not needed Librax. Easy exertional dyspnea makes her afraid to try activities. Likes CPAP 10/ [facility].  ROS-see HPI Constitutional:   No-   weight loss, night sweats, fevers, chills, +fatigue, lassitude. HEENT:   No-  headaches, difficulty swallowing, tooth/dental problems, sore throat,       No-  sneezing, itching, ear ache, nasal congestion, post nasal drip,  CV:  + chest pain, orthopnea, PND, swelling in lower extremities, anasarca,                                                dizziness, palpitations Resp: +  shortness of breath with exertion or at rest.              No-   productive cough,  No non-productive cough,  No- coughing up of blood.              No-   change in color of mucus.  No- wheezing.   Skin: No-   rash or lesions. GI:  No-   heartburn, indigestion, abdominal pain, nausea, vomiting, GU:  MS:  No-   joint pain or swelling.  Neuro-     nothing unusual Psych:  No- change in mood or affect. No depression + anxiety.  No memory loss.  OBJ- Physical Exam  She still comes across as tense, anxious, restrained- better today. General- Alert, Oriented, Affect-anxious. Not hyperventilating at this visit. during conversation. Skin- some rash around ankles, early stasis dermatitis or leukocytoclastic angiitis Lymphadenopathy- none Head- atraumatic            Eyes- Gross vision intact, PERRLA, conjunctivae and secretions clear            Ears- Hearing, canals-normal            Nose- Clear, no-Septal dev, mucus, polyps, erosion, perforation             Throat- Mallampati III , mucosa clear , drainage- none, tonsils- atrophic Neck- flexible , trachea midline, no stridor , thyroid nl, carotid no bruit Chest - symmetrical excursion , unlabored           Heart/CV- RRR , no murmur , no gallop  , no rub, nl s1 s2                           - JVD- none , edema- none, stasis changes- none, varices- none           Lung- clear to P&A, wheeze- none, cough- none , dullness-none, rub- none. More able to take a deep breath today, more relaxed           Chest wall-  Abd-  Br/ Gen/ Rectal- Not done, not indicated Extrem- cyanosis- none, clubbing, none, atrophy- none, strength- nl Neuro- grossly intact to observation

## 2013-02-21 NOTE — Patient Instructions (Addendum)
We can continue present meds  Feel free to do what ever you want to do, now that school is over. Lots of walking is great for building your stamina back.

## 2013-03-08 NOTE — Assessment & Plan Note (Addendum)
We're watching this as a combination of asthma and anxiety. When she is tense, she takes only a shallow breath, explaining the low FVC on her PFTs

## 2013-03-08 NOTE — Assessment & Plan Note (Signed)
Continued good compliance and control with CPAP. It seems comfortable to her.

## 2013-05-17 ENCOUNTER — Encounter: Payer: Self-pay | Admitting: Cardiology

## 2013-05-24 ENCOUNTER — Encounter: Payer: Self-pay | Admitting: Internal Medicine

## 2013-05-24 ENCOUNTER — Ambulatory Visit (INDEPENDENT_AMBULATORY_CARE_PROVIDER_SITE_OTHER): Payer: BC Managed Care – PPO | Admitting: Internal Medicine

## 2013-05-24 VITALS — BP 120/78 | HR 84 | Ht 62.5 in | Wt 192.2 lb

## 2013-05-24 DIAGNOSIS — G4733 Obstructive sleep apnea (adult) (pediatric): Secondary | ICD-10-CM

## 2013-05-24 DIAGNOSIS — R0602 Shortness of breath: Secondary | ICD-10-CM

## 2013-05-24 DIAGNOSIS — Z23 Encounter for immunization: Secondary | ICD-10-CM

## 2013-05-24 MED ORDER — MONTELUKAST SODIUM 10 MG PO TABS: 10.0000 mg | ORAL_TABLET | Freq: Every day | ORAL | Status: DC

## 2013-05-24 MED ORDER — MOMETASONE FURO-FORMOTEROL FUM 200-5 MCG/ACT IN AERO: INHALATION_SPRAY | RESPIRATORY_TRACT | Status: DC

## 2013-05-24 NOTE — Patient Instructions (Addendum)
Depo 80  Sample and script Dulera 200      2 puffs then rinse mouth, twice daily  Script for Singulair as a maintenance airway ainti-inflammatory  Don't forget to get that flu shot at work

## 2013-05-24 NOTE — Progress Notes (Signed)
01/05/13- 71 yoF never smoker transferring from Dr Melvyn Novas. Self referred- Father my pt, is here. Former [facility] patient for pulmonary-Increased SOB, chest pain and tightness.  Hx Cardiac w/u with clear coronaries on cath around 2006, no ischemia on nuc study, Nl EF w/ Gr II diastolic dysfunction, no pulmonary hypertension.  Office spirometry 10/27/12- FVC2.32/ 71%, FEV1 2.02/ 73%, FEV1/FVC 0.87, FEF 25-75% 94% CT chest 2/5 /14: IMPRESSION:  Negative for significant acute pulmonary embolus by CTA.  Low volume exam with scattered diffuse bilateral patchy ground-  glass attenuation which could represent early edema/volume overload  or mild alveolitis.  Original Report Authenticated By: Jerilynn Mages. Annamaria Boots, M.D .Main concern for this visit:  Obstructive Sleep apnea Daytime sleepiness, snoring. Wakes tired.  NPSG- 12/15/10- Dr Jaynie [facility]- RDI 6.8/ hr. CPAP 10,, [facility], full face mask, humidifier Bedtime 10PM-MN, short latency, no WASO, up 5:00-5:30 AM. Describes easy DOE. Leaning on cart in store. Hx of pneumonia or bronchitis about 1x/ year. Pneumovax caused large local reaction. Denies hx of asthma. Some pollen rhinitis in Spring. Hx chest pain in January, 2014 at sternum, extending L arm, cleared by cardiology and dx'd w/ pancreatitis.  Says she is stressed by her job.  01/24/13- 69 yoF never smoker transfer from Dr Melvyn Novas.  Dyspnea, OSA, abnormal CT  Self referred-  FOLLOWS FOR: recent ED visit for Chest pain and  Pt states she continues to have unexplained SOB and states her pulse keeps going up(92 last night at dinner when she experienced the SOB). Today reports "better than usual". Episodic palpitation and substernal soreness. Went to [facility] to get CPAP adjusted. They noted her labored breathing and referred her here. Denies feeling anxious. Any sustained walking -labored breathing. CPAP 10, compliant,  Download suggests 15 cwp. Avg use 3hrs 53 minutes. ECHO 2/44/01- Gr 2 diastolic  dysfunction/CHF, no PHTN Has appointment at [facility] for f/u previous question of pancreatitis, because she had an elevated lipase.  ABG 10/12/12 (? FiO2), pH 7.609, PCO2 20.3, PO2 122, HCO3 20.5- consistent w/ acute respiratory alkalosis. BMET 01/17/13- Anion Gap 12 CT chest 01/17/13- reviewed w/ her IMPRESSION:  No acute findings. No evidence of pulmonary embolus.  Original Report Authenticated By: Rolm Baptise, M.D.  02/03/13- 24 yoF never smoker transfer from Dr Melvyn Novas.  Dyspnea, OSA, abnormal CT  Self referred-  SOB. Pt had PFT and SMW. Pt reports breathing is getting worse. She has had several "coughing fits" in the past couple days, wheezing and chest tx. Father here. Denies routine headache, double vision, numbness. Dr. Greta Doom manages her thyroid. Had neurologic evaluation by Dr Brett Fairy. Migraine evaluation by Dr Catalina Gravel. GI evaluation at [facility].  PFT 02/03/13- severe restriction of total lung capacity, normal spirometry flows for volume with insignificant response to bronchodilator, diffusion moderately reduced. TLC 49%, DLCO 62%. FVC 2.19/62%, FEV1 1.95/68%, FEV1/FVC 89/108%, FEF 25-75% 88/104%. 6 minute walk test 02/03/2013-98%, 99%, 99%, 303 m. Methacholine inhalation challenge test 01/27/2013 positive for hyperreactive airways at stage I.0  02/20/13- 42 yoF never smoker followed for asthma, episodic dyspnea, restrictive PFT,  hyperventilation, OSA, anxiety  Hx abnormal CT School year is over so she is less stressed. Says breathing has been better. Has not needed Librax. Easy exertional dyspnea makes her afraid to try activities. Likes CPAP 10/ [facility].  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  Out of [facility] sample. Shows pictures she says demonstrate mold in her bus, which has been sent for cleaning.  CPAP 10/ [facility]- thinks this is doing well, used every night.  ROS-see HPI Constitutional:   No-   weight loss, night sweats, fevers, chills, +fatigue, lassitude. HEENT:   No-  headaches, difficulty swallowing, tooth/dental problems, sore throat,       + sneezing, itching, no-ear ache, +nasal congestion, +post nasal drip,  CV:  + chest pain/ pressure, no-orthopnea, PND, swelling in lower extremities, anasarca,                                               dizziness, palpitations Resp: +  shortness of breath with exertion or at rest.              No-   productive cough,  No non-productive cough,  No- coughing up of blood.              No-   change in color of mucus.  No- wheezing.   Skin: No-   rash or lesions. GI:  No-   heartburn, indigestion, abdominal pain, nausea, vomiting, GU:  MS:  No-   joint pain or swelling.  Neuro-     nothing unusual Psych:  No- change in mood or affect. No depression + anxiety.  No memory loss.  OBJ- Physical Exam  She still comes across as tense, anxious, restrained- better today. General- Alert, Oriented, Affect- +anxious. +shallow/ hyperventilating at this visit during conversation. Skin- some rash around ankles, early stasis dermatitis or leukocytoclastic angiitis Lymphadenopathy- none Head- atraumatic            Eyes- Gross vision intact, PERRLA, conjunctivae and secretions clear            Ears- Hearing, canals-normal            Nose- Clear, no-Septal dev, mucus, polyps, erosion, perforation             Throat- Mallampati III , mucosa clear , drainage- none, tonsils- atrophic Neck- flexible , trachea midline, no stridor , thyroid nl, carotid no bruit Chest - symmetrical excursion , unlabored           Heart/CV- RRR , no murmur , no gallop  , no rub, nl s1 s2                           - JVD- none , edema- none, stasis  changes- none, varices- none           Lung- clear to P&A, wheeze- none, cough- none , dullness-none, rub- none.           Chest wall-  Abd-  Br/ Gen/ Rectal- Not done, not indicated Extrem- cyanosis- none, clubbing, none, atrophy- none, strength- nl Neuro- grossly intact to observation

## 2013-05-25 ENCOUNTER — Telehealth: Payer: Self-pay | Admitting: Internal Medicine

## 2013-05-25 MED ORDER — MONTELUKAST SODIUM 10 MG PO TABS: 10.0000 mg | ORAL_TABLET | Freq: Every day | ORAL | Status: DC

## 2013-05-25 NOTE — Telephone Encounter (Signed)
LMOM  [facility] for [facility]ulair.

## 2013-06-02 NOTE — Assessment & Plan Note (Signed)
Continues with good compliance and control. No need to change in

## 2013-06-02 NOTE — Assessment & Plan Note (Signed)
What I see now looks like shallow hyperventilating. 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 Plan-DepoMedrol, sample and prescription to restart Dulera 200

## 2013-07-11 ENCOUNTER — Ambulatory Visit (INDEPENDENT_AMBULATORY_CARE_PROVIDER_SITE_OTHER): Payer: BC Managed Care – PPO | Admitting: Internal Medicine

## 2013-07-11 ENCOUNTER — Encounter: Payer: Self-pay | Admitting: Internal Medicine

## 2013-07-11 VITALS — BP 114/70 | HR 96 | Ht 62.5 in | Wt 188.2 lb

## 2013-07-11 DIAGNOSIS — G4733 Obstructive sleep apnea (adult) (pediatric): Secondary | ICD-10-CM

## 2013-07-11 DIAGNOSIS — R079 Chest pain, unspecified: Secondary | ICD-10-CM

## 2013-07-11 DIAGNOSIS — R0602 Shortness of breath: Secondary | ICD-10-CM

## 2013-07-11 NOTE — Patient Instructions (Signed)
We can continue present meds  Please call as needed 

## 2013-07-11 NOTE — Progress Notes (Signed)
01/05/13- 71 yoF never smoker transferring from Dr Melvyn Novas. Self referred- Father my pt, is here. Former MW patient for pulmonary-Increased SOB, chest pain and tightness.  Hx Cardiac w/u with clear coronaries on cath around 2006, no ischemia on nuc study, Nl EF w/ Gr II diastolic dysfunction, no pulmonary hypertension.  Office spirometry 10/27/12- FVC2.32/ 71%, FEV1 2.02/ 73%, FEV1/FVC 0.87, FEF 25-75% 94% CT chest 2/5 /14: IMPRESSION:  Negative for significant acute pulmonary embolus by CTA.  Low volume exam with scattered diffuse bilateral patchy ground-  glass attenuation which could represent early edema/volume overload  or mild alveolitis.  Original Report Authenticated By: Jerilynn Mages. Annamaria Boots, M.D .Main concern for this visit:  Obstructive Sleep apnea Daytime sleepiness, snoring. Wakes tired.  NPSG- 12/15/10- Dr Jaynie [facility]- RDI 6.8/ hr. CPAP 10,, [facility], full face mask, humidifier Bedtime 10PM-MN, short latency, no WASO, up 5:00-5:30 AM. Describes easy DOE. Leaning on cart in store. Hx of pneumonia or bronchitis about 1x/ year. Pneumovax caused large local reaction. Denies hx of asthma. Some pollen rhinitis in Spring. Hx chest pain in January, 2014 at sternum, extending L arm, cleared by cardiology and dx'd w/ pancreatitis.  Says she is stressed by her job.  01/24/13- 69 yoF never smoker transfer from Dr Melvyn Novas.  Dyspnea, OSA, abnormal CT  Self referred-  FOLLOWS FOR: recent ED visit for Chest pain and  Pt states she continues to have unexplained SOB and states her pulse keeps going up(92 last night at dinner when she experienced the SOB). Today reports "better than usual". Episodic palpitation and substernal soreness. Went to [facility] to get CPAP adjusted. They noted her labored breathing and referred her here. Denies feeling anxious. Any sustained walking -labored breathing. CPAP 10, compliant,  Download suggests 15 cwp. Avg use 3hrs 53 minutes. ECHO 2/44/01- Gr 2 diastolic  dysfunction/CHF, no PHTN Has appointment at [facility] for f/u previous question of pancreatitis, because she had an elevated lipase.  ABG 10/12/12 (? FiO2), pH 7.609, PCO2 20.3, PO2 122, HCO3 20.5- consistent w/ acute respiratory alkalosis. BMET 01/17/13- Anion Gap 12 CT chest 01/17/13- reviewed w/ her IMPRESSION:  No acute findings. No evidence of pulmonary embolus.  Original Report Authenticated By: Rolm Baptise, M.D.  02/03/13- 24 yoF never smoker transfer from Dr Melvyn Novas.  Dyspnea, OSA, abnormal CT  Self referred-  SOB. Pt had PFT and SMW. Pt reports breathing is getting worse. She has had several "coughing fits" in the past couple days, wheezing and chest tx. Father here. Denies routine headache, double vision, numbness. Dr. Greta Doom manages her thyroid. Had neurologic evaluation by Dr Brett Fairy. Migraine evaluation by Dr Catalina Gravel. GI evaluation at [facility].  PFT 02/03/13- severe restriction of total lung capacity, normal spirometry flows for volume with insignificant response to bronchodilator, diffusion moderately reduced. TLC 49%, DLCO 62%. FVC 2.19/62%, FEV1 1.95/68%, FEV1/FVC 89/108%, FEF 25-75% 88/104%. 6 minute walk test 02/03/2013-98%, 99%, 99%, 303 m. Methacholine inhalation challenge test 01/27/2013 positive for hyperreactive airways at stage I.0  02/20/13- 42 yoF never smoker followed for asthma, episodic dyspnea, restrictive PFT,  hyperventilation, OSA, anxiety  Hx abnormal CT School year is over so she is less stressed. Says breathing has been better. Has not needed Librax. Easy exertional dyspnea makes her afraid to try activities. Likes CPAP 10/ [facility].  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  Out of [facility] sample. Shows pictures she says demonstrate mold in her bus, which has been sent for cleaning.  CPAP 10/ [facility]- thinks this is doing well, used every night.  07/11/13- 2 yoF never smoker followed for asthma, episodic dyspnea, restrictive PFT,  hyperventilation, OSA, anxiety  Hx abnormal CT FOLLOWS FOR: patient states she is dong alot better except when doing up hill things.  Has had flu vaccine. Seeing a chiropractor for pulled muscle in her back. 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 Good compliance and control with CPAP 10/ [facility]  ROS-see HPI Constitutional:   No-   weight loss, night sweats, fevers, chills, +fatigue, lassitude. HEENT:   No-  headaches, difficulty swallowing, tooth/dental problems, sore throat,       + sneezing, itching, no-ear ache, +nasal congestion, +post nasal drip,  CV:  No-chest pain/ pressure, no-orthopnea, PND, swelling in lower extremities, anasarca,                                               dizziness, palpitations Resp: +  shortness of breath with exertion or at rest.              No-   productive cough,  No non-productive cough,  No- coughing up of blood.              No-   change in color of mucus.  No- wheezing.   Skin: No-   rash or lesions. GI:  No-   heartburn, indigestion, abdominal pain, nausea, vomiting, GU:  MS:  No-   joint pain or swelling. + back pain Neuro-     nothing unusual Psych:  No- change in mood or affect. No depression + anxiety.  No memory loss.  OBJ- Physical Exam  She still comes across as tense, anxious, restrained- better today. + guarding her             back General- Alert, Oriented, Affect- +anxious. +shallow/ hyperventilating at this visit during conversation. Skin- some rash around ankles, early stasis dermatitis or leukocytoclastic angiitis Lymphadenopathy- none Head- atraumatic            Eyes- Gross vision intact, PERRLA,  conjunctivae and secretions clear            Ears- Hearing, canals-normal            Nose- Clear, no-Septal dev, mucus, polyps, erosion, perforation             Throat- Mallampati III , mucosa clear , drainage- none, tonsils- atrophic Neck- flexible , trachea midline, no stridor , thyroid nl, carotid no bruit Chest - symmetrical excursion , unlabored           Heart/CV- RRR , no murmur , no gallop  , no rub, nl s1 s2                           - JVD- none , edema- none, stasis changes- none, varices- none           Lung- clear to P&A, wheeze- none, cough- none , dullness-none, rub- none.  Chest wall-  Abd-  Br/ Gen/ Rectal- Not done, not indicated Extrem- cyanosis- none, clubbing, none, atrophy- none, strength- nl Neuro- grossly intact to observation

## 2013-07-26 ENCOUNTER — Encounter: Payer: Self-pay | Admitting: Internal Medicine

## 2013-07-26 NOTE — Assessment & Plan Note (Signed)
Good compliance and control 

## 2013-07-26 NOTE — Assessment & Plan Note (Signed)
Current pattern is consistent with a pulled muscle in her back. Chiropractor seems to be helping her

## 2013-07-26 NOTE — Assessment & Plan Note (Signed)
Her markedly exaggerated breathlessness might be explained by anxiety Plan-continue present treatment. I encouraged walking for stamina

## 2013-11-07 ENCOUNTER — Ambulatory Visit: Payer: BC Managed Care – PPO | Admitting: Internal Medicine

## 2013-11-27 ENCOUNTER — Encounter: Payer: Self-pay | Admitting: Internal Medicine

## 2013-11-27 ENCOUNTER — Ambulatory Visit (INDEPENDENT_AMBULATORY_CARE_PROVIDER_SITE_OTHER): Payer: BC Managed Care – PPO | Admitting: Internal Medicine

## 2013-11-27 VITALS — BP 116/60 | HR 95 | Ht 62.5 in | Wt 191.0 lb

## 2013-11-27 DIAGNOSIS — R0602 Shortness of breath: Secondary | ICD-10-CM

## 2013-11-27 NOTE — Patient Instructions (Signed)
Ok to continue CPAP [facility]  Ok to stay off [facility] for now and to use the rescue inhaler only if needed

## 2013-11-27 NOTE — Progress Notes (Signed)
01/05/13- 71 yoF never smoker transferring from Dr Melvyn Novas. Self referred- Father my pt, is here. Former MW patient for pulmonary-Increased SOB, chest pain and tightness.  Hx Cardiac w/u with clear coronaries on cath around 2006, no ischemia on nuc study, Nl EF w/ Gr II diastolic dysfunction, no pulmonary hypertension.  Office spirometry 10/27/12- FVC2.32/ 71%, FEV1 2.02/ 73%, FEV1/FVC 0.87, FEF 25-75% 94% CT chest 2/5 /14: IMPRESSION:  Negative for significant acute pulmonary embolus by CTA.  Low volume exam with scattered diffuse bilateral patchy ground-  glass attenuation which could represent early edema/volume overload  or mild alveolitis.  Original Report Authenticated By: Jerilynn Mages. Annamaria Boots, M.D .Main concern for this visit:  Obstructive Sleep apnea Daytime sleepiness, snoring. Wakes tired.  NPSG- 12/15/10- Dr [facility]- RDI 6.8/ hr. CPAP 10,, [facility], full face mask, humidifier Bedtime 10PM-MN, short latency, no WASO, up 5:00-5:30 AM. Describes easy DOE. Leaning on cart in store. Hx of pneumonia or bronchitis about 1x/ year. Pneumovax caused large local reaction. Denies hx of asthma. Some pollen rhinitis in Spring. Hx chest pain in January, 2014 at sternum, extending L arm, cleared by cardiology and dx'd w/ pancreatitis.  Says she is stressed by her job.  01/24/13- 69 yoF never smoker transfer from Dr Melvyn Novas.  Dyspnea, OSA, abnormal CT  Self referred-  FOLLOWS FOR: recent ED visit for Chest pain and  Pt states she continues to have unexplained SOB and states her pulse keeps going up(92 last night at dinner when she experienced the SOB). Today reports "better than usual". Episodic palpitation and substernal soreness. Went to [facility] to get CPAP adjusted. They noted her labored breathing and referred her here. Denies feeling anxious. Any sustained walking -labored breathing. CPAP 10, compliant,  Download suggests 15 cwp. Avg use 3hrs 53 minutes. ECHO 2/44/01- Gr 2 diastolic  dysfunction/CHF, no PHTN Has appointment at [place] for f/u previous question of pancreatitis, because she had an elevated lipase.  ABG 10/12/12 (? FiO2), pH 7.609, PCO2 20.3, PO2 122, HCO3 20.5- consistent w/ acute respiratory alkalosis. BMET 01/17/13- Anion Gap 12 CT chest 01/17/13- reviewed w/ her IMPRESSION:  No acute findings. No evidence of pulmonary embolus.  Original Report Authenticated By: Rolm Baptise, M.D.  02/03/13- 24 yoF never smoker transfer from Dr Melvyn Novas.  Dyspnea, OSA, abnormal CT  Self referred-  SOB. Pt had PFT and SMW. Pt reports breathing is getting worse. She has had several "coughing fits" in the past couple days, wheezing and chest tx. Father here. Denies routine headache, double vision, numbness. Dr. Greta Doom manages her thyroid. Had neurologic evaluation by Dr Brett Fairy. Migraine evaluation by Dr Catalina Gravel. GI evaluation at [facility].  PFT 02/03/13- severe restriction of total lung capacity, normal spirometry flows for volume with insignificant response to bronchodilator, diffusion moderately reduced. TLC 49%, DLCO 62%. FVC 2.19/62%, FEV1 1.95/68%, FEV1/FVC 89/108%, FEF 25-75% 88/104%. 6 minute walk test 02/03/2013-98%, 99%, 99%, 303 m. Methacholine inhalation challenge test 01/27/2013 positive for hyperreactive airways at stage I.0  02/20/13- 42 yoF never smoker followed for asthma, episodic dyspnea, restrictive PFT,  hyperventilation, OSA, anxiety  Hx abnormal CT School year is over so she is less stressed. Says breathing has been better. Has not needed Librax. Easy exertional dyspnea makes her afraid to try activities. Likes CPAP 10/ [facility].  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  Out of [facility] sample. Shows pictures she says demonstrate mold in her bus, which has been sent for cleaning.  CPAP 10/ [facility]- thinks this is doing well, used every night.  07/11/13- 2 yoF never smoker followed for asthma, episodic dyspnea, restrictive PFT,  hyperventilation, OSA, anxiety  Hx abnormal CT FOLLOWS FOR: patient states she is dong alot better except when doing up hill things.  Has had flu vaccine. Seeing a chiropractor for pulled muscle in her back. 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 Good compliance and control with CPAP 10/ [facility]  11/27/13-  9 yoF never smoker followed for asthma, episodic dyspnea, restrictive PFT,  hyperventilation, OSA, anxiety  Hx abnormal CT FOLLOWS FOR: Feb 2015 had PNA, flu, and sinus infection all together. SOB has not been bad since then. Good compliance and control with CPAP 10/ [facility] Still drives school bus. Worked through sinusitis/ Flu/?pneumonia- treated with augmentin, Z pak, cephalexin, augmentin again, then avelox. Now "doiung a little bit better". Finishes avelox tomorrow. Off Dulera for now. No recent wheeze.   ROS-see HPI Constitutional:   No-   weight loss, night sweats, fevers, chills, +fatigue, lassitude. HEENT:   No-  headaches, difficulty swallowing, tooth/dental problems, sore throat,       No-sneezing, itching, no-ear ache, +nasal congestion, +post nasal drip,  CV:  No-chest pain/ pressure, no-orthopnea, PND, swelling in lower extremities, anasarca,                                               dizziness, palpitations Resp: +  shortness of breath with exertion or at rest.              No-   productive cough,  No non-productive cough,  No- coughing up of blood.              No-   change in color of mucus.  No- wheezing.   Skin: No-   rash or lesions. GI:  No-   heartburn, indigestion, abdominal pain, nausea, vomiting, GU:  MS:  No-   joint pain or  swelling. + back pain Neuro-     nothing unusual Psych:  No- change in mood or affect. No depression + anxiety.  No memory loss.  OBJ- Physical Exam   General- Alert, Oriented, Affect- calm, Skin- some rash around ankles, early stasis dermatitis or leukocytoclastic angiitis Lymphadenopathy- none Head- atraumatic            Eyes- Gross vision intact, PERRLA, conjunctivae and secretions clear            Ears- Hearing, canals-normal            Nose- Clear, no-Septal dev, mucus, polyps, erosion, perforation             Throat- Mallampati III , mucosa clear , drainage- none, tonsils- atrophic Neck- flexible , trachea midline, no stridor , thyroid nl, carotid no bruit Chest - symmetrical excursion , unlabored           Heart/CV- RRR , no murmur , no gallop  , no rub, nl s1 s2                           -  JVD- none , edema- none, stasis changes- none, varices- none           Lung- clear to P&A, wheeze- none, cough- none , dullness-none, rub- none.           Chest wall-  Abd-  Br/ Gen/ Rectal- Not done, not indicated Extrem- cyanosis- none, clubbing, none, atrophy- none, strength- nl Neuro- grossly intact to observation

## 2013-12-18 NOTE — Assessment & Plan Note (Addendum)
Followed in Pulmonary clinic/ [place] 10/12/12  >>  7.61/20/122  - 10/27/2012   Walked RA x one lap @ 185 stopped due to  Chest tight, no sob, no desat - Spirometry with non-physiologic f/v loop 10/27/12 but no obst CT chest 10/12/12 Negative for significant acute pulmonary embolus by CTA.  Low volume exam with scattered diffuse bilateral patchy ground-  glass attenuation which could represent early edema/volume overload  or mild alveolitis. ECHO 0/10/93- Gr 2 diastolic dysfunction/CHF, no PHTN  PFT 02/03/13- severe restriction of total lung capacity, normal spirometry flows for volume with insignificant response to bronchodilator, diffusion moderately reduced. TLC 49%, DLCO 62%. FVC 2.19/62%, FEV1 1.95/68%, FEV1/FVC 89/108%, FEF 25-75% 88/104%. 6 minute walk test 02/03/2013-98%, 99%, 99%, 303 m. Methacholine inhalation challenge test 01/27/2013 positive for hyperreactive airways at stage I.0 Has appointment at [place] for f/u previous question of pancreatitis, because she had an elevated lipase.  ABG 10/12/12 (? FiO2), pH 7.609, PCO2 20.3, PO2 122, HCO3 20.5- consistent w/ acute respiratory alkalosis.  BMET 01/17/13- Anion Gap 12 CT 01/17/13- Neg for PE Chronic compensated respiratory alkalosis --------------------------------------------------------------------- Managed as possible mild, intermittent asthma, with anxiety.  Plan- ok to watch off [facility] for now.

## 2014-01-04 ENCOUNTER — Other Ambulatory Visit: Payer: Self-pay | Admitting: Otolaryngology

## 2014-01-18 ENCOUNTER — Other Ambulatory Visit: Payer: Self-pay

## 2014-01-18 DIAGNOSIS — Z1231 Encounter for screening mammogram for malignant neoplasm of breast: Secondary | ICD-10-CM

## 2014-02-01 IMAGING — CT CT ANGIO CHEST
1 of 2 series · 20 of 32 positions shown · IV contrast (OMNIPAQUE 300)
Comparison: 10/12/2012

CLINICAL DATA: Shortness of breath, chest pain.

CT ANGIOGRAPHY CHEST
TECHNIQUE: Multidetector CT imaging of the chest using the
standard protocol during bolus administration of intravenous
contrast. Multiplanar reconstructed images including MIPs were
obtained and reviewed to evaluate the vascular anatomy.
Contrast: 100mL OMNIPAQUE IOHEXOL 350 MG/ML SOLN

[Series 6: thins for pacs · axial · 0.61mm/px · z∈[+1017,+1197]mm · 20 of 200 slices shown]
[im 10/200  lung]
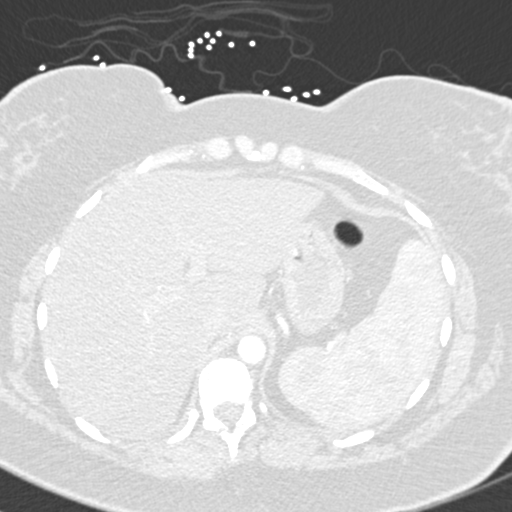
[im 20/200  mediastinal]
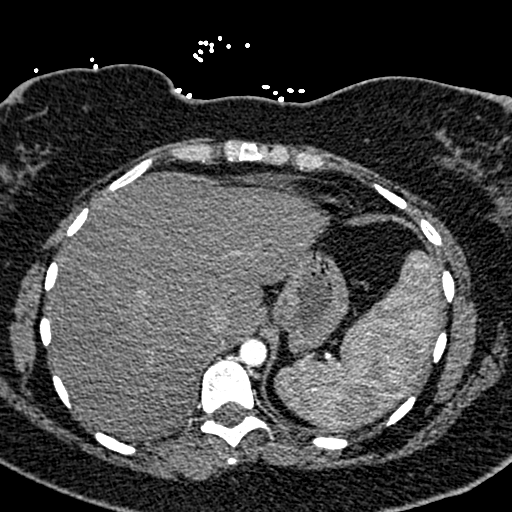
[im 30/200  lung]
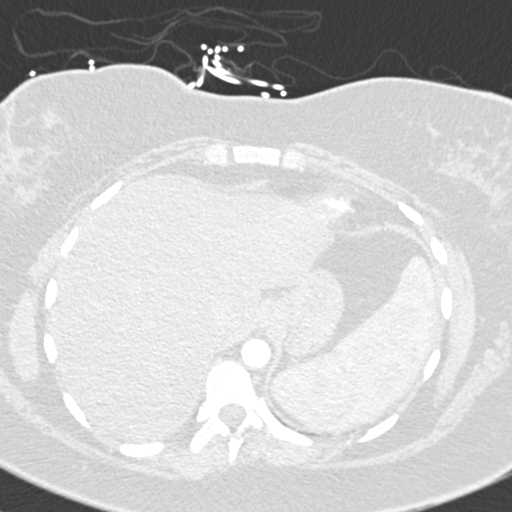
[im 40/200  mediastinal]
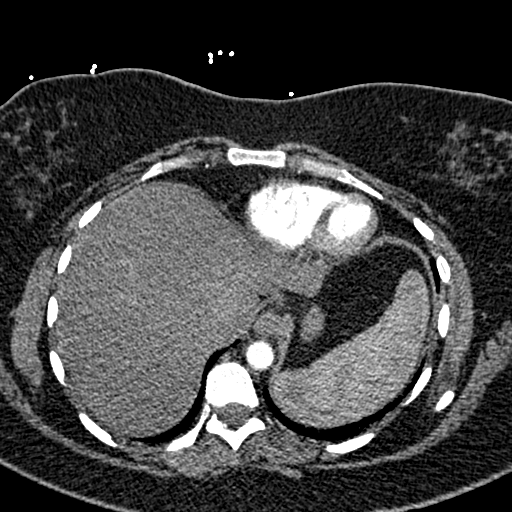
[im 50/200  lung]
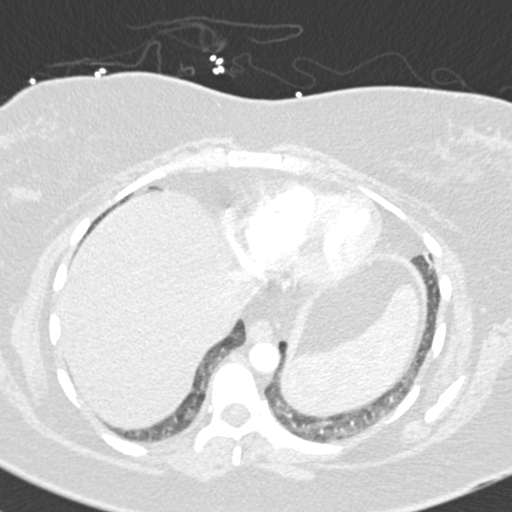
[im 67/200  mediastinal]
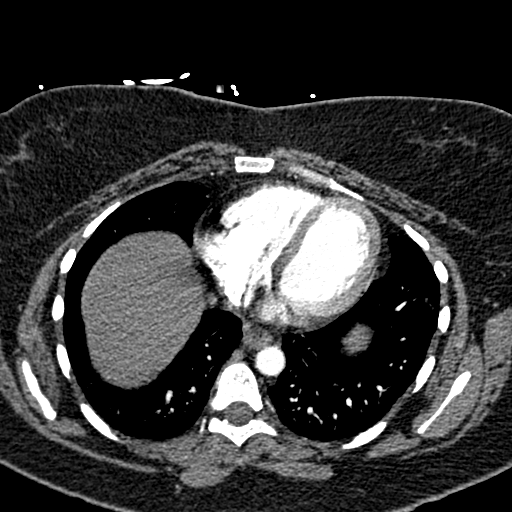
[im 70/200  lung]
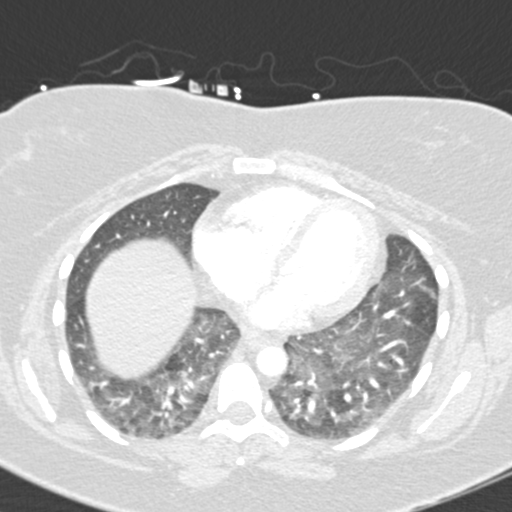
[im 80/200  mediastinal]
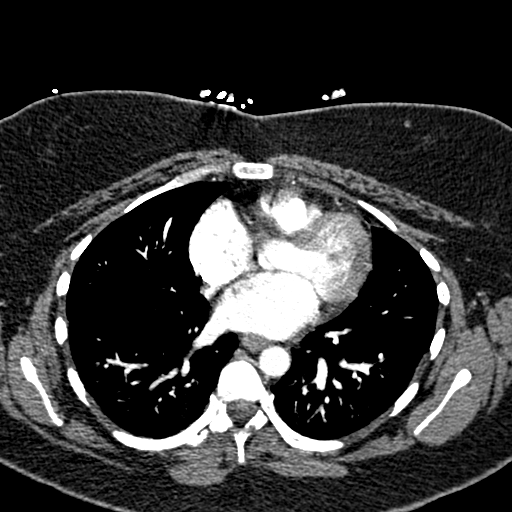
[im 90/200  lung]
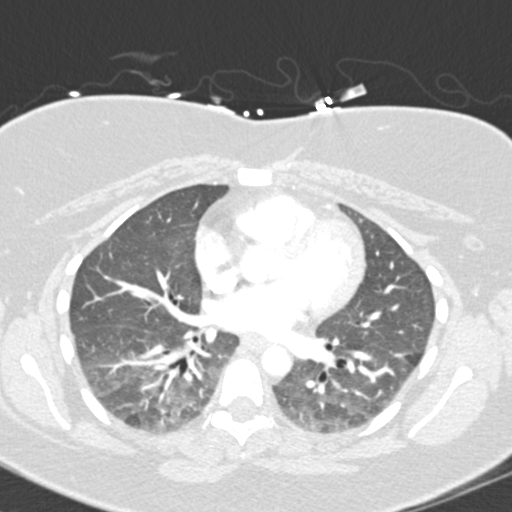
[im 91/200  mediastinal]
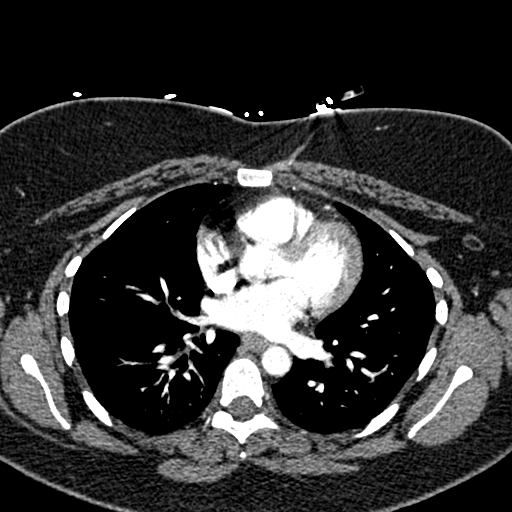
[im 100/200  lung]
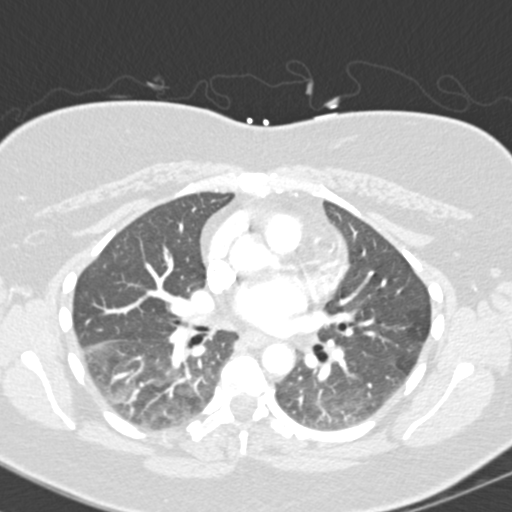
[im 110/200  mediastinal]
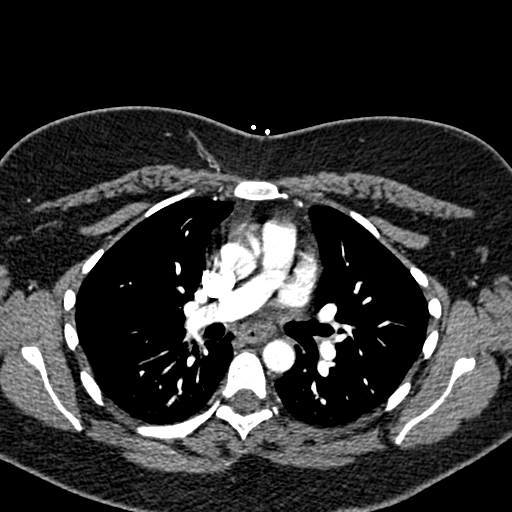
[im 120/200  lung]
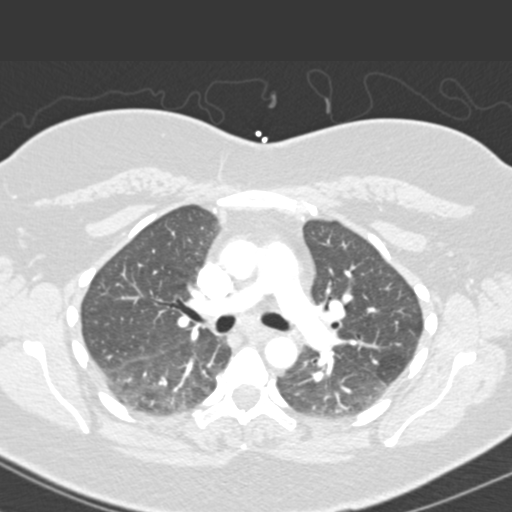
[im 130/200  mediastinal]
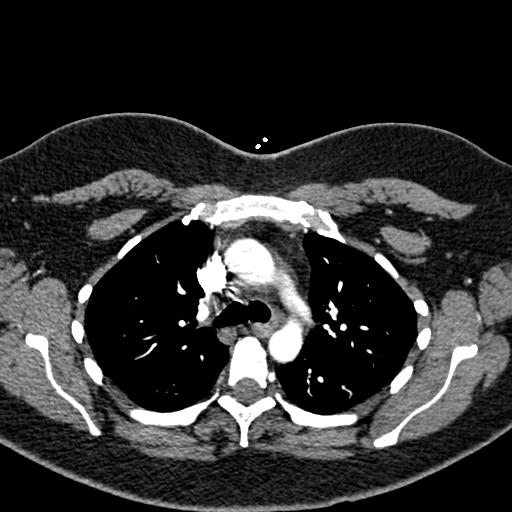
[im 133/200  lung]
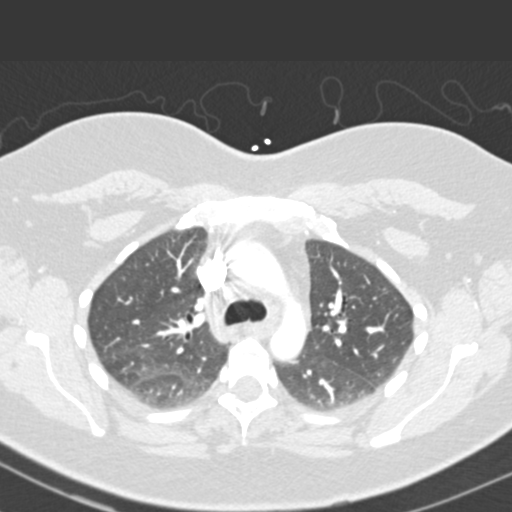
[im 150/200  mediastinal]
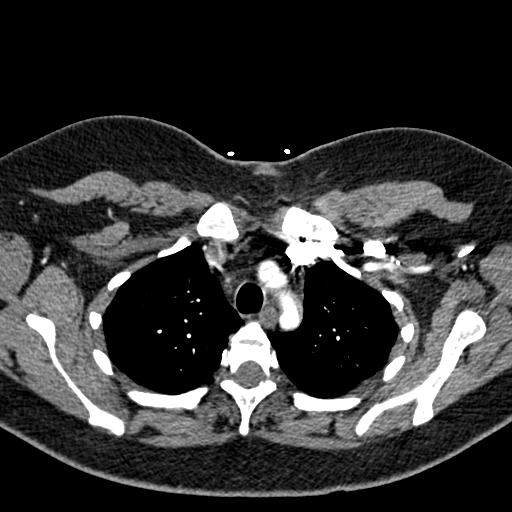
[im 160/200  lung]
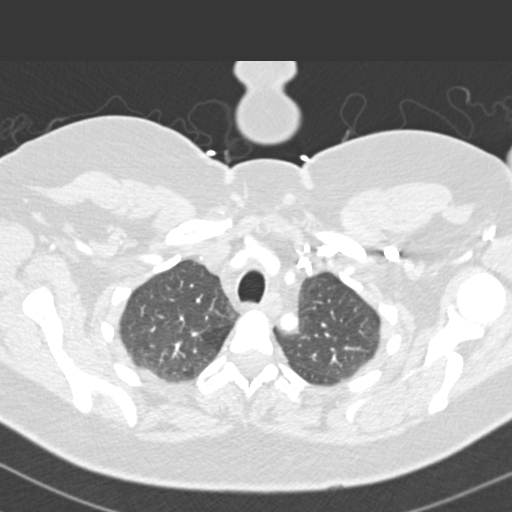
[im 170/200  mediastinal]
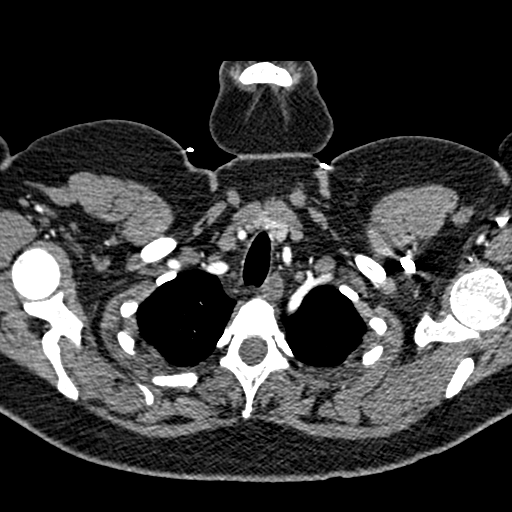
[im 180/200  lung]
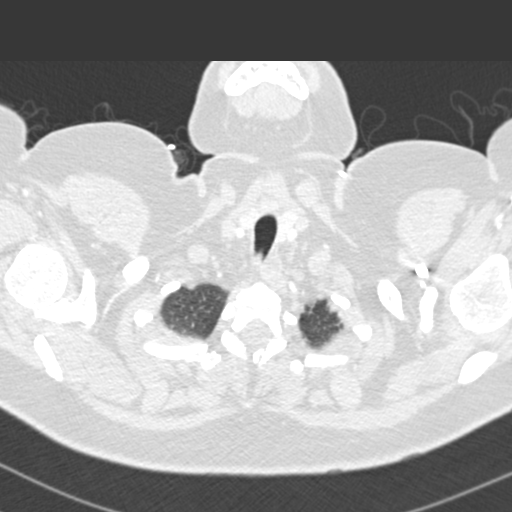
[im 190/200  mediastinal]
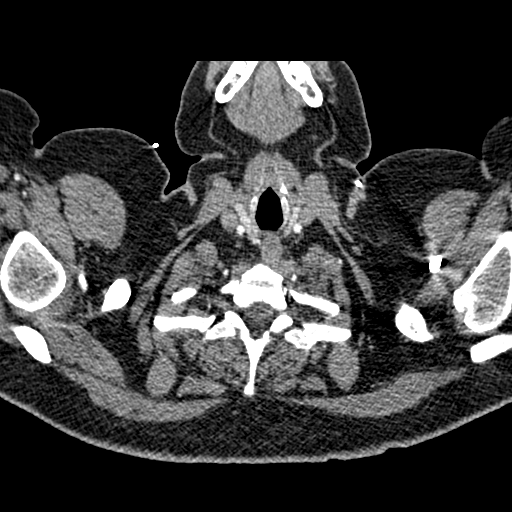

[20 of 32 positions shown; findings below may reference images not displayed]

FINDINGS: No filling defects in the pulmonary arteries to suggest
pulmonary emboli. No mediastinal, hilar, or axillary adenopathy.
Heart is normal size. Aorta is normal caliber. Visualized thyroid
and chest wall soft tissues unremarkable.  No confluent airspace
opacities or effusions. Imaging into the upper abdomen shows no
acute findings.
IMPRESSION: No acute findings.  No evidence of pulmonary embolus.

## 2014-02-05 ENCOUNTER — Ambulatory Visit
Admission: RE | Admit: 2014-02-05 | Discharge: 2014-02-05 | Disposition: A | Discharge status: Home / Self Care | Payer: BC Managed Care – PPO | Source: Ambulatory Visit

## 2014-02-05 DIAGNOSIS — Z1231 Encounter for screening mammogram for malignant neoplasm of breast: Secondary | ICD-10-CM

## 2014-02-19 ENCOUNTER — Telehealth: Payer: Self-pay | Admitting: Internal Medicine

## 2014-02-19 NOTE — Telephone Encounter (Signed)
Per CY-okay to schedule skin testing. I will call and schedule OV as I need to go over the testing instructions with patient.

## 2014-02-19 NOTE — Telephone Encounter (Signed)
I called spoke with pt. She is requesting to get set up with allergy testing. She was told her ENT Dr. Wilburn Cornelia suggested she see [place] for this. She already see's Dr. Annamaria Boots. Last OV 11/27/13. She had sinus surgery in April. Please advise Dr. Annamaria Boots thanks

## 2014-02-21 NOTE — Telephone Encounter (Signed)
I called and spoke with patient-she is aware that I have placed her on allergy testing schedule for March 07, 2014 at 3pm. Mailed copy of protocol to patient at verified home address.

## 2014-03-07 ENCOUNTER — Encounter: Payer: Self-pay | Admitting: Internal Medicine

## 2014-03-07 ENCOUNTER — Ambulatory Visit (INDEPENDENT_AMBULATORY_CARE_PROVIDER_SITE_OTHER): Payer: BC Managed Care – PPO | Admitting: Internal Medicine

## 2014-03-07 VITALS — BP 138/70 | HR 85 | Ht 62.5 in | Wt 186.0 lb

## 2014-03-07 DIAGNOSIS — G4733 Obstructive sleep apnea (adult) (pediatric): Secondary | ICD-10-CM

## 2014-03-07 DIAGNOSIS — R55 Syncope and collapse: Secondary | ICD-10-CM

## 2014-03-07 DIAGNOSIS — R0602 Shortness of breath: Secondary | ICD-10-CM

## 2014-03-07 MED ORDER — EPINEPHRINE 0.3 MG/0.3ML IJ SOAJ
0.3000 mg | Freq: Once | INTRAMUSCULAR | Status: AC
  Administered 2014-03-07: 0.3 mg via INTRAMUSCULAR

## 2014-03-07 NOTE — Assessment & Plan Note (Signed)
Atopic skin test response to common grass, tree, weed pollens, house dust, cat and dog. Testing was interrupted when she fainted, before placing tests for molds. 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 Plan-consider further testing for molds if appropriate on future discussion. We will discuss utilization of this information at her next office visit.

## 2014-03-07 NOTE — Assessment & Plan Note (Signed)
See details in progress note. Satisfactory resolution with no injury.

## 2014-03-07 NOTE — Progress Notes (Deleted)
3:45 pm: BP 98/54, pulse 37, o2 sat 100% RA.  Epi pen given.  Pt placed on cardiac monitor and on NRB. 3:47pm: Pulse 56 3:50 pm: BP: 110/68, pulse 63, o2 sat 100% on NRB 3:55pm: BP: 108/70, pulse 71, o2 sat 100% on NRB 4:00 pm: BP: 104/74, pulse 78, o2 sat 100: on NRB 4:05 pm: BP: 116/74, pulse 74, o2 sat 100% on NRB 4:10 pm: BP: 110/76, pulse 74, o2 sat 100% on NRB 4:15 pm - after standing up and getting into chair: BP: 100/60, pulse 74, o2 sat 100% NRB 4:20 pm: BP: 118/76, pulse 80, o2 sat 100% NRB 4:25 pm: BP: 116/74, pulse 81, o2 sat 100% NRB 4:30 pm: pt placed on RA and given fexofenadine 180 mg x 1 po. 4:35 pm: BP: 124/86, pulse 90, o2 sat 99% RA 4:45pm: BP: 124,84, pulse 90, o2 sat 99% RA 4:50 pm: after getting dressed - BP 126/82, pulse 84, o2 sat 94% RA

## 2014-03-07 NOTE — Patient Instructions (Signed)
You got faint from getting the skin tests, but this was not an allergic reaction. Take it easy this evening, You can resume your usual activities as you feel like it.  Take your usual meds on schedule as before.  Please call if you have any problems

## 2014-03-07 NOTE — Progress Notes (Signed)
01/05/13- 54 yoF never smoker transferring from Dr Melvyn Novas. Self referred- Father my pt, is here. Former MW patient for pulmonary-Increased SOB, chest pain and tightness.  Hx Cardiac w/u with clear coronaries on cath around 2006, no ischemia on nuc study, Nl EF w/ Gr II diastolic dysfunction, no pulmonary hypertension.  Office spirometry 10/27/12- FVC2.32/ 71%, FEV1 2.02/ 73%, FEV1/FVC 0.87, FEF 25-75% 94% CT chest 2/5 /14: IMPRESSION:  Negative for significant acute pulmonary embolus by CTA.  Low volume exam with scattered diffuse bilateral patchy ground-  glass attenuation which could represent early edema/volume overload  or mild alveolitis.  Original Report Authenticated By: Jerilynn Mages. Annamaria Boots, M.D .Main concern for this visit:  Obstructive Sleep apnea Daytime sleepiness, snoring. Wakes tired.  NPSG- 12/15/10- Dr [facility]- RDI 6.8/ hr. CPAP 10,, [facility], full face mask, humidifier Bedtime 10PM-MN, short latency, no WASO, up 5:00-5:30 AM. Describes easy DOE. Leaning on cart in store. Hx of pneumonia or bronchitis about 1x/ year. Pneumovax caused large local reaction. Denies hx of asthma. Some pollen rhinitis in Spring. Hx chest pain in January, 2014 at sternum, extending L arm, cleared by cardiology and dx'd w/ pancreatitis.  Says she is stressed by her job.  01/24/13- 51 yoF never smoker transfer from Dr Melvyn Novas.  Dyspnea, OSA, abnormal CT  Self referred-  FOLLOWS FOR: recent ED visit for Chest pain and  Pt states she continues to have unexplained SOB and states her pulse keeps going up(92 last night at dinner when she experienced the SOB). Today reports "better than usual". Episodic palpitation and substernal soreness. Went to [facility] to get CPAP adjusted. They noted her labored breathing and referred her here. Denies feeling anxious. Any sustained walking -labored breathing. CPAP 10, compliant,  Download suggests 15 cwp. Avg use 3hrs 53 minutes. ECHO 9/62/83- Gr 2 diastolic  dysfunction/CHF, no PHTN Has appointment at [place] for f/u previous question of pancreatitis, because she had an elevated lipase.  ABG 10/12/12 (? FiO2), pH 7.609, PCO2 20.3, PO2 122, HCO3 20.5- consistent w/ acute respiratory alkalosis. BMET 01/17/13- Anion Gap 12 CT chest 01/17/13- reviewed w/ her IMPRESSION:  No acute findings. No evidence of pulmonary embolus.  Original Report Authenticated By: Rolm Baptise, M.D.  02/03/13- 31 yoF never smoker transfer from Dr Melvyn Novas.  Dyspnea, OSA, abnormal CT  Self referred-  SOB. Pt had PFT and SMW. Pt reports breathing is getting worse. She has had several "coughing fits" in the past couple days, wheezing and chest tx. Father here. Denies routine headache, double vision, numbness. Dr. Greta Doom manages her thyroid. Had neurologic evaluation by Dr Brett Fairy. Migraine evaluation by Dr Catalina Gravel. GI evaluation at [facility].  PFT 02/03/13- severe restriction of total lung capacity, normal spirometry flows for volume with insignificant response to bronchodilator, diffusion moderately reduced. TLC 49%, DLCO 62%. FVC 2.19/62%, FEV1 1.95/68%, FEV1/FVC 89/108%, FEF 25-75% 88/104%. 6 minute walk test 02/03/2013-98%, 99%, 99%, 303 m. Methacholine inhalation challenge test 01/27/2013 positive for hyperreactive airways at stage I.0  02/20/13- 42 yoF never smoker followed for asthma, episodic dyspnea, restrictive PFT,  hyperventilation, OSA, anxiety  Hx abnormal CT School year is over so she is less stressed. Says breathing has been better. Has not needed Librax. Easy exertional dyspnea makes her afraid to try activities. Likes CPAP 10/ [facility].  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  Out of [facility] sample. Shows pictures she says demonstrate mold in her bus, which has been sent for cleaning.  CPAP 10/ [facility]- thinks this is doing well, used every night.  07/11/13- 2 yoF never smoker followed for asthma, episodic dyspnea, restrictive PFT,  hyperventilation, OSA, anxiety  Hx abnormal CT FOLLOWS FOR: patient states she is dong alot better except when doing up hill things.  Has had flu vaccine. Seeing a chiropractor for pulled muscle in her back. 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 Good compliance and control with CPAP 10/ [facility]  11/27/13-  11 yoF never smoker followed for asthma, episodic dyspnea, restrictive PFT,  hyperventilation, OSA, anxiety  Hx abnormal CT FOLLOWS FOR: Feb 2015 had PNA, flu, and sinus infection all together. SOB has not been bad since then. Good compliance and control with CPAP 10/ [facility] Still drives school bus. Worked through sinusitis/ Flu/?pneumonia- treated with augmentin, Z pak, cephalexin, augmentin again, then avelox. Now "doiung a little bit better". Finishes avelox tomorrow. Off Dulera for now. No recent wheeze.   03/07/14- 43 yoF never smoker followed for asthma, episodic dyspnea, restrictive PFT,  hyperventilation, OSA, anxiety  Hx abnormal CT Sinus bx 5/4//15- benign chronic sinusitis No antihistamines, OTC cough syrups, or OTC sleep aids in past 3 days. Allergy skin test 03/07/14- significant positive intradermal tests for grass, weed and tree pollens, dust mite, feathers cat and dog. She had a vasovagal faint-not an anaphylactic reaction-during intradermal testing. She was gently lowered to the floor, given an EpiPen, placed on cardiac monitor, oximeter and supplemental oxygen. She became fully responsive without injury or chest pain. Monitor showed regular sinus rhythm throughout. There was no rash, wheezing, nausea or vomiting.She was kept for prolonged surveillance,  gradually mobilized and allowed to sit with supervision first in the exam room and then in the waiting room. When  satisfied that she was stable and comfortable she was released to return home with no further problems.Sequential vital signs are recorded below. She was given an Allegra antihistamine at departure.   ROS-see HPI Constitutional:   No-   weight loss, night sweats, fevers, chills, +fatigue, lassitude. HEENT:   No-  headaches, difficulty swallowing, tooth/dental problems, sore throat,       No-sneezing, itching, no-ear ache, +nasal congestion, +post nasal drip,  CV:  No-chest pain/ pressure, no-orthopnea, PND, swelling in lower extremities, anasarca,                                               dizziness, palpitations Resp: +  shortness of breath with exertion or at rest.              No-   productive cough,  No non-productive cough,  No- coughing up of blood.              No-   change in color of mucus.  No- wheezing.   Skin: No-   rash or lesions. GI:  No-   heartburn, indigestion, abdominal pain, nausea, vomiting, GU:  MS:  No-   joint pain or swelling. + back pain Neuro-     nothing unusual Psych:  No- change in mood  or affect. No depression + anxiety.  No memory loss.  OBJ- Physical Exam   General- Alert, Oriented, Affect- calm, Skin- clear Lymphadenopathy- none Head- atraumatic            Eyes- Gross vision intact, PERRLA, conjunctivae and secretions clear            Ears- Hearing, canals-normal            Nose- Clear, no-Septal dev, mucus, polyps, erosion, perforation             Throat- Mallampati III , mucosa clear , drainage- none, tonsils- atrophic Neck- flexible , trachea midline, no stridor , thyroid nl, carotid no bruit Chest - symmetrical excursion , unlabored           Heart/CV- RRR , no murmur , no gallop  , no rub, nl s1 s2                           - JVD- none , edema- none, stasis changes- none, varices- none           Lung- clear to P&A, wheeze- none,  cough- none , dullness-none, rub- none.           Chest wall-  Abd-  Br/ Gen/ Rectal- Not done, not indicated Extrem- cyanosis- none, clubbing, none, atrophy- none, strength- nl Neuro- grossly intact to observation After complaining of malaise, dizziness and noting clammy skin, thready pulse, she was lowered to the floor. NSR, clear lungs, spontaneous breathing.  3:45 pm: BP 98/54, pulse 37, o2 sat 100% RA. Epi pen given. Pt placed on cardiac monitor and on NRB.  3:47pm: Pulse 56  3:50 pm: BP: 110/68, pulse 63, o2 sat 100% on NRB  3:55pm: BP: 108/70, pulse 71, o2 sat 100% on NRB  4:00 pm: BP: 104/74, pulse 78, o2 sat 100: on NRB  4:05 pm: BP: 116/74, pulse 74, o2 sat 100% on NRB  4:10 pm: BP: 110/76, pulse 74, o2 sat 100% on NRB  4:15 pm - after standing up and getting into chair: BP: 100/60, pulse 74, o2 sat 100% NRB  4:20 pm: BP: 118/76, pulse 80, o2 sat 100% NRB  4:25 pm: BP: 116/74, pulse 81, o2 sat 100% NRB  4:30 pm: pt placed on RA and given fexofenadine 180 mg x 1 po.  4:35 pm: BP: 124/86, pulse 90, o2 sat 99% RA  4:45pm: BP: 124,84, pulse 90, o2 sat 99% RA  4:50 pm: after getting dressed - BP 126/82, pulse 84, o2 sat 94% RA

## 2014-03-07 NOTE — Assessment & Plan Note (Signed)
compliant

## 2014-03-12 ENCOUNTER — Encounter: Payer: Self-pay | Admitting: Internal Medicine

## 2014-03-16 ENCOUNTER — Ambulatory Visit (INDEPENDENT_AMBULATORY_CARE_PROVIDER_SITE_OTHER): Payer: BC Managed Care – PPO | Admitting: Nurse Practitioner

## 2014-03-16 ENCOUNTER — Encounter: Payer: Self-pay | Admitting: Nurse Practitioner

## 2014-03-16 VITALS — BP 104/72 | HR 88 | Ht 62.5 in | Wt 182.0 lb

## 2014-03-16 DIAGNOSIS — N926 Irregular menstruation, unspecified: Secondary | ICD-10-CM

## 2014-03-16 DIAGNOSIS — Z0000 Encounter for general adult medical examination without abnormal findings: Secondary | ICD-10-CM

## 2014-03-16 DIAGNOSIS — Z01419 Encounter for gynecological examination (general) (routine) without abnormal findings: Secondary | ICD-10-CM

## 2014-03-16 DIAGNOSIS — R350 Frequency of micturition: Secondary | ICD-10-CM

## 2014-03-16 LAB — POCT URINALYSIS DIPSTICK
Bilirubin, UA: NEGATIVE
Glucose, UA: NEGATIVE
Ketones, UA: NEGATIVE
NITRITE UA: NEGATIVE
PH UA: 5
Urobilinogen, UA: NEGATIVE

## 2014-03-16 LAB — HEMOGLOBIN, FINGERSTICK: Hemoglobin, fingerstick: 12.7 g/dL (ref 12.0–16.0)

## 2014-03-16 LAB — POCT URINE PREGNANCY: PREG TEST UR: NEGATIVE

## 2014-03-16 NOTE — Patient Instructions (Signed)

## 2014-03-16 NOTE — Progress Notes (Signed)
Patient ID: Destiny Sharp, female   DOB: 02-Dec-1970, 43 y.o.   MRN: [id number] 43 y.o. G0 Single Caucasian Fe here for annual exam.  PMP 5/2 and was normal.  Around first of June she had all the PMS symptoms but no menses except for very little spotting for 2 hours.  Then menses in July started on the 2nd and has continued.  Very heavy , increase cramps, boating, increase in HA's and PMS.  She is still bleeding CD # 9 but much lighter.  Not SA ever.  She has been treated for a lot of respiratory issues this past year relating to work and exposed to mold.  She has another problem of urinary frequency and incontinence that over the past several years has gotten worse.  She is scheduled for urology consult next week. 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  Just worse now.  Patient's last menstrual period was 03/08/2014.          Sexually active: No.  The current method of family planning is abstinence.    Exercising: Yes.    Home exercise routine includes walking and housework. Smoker:  no  Health Maintenance: Pap:  09/22/10, negative  MMG:  02/05/14, Bi-Rads 1: negative  TDaP:   UTD with PCP Labs:  HB:  12.7 Urine:  Large RBC, trace protein, trace leuk's   UPT: neg   reports that she has never smoked. She has never used smokeless tobacco. She reports that she does not drink alcohol or use illicit drugs.  Past Medical History  Diagnosis Date  . Hypothyroidism   . Edema   . Hyperlipidemia   . Pneumonia   . Vasovagal syncope   . Sleep apnea   . Fibromyalgia   . Scoliosis   . Hx of cardiovascular stress test 11/22/12    Lex MV 2/14: EF 81%, no ischemia    Past Surgical History  Procedure Laterality Date  . Appendectomy    . Nasal sinus surgery    . Mouth surgery      Current Outpatient Prescriptions  Medication Sig Dispense Refill  . albuterol (PROVENTIL HFA;VENTOLIN HFA) 108 (90 BASE) MCG/ACT  inhaler Inhale 2 puffs into the lungs every 4 (four) hours as needed for wheezing or shortness of breath.  1 Inhaler  prn  . calcium carbonate (OS-CAL - DOSED IN MG OF ELEMENTAL CALCIUM) 1250 MG tablet Take 1 tablet by mouth daily.      . fluticasone (FLONASE) 50 MCG/ACT nasal spray Place 1 spray into both nostrils daily.      Marland Kitchen ipratropium-albuterol (DUONEB) 0.5-2.5 (3) MG/3ML SOLN Take 3 mLs by nebulization every 6 (six) hours as needed.  360 mL  prn  . levothyroxine (SYNTHROID, LEVOTHROID) 50 MCG tablet Take 50 mcg by mouth daily before breakfast.       . mometasone-formoterol (DULERA) 200-5 MCG/ACT AERO Inhale 2 puffs then rinse mouth, twice daily- maintenance  1 Inhaler  prn  . montelukast (SINGULAIR) 10 MG tablet Take 1 tablet (10 mg total) by mouth at bedtime.  30 tablet  11  . Multiple Vitamin (MULTIVITAMIN WITH MINERALS) TABS Take 1 tablet by mouth every morning.       . Nebulizers (COMPRESSOR/NEBULIZER) MISC Use as directed for asthma  1 each  prn  . Omega-3 Fatty Acids (FISH OIL PO) Take 2,400 mg by mouth daily.      Marland Kitchen  OVER THE COUNTER MEDICATION Place 1 application onto the skin as needed. Essential oil therapy Rosemary, Eucaliptus, Stress Away      . Spacer/Aero-Holding Chambers ([place]) MISC       . vitamin B-12 (CYANOCOBALAMIN) 1000 MCG tablet Take 1,000 mcg by mouth every morning.       . vitamin C (ASCORBIC ACID) 500 MG tablet Take 1,000 mg by mouth every morning.        No current facility-administered medications for this visit.    Family History  Problem Relation Age of Onset  . CAD Maternal Grandmother   . Ovarian cancer Mother 33    ovarian  . Emphysema Maternal Uncle   . Heart disease Maternal Grandfather   . Heart disease Paternal Grandfather   . Heart disease Paternal Grandmother   . Heart disease Father     ROS:  Pertinent items are noted in HPI.  Otherwise, a comprehensive ROS was negative.  Exam:   BP 104/72  Pulse 88  Ht 5' 2.5" (1.588 m)   Wt 182 lb (82.555 kg)  BMI 32.74 kg/m2  LMP 03/08/2014 Height: 5' 2.5" (158.8 cm)  Ht Readings from Last 3 Encounters:  03/16/14 5' 2.5" (1.588 m)  03/07/14 5' 2.5" (1.588 m)  11/27/13 5' 2.5" (1.588 m)    General appearance: alert, cooperative and appears stated age Head: Normocephalic, without obvious abnormality, atraumatic Neck: no adenopathy, supple, symmetrical, trachea midline and thyroid normal to inspection and palpation Lungs: clear to auscultation bilaterally Breasts: normal appearance, no masses or tenderness Heart: regular rate and rhythm Abdomen: soft, non-tender; no masses,  no organomegaly Extremities: extremities normal, atraumatic, no cyanosis or edema Skin: Skin color, texture, turgor normal. No rashes or lesions Lymph nodes: Cervical, supraclavicular, and axillary nodes normal. No abnormal inguinal nodes palpated Neurologic: Grossly normal   Pelvic: External genitalia:  no lesions              Urethra:  normal appearing urethra with no masses, tenderness or lesions              Bartholin's and Skene's: normal                 Vagina: normal appearing vagina with normal color and discharge, no lesions              Cervix: anteverted              Pap taken: Yes.   Bimanual Exam:  Uterus:  normal size, contour, position, consistency, mobility, non-tender              Adnexa: no mass, fullness, tenderness               Rectovaginal: Confirms               Anus:  normal sphincter tone, no lesions  A:  Well Woman with normal exam  Never SA  History of irregular menses - peri menopausal vs. recent treatment of URI  History of fall Jan 2006 - increase of urinary incontinence  Maybe related if neurogenic bladder.  Will check for infection as well.  P:   Reviewed health and wellness pertinent to exam  Pap smear taken today  Follow with urine C&S  She will call back on Monday if menses continues - if just spotting will not do Provera but otherwise will do Provera  for 10-14 days to coincide with next menses.  UPT already done today.  Counseled on breast self exam,  mammography screening, adequate intake of calcium and vitamin D, diet and exercise, Kegel's exercises return annually or prn  An After Visit Summary was printed and given to the patient.

## 2014-03-17 LAB — URINE CULTURE

## 2014-03-19 NOTE — Progress Notes (Signed)
Note reviewed, agree with plan.  Anayely Constantine, MD  

## 2014-03-21 LAB — IPS PAP TEST WITH HPV

## 2014-04-20 ENCOUNTER — Telehealth: Payer: Self-pay | Admitting: Nurse Practitioner

## 2014-04-20 DIAGNOSIS — N921 Excessive and frequent menstruation with irregular cycle: Secondary | ICD-10-CM

## 2014-04-20 MED ORDER — MEDROXYPROGESTERONE ACETATE 10 MG PO TABS: 10.0000 mg | ORAL_TABLET | Freq: Every day | ORAL | Status: DC

## 2014-04-20 NOTE — Telephone Encounter (Signed)
Patient calling to speak with nurse re: "extended periods of very heavy bleeding."

## 2014-04-20 NOTE — Telephone Encounter (Signed)
Spoke with patient. Advised of message as seen below from [place]. Patient agreeable and verbalizes understanding. Patient states "Is there not any way I could just have an ultrasound and not the sonohysterogram?" Advised patient that we are preparing for everything but may not need to do it all. Patient requesting to only have an ultrasound at this time. Advised patient could get her scheduled and let Dr.Silva know of her preference. Patient agreeable. Advised patient to take 800mg  of motrin/ibuprofen one hour before appointment to reduce any abdominal cramping. Patient agreeable. Appointment scheduled for next Thursday 8/20 at 9:30am with 10am consult with Dr.Silva. Patient agreeable to date and time.  Patient verbalized understanding of the [facility] appointment cancellation policy. Advised will need to cancel within 72 business hours (3 business days) or will have $100.00 no show fee placed to account. Advised patient would send over rx for Provera to pharmacy on file. Patient agreeable.   Routing to Dr.Silva as covering Cc: Milford Cage, FNP   Routing to provider for final review. Patient agreeable to disposition. Will close encounter

## 2014-04-20 NOTE — Telephone Encounter (Signed)
Will treat with Provera 10 mg po q day for 10 days.  Will schedule a sonohysterogram and possible endometrial biopsy.    This will also evaluate the ovaries.

## 2014-04-20 NOTE — Telephone Encounter (Signed)
Spoke with patient. Patient states that she went two months without a cycle and then had "an extremely heavy one" before seeing Milford Cage, FNP for her annual exam. Patient has now started another cycle on 8/2 that she states is "extremely heavy again with a ton of clots." Patient denies any pain or fevers. Patient is wearing a pad and tampon that she is changing every 3 hours. "Patty mentioned starting me on some medication. I really don't want to be on birth control but if I need to I will." Per office visit note from Milford Cage, FNP possible start on Provera. "This is unreal. I was scared yesterday because I was messing everything up. My mom died of ovarian cancer so that has me worried." Advised patient would speak with covering provider as Milford Cage, FNP is out of the office today and would give patient a call back with further recommendations and instructions. Patient agreeable.  Routing to Dr.Silva Cc: Milford Cage, FNP

## 2014-04-23 ENCOUNTER — Telehealth: Payer: Self-pay

## 2014-04-23 NOTE — Telephone Encounter (Signed)
Encounter opened in error

## 2014-04-24 ENCOUNTER — Ambulatory Visit: Payer: BC Managed Care – PPO | Admitting: Internal Medicine

## 2014-04-26 ENCOUNTER — Ambulatory Visit (INDEPENDENT_AMBULATORY_CARE_PROVIDER_SITE_OTHER): Payer: BC Managed Care – PPO

## 2014-04-26 ENCOUNTER — Encounter: Payer: Self-pay | Admitting: Obstetrics and Gynecology

## 2014-04-26 ENCOUNTER — Ambulatory Visit (INDEPENDENT_AMBULATORY_CARE_PROVIDER_SITE_OTHER): Payer: BC Managed Care – PPO | Admitting: Obstetrics and Gynecology

## 2014-04-26 VITALS — BP 130/70 | Ht 62.5 in | Wt 184.0 lb

## 2014-04-26 DIAGNOSIS — N921 Excessive and frequent menstruation with irregular cycle: Secondary | ICD-10-CM

## 2014-04-26 DIAGNOSIS — N920 Excessive and frequent menstruation with regular cycle: Secondary | ICD-10-CM

## 2014-04-26 DIAGNOSIS — R9389 Abnormal findings on diagnostic imaging of other specified body structures: Secondary | ICD-10-CM

## 2014-04-26 MED ORDER — ALPRAZOLAM 0.25 MG PO TABS: ORAL_TABLET | ORAL | Status: DC

## 2014-04-26 MED ORDER — MISOPROSTOL 200 MCG PO TABS: ORAL_TABLET | ORAL | Status: DC

## 2014-04-26 NOTE — Patient Instructions (Addendum)
Endometrial Biopsy Endometrial biopsy is a procedure in which a tissue sample is taken from inside the uterus. The tissue sample is then looked at under a microscope to see if the tissue is normal or abnormal. The endometrium is the lining of the uterus. This procedure helps determine where you are in your menstrual cycle and how hormone levels are affecting the lining of the uterus. This procedure may also be used to evaluate uterine bleeding or to diagnose endometrial cancer, tuberculosis, polyps, or inflammatory conditions.  LET [facility] CARE PROVIDER KNOW ABOUT:  Any allergies you have.  All medicines you are taking, including vitamins, herbs, eye drops, creams, and over-the-counter medicines.  Previous problems you or members of your family have had with the use of anesthetics.  Any blood disorders you have.  Previous surgeries you have had.  Medical conditions you have.  Possibility of pregnancy. RISKS AND COMPLICATIONS Generally, this is a safe procedure. However, as with any procedure, complications can occur. Possible complications include:  Bleeding.  Pelvic infection.  Puncture of the uterine wall with the biopsy device (rare). BEFORE THE PROCEDURE   Keep a record of your menstrual cycles as directed by your health care provider. You may need to schedule your procedure for a specific time in your cycle.  You may want to bring a sanitary pad to wear home after the procedure.  Arrange for someone to drive you home after the procedure if you will be given a medicine to help you relax (sedative). PROCEDURE   You may be given a sedative to relax you.  You will lie on an exam table with your feet and legs supported as in a pelvic exam.  Your health care provider will insert an instrument (speculum) into your vagina to see your cervix.  Your cervix will be cleansed with an antiseptic solution. A medicine (local anesthetic) will be used to numb the cervix.  A forceps  instrument (tenaculum) will be used to hold your cervix steady for the biopsy.  A thin, rodlike instrument (uterine sound) will be inserted through your cervix to determine the length of your uterus and the location where the biopsy sample will be removed.  A thin, flexible tube (catheter) will be inserted through your cervix and into the uterus. The catheter is used to collect the biopsy sample from your endometrial tissue.  The catheter and speculum will then be removed, and the tissue sample will be sent to a lab for examination. AFTER THE PROCEDURE  You will rest in a recovery area until you are ready to go home.  You may have mild cramping and a small amount of vaginal bleeding for a few days after the procedure. This is normal.  Make sure you find out how to get your test results. Document Released: 12/25/2004 Document Revised: 04/26/2013 Document Reviewed: 02/08/2013 [facility] Patient Information 2015 [place], [place]. This information is not intended to replace advice given to you by your health care provider. Make sure you discuss any questions you have with your health care provider.  Please refer to your handouts on hysterectomy and dilation and curettage.

## 2014-04-26 NOTE — Progress Notes (Signed)
GYNECOLOGY  VISIT   HPI: 43 y.o.   Single  Caucasian  female   G0P0 with Patient's last menstrual period was 04/08/2014.   here for   [place] for irregular bleeding. Menses prior to April were normal. Menses April and May heavy and lasting 7 - 8 days.  June no menses. July - heavy for 15 - 20 days. August - bled from August 2 - today and has been clotting.  Started Provera one week ago.  Hgb 12.7 on 03/16/14.  Some early satiety. Weight stable.   Having urinary control problems after having a hard fall.  Seeing urology. Took Vesicare and Lisbeth Ply, but is currently off these.   History of laparotomy for ruptured appendix in 2002.  Had pneumonia following this and was hospitalized for several weeks.   States she has respiratory problems at least once a year with bronchitis or pneumonia.  Patient states she has some difficulty with procedures.  Becomes nervous.  Fainted after she had allergy testing done.  Does not like needles.   Mother died from primary peritoneal tumor. Patient is worried about cancer in herself, and is inclined to have hysterectomy with removal of tubes and ovaries.    GYNECOLOGIC HISTORY: Patient's last menstrual period was 04/08/2014. Contraception:   None Menopausal hormone therapy: None        OB History   Grav Para Term Preterm Abortions TAB SAB Ect Mult Living   0                  Patient Active Problem List   Diagnosis Date Noted  . Vasovagal attack 03/07/2014  . Obstructive sleep apnea 01/17/2013  . Abnormal CT of the chest 10/30/2012  . Chest pain 10/07/2012  . Hypothyroidism   . Hypertension   . SOB (shortness of breath)   . Fatigue   . Edema     Past Medical History  Diagnosis Date  . Hypothyroidism   . Edema   . Hyperlipidemia   . Pneumonia   . Vasovagal syncope   . Sleep apnea   . Fibromyalgia   . Scoliosis   . Hx of cardiovascular stress test 11/22/12    Lex MV 2/14: EF 81%, no ischemia    Past Surgical History  Procedure  Laterality Date  . Appendectomy    . Nasal sinus surgery    . Mouth surgery      Current Outpatient Prescriptions  Medication Sig Dispense Refill  . albuterol (PROVENTIL HFA;VENTOLIN HFA) 108 (90 BASE) MCG/ACT inhaler Inhale 2 puffs into the lungs every 4 (four) hours as needed for wheezing or shortness of breath.  1 Inhaler  prn  . calcium carbonate (OS-CAL - DOSED IN MG OF ELEMENTAL CALCIUM) 1250 MG tablet Take 1 tablet by mouth daily.      . fluticasone (FLONASE) 50 MCG/ACT nasal spray Place 1 spray into both nostrils daily.      Marland Kitchen ipratropium-albuterol (DUONEB) 0.5-2.5 (3) MG/3ML SOLN Take 3 mLs by nebulization every 6 (six) hours as needed.  360 mL  prn  . levothyroxine (SYNTHROID, LEVOTHROID) 50 MCG tablet Take 50 mcg by mouth daily before breakfast.       . medroxyPROGESTERone (PROVERA) 10 MG tablet Take 1 tablet (10 mg total) by mouth daily.  10 tablet  0  . mometasone-formoterol (DULERA) 200-5 MCG/ACT AERO Inhale 2 puffs then rinse mouth, twice daily- maintenance  1 Inhaler  prn  . montelukast (SINGULAIR) 10 MG tablet Take 1 tablet (10 mg total)  by mouth at bedtime.  30 tablet  11  . Multiple Vitamin (MULTIVITAMIN WITH MINERALS) TABS Take 1 tablet by mouth every morning.       . Nebulizers (COMPRESSOR/NEBULIZER) MISC Use as directed for asthma  1 each  prn  . Omega-3 Fatty Acids (FISH OIL PO) Take 2,400 mg by mouth daily.      Marland Kitchen OVER THE COUNTER MEDICATION Place 1 application onto the skin as needed. Essential oil therapy Rosemary, Eucaliptus, Stress Away      . Spacer/Aero-Holding Chambers ([place]) MISC       . vitamin B-12 (CYANOCOBALAMIN) 1000 MCG tablet Take 1,000 mcg by mouth every morning.       . vitamin C (ASCORBIC ACID) 500 MG tablet Take 1,000 mg by mouth every morning.        No current facility-administered medications for this visit.     ALLERGIES: Codeine; Hepatitis b virus vaccine; Imitrex; and Pneumococcal vaccines  Family History  Problem Relation  Age of Onset  . CAD Maternal Grandmother   . Ovarian cancer Mother 28    ovarian  . Emphysema Maternal Uncle   . Heart disease Maternal Grandfather   . Heart disease Paternal Grandfather   . Heart disease Paternal Grandmother   . Heart disease Father     History   Social History  . Marital Status: Single    Spouse Name: N/A    Number of Children: 0  . Years of Education: N/A   Occupational History  .  [employer]   Social History Main Topics  . Smoking status: Never Smoker   . Smokeless tobacco: Never Used  . Alcohol Use: No  . Drug Use: No  . Sexual Activity: No   Other Topics Concern  . Not on file   Social History Narrative  . No narrative on file    ROS:  Pertinent items are noted in HPI.  PHYSICAL EXAMINATION:    BP 130/70  Ht 5' 2.5" (1.588 m)  Wt 184 lb (83.462 kg)  BMI 33.10 kg/m2  LMP 04/08/2014     General appearance: alert, cooperative and appears stated age Abdomen:  Vertical upper midline incision.  Pelvic: External genitalia:  no lesions              Urethra:  normal appearing urethra with no masses, tenderness or lesions              Bartholins and Skenes: normal                 Vagina: normal appearing vagina with normal color and discharge, no lesions              Cervix: normal appearance.  Small amount of brown mucous noted.                    Bimanual Exam:  Uterus:  uterus is normal size, shape, consistency and nontender                                      Adnexa: normal adnexa in size, nontender and no masses                                      ASSESSMENT  Nulliparous female. Menometrorrhagia.  Thickened endometrium.  Small uterine fibroid. Family history of primary peritoneal tumor.  PLAN  Return for endometrial biopsy.  Procedure explained. Patient signed consent today. Will give cytotec. Xanax prior to procedure.  I have already discussed the probability of at least a hysteroscopy with  dilation and curettage and possible hysterectomy with bilateral salpingo-oophorectomy.  ACOG handouts on the above to the patient.  25 minutes face to face time of which over 50% was spent in counseling.   After visit summary to patient.

## 2014-04-26 NOTE — Progress Notes (Signed)
Pelvic ultrasound - images and reports reviewed with patient Uterus with 15.84 endometrial thickness and 33 x 16 mm echogenic focus. 17 mm anterior fundal fibroid.  Normal ovaries.  No free fluid.

## 2014-04-30 ENCOUNTER — Telehealth: Payer: Self-pay | Admitting: Obstetrics and Gynecology

## 2014-04-30 ENCOUNTER — Encounter: Payer: Self-pay | Admitting: Obstetrics and Gynecology

## 2014-04-30 DIAGNOSIS — N921 Excessive and frequent menstruation with irregular cycle: Secondary | ICD-10-CM

## 2014-04-30 NOTE — Telephone Encounter (Signed)
Patient called. Scheduled EMB w/Dr Quincy Simmonds for this week. Advised patient of $30 copay

## 2014-05-02 ENCOUNTER — Ambulatory Visit (INDEPENDENT_AMBULATORY_CARE_PROVIDER_SITE_OTHER): Payer: BC Managed Care – PPO | Admitting: Obstetrics and Gynecology

## 2014-05-02 ENCOUNTER — Encounter: Payer: Self-pay | Admitting: Obstetrics and Gynecology

## 2014-05-02 VITALS — BP 104/64 | HR 96 | Temp 98.2°F | Resp 18 | Ht 62.5 in | Wt 182.0 lb

## 2014-05-02 DIAGNOSIS — N920 Excessive and frequent menstruation with regular cycle: Secondary | ICD-10-CM

## 2014-05-02 DIAGNOSIS — N921 Excessive and frequent menstruation with irregular cycle: Secondary | ICD-10-CM

## 2014-05-02 LAB — HEMOGLOBIN, FINGERSTICK: Hemoglobin, fingerstick: 13.5 g/dL (ref 12.0–16.0)

## 2014-05-02 MED ORDER — MEGESTROL ACETATE 20 MG PO TABS: 20.0000 mg | ORAL_TABLET | Freq: Two times a day (BID) | ORAL | Status: DC

## 2014-05-02 NOTE — Progress Notes (Signed)
Pulmonologist - Dr. Keturah Barre.  GYNECOLOGY  VISIT   HPI: 43 y.o.   Single  Caucasian  female   G0P0 with Patient's last menstrual period was 04/08/2014.   here for  EMB for thickened endometrium and prolonged and heavy bleeding. Provera has not helped bleeding.  Wearing Depends. Patient is very interested to do hysterectomy.  Mother had primary peritoneal tumor.   Patient pretreated with Cytotec, Motrin, and Xanax today.  Consent for endometrial biopsy signed at last office visit.  Patient's father is here today to drive patient back home.   Hgb 13. 5 today.   GYNECOLOGIC HISTORY: Patient's last menstrual period was 04/08/2014. Contraception: None    Menopausal hormone therapy: None        OB History   Grav Para Term Preterm Abortions TAB SAB Ect Mult Living   0                  Patient Active Problem List   Diagnosis Date Noted  . Vasovagal attack 03/07/2014  . Obstructive sleep apnea 01/17/2013  . Abnormal CT of the chest 10/30/2012  . Chest pain 10/07/2012  . Hypothyroidism   . Hypertension   . SOB (shortness of breath)   . Fatigue   . Edema     Past Medical History  Diagnosis Date  . Hypothyroidism   . Edema   . Hyperlipidemia   . Pneumonia   . Vasovagal syncope   . Sleep apnea   . Fibromyalgia   . Scoliosis   . Hx of cardiovascular stress test 11/22/12    Lex MV 2/14: EF 81%, no ischemia    Past Surgical History  Procedure Laterality Date  . Appendectomy    . Nasal sinus surgery    . Mouth surgery      Current Outpatient Prescriptions  Medication Sig Dispense Refill  . albuterol (PROVENTIL HFA;VENTOLIN HFA) 108 (90 BASE) MCG/ACT inhaler Inhale 2 puffs into the lungs every 4 (four) hours as needed for wheezing or shortness of breath.  1 Inhaler  prn  . ALPRAZolam (XANAX) 0.25 MG tablet Take one tablet 1/2 hour prior to procedure.  5 tablet  0  . calcium carbonate (OS-CAL - DOSED IN MG OF ELEMENTAL CALCIUM) 1250 MG tablet Take 1 tablet by  mouth daily.      . fluticasone (FLONASE) 50 MCG/ACT nasal spray Place 1 spray into both nostrils daily.      Marland Kitchen ipratropium-albuterol (DUONEB) 0.5-2.5 (3) MG/3ML SOLN Take 3 mLs by nebulization every 6 (six) hours as needed.  360 mL  prn  . levothyroxine (SYNTHROID, LEVOTHROID) 50 MCG tablet Take 50 mcg by mouth daily before breakfast.       . medroxyPROGESTERone (PROVERA) 10 MG tablet Take 1 tablet (10 mg total) by mouth daily.  10 tablet  0  . misoprostol (CYTOTEC) 200 MCG tablet Take one tablet by mouth the evening prior to the procedure and then one table by mouth the morning of the procedure.  2 tablet  0  . mometasone-formoterol (DULERA) 200-5 MCG/ACT AERO Inhale 2 puffs then rinse mouth, twice daily- maintenance  1 Inhaler  prn  . montelukast (SINGULAIR) 10 MG tablet Take 1 tablet (10 mg total) by mouth at bedtime.  30 tablet  11  . Multiple Vitamin (MULTIVITAMIN WITH MINERALS) TABS Take 1 tablet by mouth every morning.       . Nebulizers (COMPRESSOR/NEBULIZER) MISC Use as directed for asthma  1 each  prn  . Omega-3 Fatty  Acids (FISH OIL PO) Take 2,400 mg by mouth daily.      Marland Kitchen OVER THE COUNTER MEDICATION Place 1 application onto the skin as needed. Essential oil therapy Rosemary, Eucaliptus, Stress Away      . Spacer/Aero-Holding Chambers ([place]) MISC       . vitamin B-12 (CYANOCOBALAMIN) 1000 MCG tablet Take 1,000 mcg by mouth every morning.       . vitamin C (ASCORBIC ACID) 500 MG tablet Take 1,000 mg by mouth every morning.        No current facility-administered medications for this visit.     ALLERGIES: Codeine; Hepatitis b virus vaccine; Imitrex; and Pneumococcal vaccines  Family History  Problem Relation Age of Onset  . CAD Maternal Grandmother   . Ovarian cancer Mother 8    ovarian  . Emphysema Maternal Uncle   . Heart disease Maternal Grandfather   . Heart disease Paternal Grandfather   . Heart disease Paternal Grandmother   . Heart disease Father      History   Social History  . Marital Status: Single    Spouse Name: N/A    Number of Children: 0  . Years of Education: N/A   Occupational History  .  [employer]   Social History Main Topics  . Smoking status: Never Smoker   . Smokeless tobacco: Never Used  . Alcohol Use: No  . Drug Use: No  . Sexual Activity: No   Other Topics Concern  . Not on file   Social History Narrative  . No narrative on file    ROS:  Pertinent items are noted in HPI.  PHYSICAL EXAMINATION:    BP 104/64  Pulse 96  Temp(Src) 98.2 F (36.8 C) (Oral)  Resp 18  Ht 5' 2.5" (1.588 m)  Wt 182 lb (82.555 kg)  BMI 32.74 kg/m2  LMP 04/08/2014     General appearance: alert, cooperative and appears stated age   Procedure - endometrial biopsy. Consent already signed.  Speculum placed in vagina.  Sterile prep with Hibiclens.  Paracervical block with 10 cc 1% lidocaine - Lot number [id number], Expiration 02/06/15. Pipelle placed to 7 cm three times.  Tissue placed in formalin.  Tissue to [facility] - will be sent as a rush tomorrow am.  No complications.  EBL 20 cc from injection of paracervical block.  BP 122/80, P 82 after procedure.  Given Coke.                               ASSESSMENT  Menometrorrhagia. Thickened endometrium.  History of vertical midline incision for ruptured appendix. Family history of primary peritoneal tumor.   PLAN  Follow up endometrial biopsy. Will treat with Megace 20 mg po bid.  I discussed robotic hysterectomy briefly with patient and gave her a DVD to take home to watch.    An After Visit Summary was printed and given to the patient.

## 2014-05-02 NOTE — Patient Instructions (Signed)
Endometrial Biopsy, Care After Refer to this sheet in the next few weeks. These instructions provide you with information on caring for yourself after your procedure. Your health care provider may also give you more specific instructions. Your treatment has been planned according to current medical practices, but problems sometimes occur. Call your health care provider if you have any problems or questions after your procedure. WHAT TO EXPECT AFTER THE PROCEDURE After your procedure, it is typical to have the following:  You may have mild cramping and a small amount of vaginal bleeding for a few days after the procedure. This is normal. HOME CARE INSTRUCTIONS  Only take over-the-counter or prescription medicine as directed by your health care provider.  Do not douche, use tampons, or have sexual intercourse until your health care provider approves.  Follow your health care provider's instructions regarding any activity restrictions, such as strenuous exercise or heavy lifting. SEEK MEDICAL CARE IF:  You have heavy bleeding or bleeding longer than 2 days after the procedure.  You have bad smelling drainage from your vagina.  You have a fever and chills.  Youhave severe lower stomach (abdominal) pain. SEEK IMMEDIATE MEDICAL CARE IF:  You have severe cramps in your stomach or back.  You pass large blood clots.  Your bleeding increases.  You become weak or lightheaded, or you pass out. Document Released: 06/14/2013 Document Reviewed: 06/14/2013 ExitCare Patient Information 2015 [employer]. This information is not intended to replace advice given to you by your health care provider. Make sure you discuss any questions you have with your health care provider.  

## 2014-05-03 ENCOUNTER — Encounter: Payer: Self-pay | Admitting: Internal Medicine

## 2014-05-03 ENCOUNTER — Telehealth: Payer: Self-pay | Admitting: Internal Medicine

## 2014-05-03 NOTE — Addendum Note (Signed)
Addended by: Tacy Learn, BROOK E on: 05/03/2014 09:16 AM   Modules accepted: Orders

## 2014-05-03 NOTE — Telephone Encounter (Signed)
Called and spoke with pt and she stated that she is scheduled for hysterectomy by Dr. Quincy Simmonds on 05/22/2014.  She stated that CY will need to give her surgical clearance for this.  Pt was last seen 03/07/14.  CY please advise. Thanks   Allergies  Allergen Reactions  . Codeine Nausea And Vomiting  . Hepatitis B Virus Vaccine Itching  . Imitrex [Sumatriptan] Other (See Comments)    vomitting  . Pneumococcal Vaccines Itching    Current Outpatient Prescriptions on File Prior to Visit  Medication Sig Dispense Refill  . albuterol (PROVENTIL HFA;VENTOLIN HFA) 108 (90 BASE) MCG/ACT inhaler Inhale 2 puffs into the lungs every 4 (four) hours as needed for wheezing or shortness of breath.  1 Inhaler  prn  . ALPRAZolam (XANAX) 0.25 MG tablet Take one tablet 1/2 hour prior to procedure.  5 tablet  0  . calcium carbonate (OS-CAL - DOSED IN MG OF ELEMENTAL CALCIUM) 1250 MG tablet Take 1 tablet by mouth daily.      . fluticasone (FLONASE) 50 MCG/ACT nasal spray Place 1 spray into both nostrils daily.      Marland Kitchen ipratropium-albuterol (DUONEB) 0.5-2.5 (3) MG/3ML SOLN Take 3 mLs by nebulization every 6 (six) hours as needed.  360 mL  prn  . levothyroxine (SYNTHROID, LEVOTHROID) 50 MCG tablet Take 50 mcg by mouth daily before breakfast.       . medroxyPROGESTERone (PROVERA) 10 MG tablet Take 1 tablet (10 mg total) by mouth daily.  10 tablet  0  . megestrol (MEGACE) 20 MG tablet Take 1 tablet (20 mg total) by mouth 2 (two) times daily.  60 tablet  0  . misoprostol (CYTOTEC) 200 MCG tablet Take one tablet by mouth the evening prior to the procedure and then one table by mouth the morning of the procedure.  2 tablet  0  . mometasone-formoterol (DULERA) 200-5 MCG/ACT AERO Inhale 2 puffs then rinse mouth, twice daily- maintenance  1 Inhaler  prn  . montelukast (SINGULAIR) 10 MG tablet Take 1 tablet (10 mg total) by mouth at bedtime.  30 tablet  11  . Multiple Vitamin (MULTIVITAMIN WITH MINERALS) TABS Take 1 tablet by mouth  every morning.       . Nebulizers (COMPRESSOR/NEBULIZER) MISC Use as directed for asthma  1 each  prn  . Omega-3 Fatty Acids (FISH OIL PO) Take 2,400 mg by mouth daily.      Marland Kitchen OVER THE COUNTER MEDICATION Place 1 application onto the skin as needed. Essential oil therapy Rosemary, Eucaliptus, Stress Away      . Spacer/Aero-Holding Chambers ([place]) MISC       . vitamin B-12 (CYANOCOBALAMIN) 1000 MCG tablet Take 1,000 mcg by mouth every morning.       . vitamin C (ASCORBIC ACID) 500 MG tablet Take 1,000 mg by mouth every morning.        No current facility-administered medications on file prior to visit.

## 2014-05-03 NOTE — Telephone Encounter (Signed)
Spoke with patient. Advised that per benefit quote received, she will be responsible for $646.06 for the surgeon portion of her surgery. Advised that payment is due in full at least 2 weeks prior to the scheduled surgery date. Surgery is scheduled 09.15.2015 with payment due date 09.01.2015.Patient agreeable. Patient asked if I could provide any information regarding hospital costs. I explained that I could not, but that she can contact the hospital pre-services center at [phone] for additional information. Patient agreeable. Patient asked to speak with Gay Filler regarding scheduling. Passed call to [facility]

## 2014-05-04 ENCOUNTER — Telehealth: Payer: Self-pay | Admitting: Obstetrics and Gynecology

## 2014-05-04 ENCOUNTER — Telehealth: Payer: Self-pay | Admitting: Cardiology

## 2014-05-04 NOTE — Telephone Encounter (Signed)
Phone call to discuss endometrial biopsy results. Pathology is a benign polyp. No evidence of atypia and malignancy.   Bleeding is lessening.  On Megace.  Discussed dilation and curettage with polypectomy versus robotic total laparoscopic hysterectomy with bilateral salpingo-oophorectomy.  Wishes to proceed with hysterectomy.   Has contacted cardiology and awaiting a response.  Has also contacted her pulmonologist, who has sent correspondence regarding her respiratory status and use of CPAP.  Will proceed forward with scheduling of surgery and prop visit.

## 2014-05-04 NOTE — Telephone Encounter (Signed)
New message      Pt is having surgery in a couple of weeks----will Dr Martinique need to see her prior to surgery.  She is having a hysterectomy.

## 2014-05-04 NOTE — Telephone Encounter (Signed)
Done and sent to Dr Quincy Simmonds

## 2014-05-07 ENCOUNTER — Telehealth: Payer: Self-pay | Admitting: Obstetrics and Gynecology

## 2014-05-07 NOTE — Telephone Encounter (Signed)
Patient calling and requesting that she be scheduled for a pre-op consultation with Dr. Quincy Simmonds.  Patient unsure if she needed pre-op or consult.  Scheduled 30 minute appointment with Dr. Quincy Simmonds for 05/16/14 at 0900.  Patient agreeable.   cc Lamont Snowball, RN   Routing to provider for final review. Patient agreeable to disposition. Will close encounter

## 2014-05-07 NOTE — Telephone Encounter (Signed)
Returned call to patient Dr.Jordan advised ok to have surgery.Cardiac cath 11/22/12 normal.

## 2014-05-07 NOTE — Telephone Encounter (Signed)
Called, spoke with pt - Informed her letter was done and sent to Dr. Quincy Simmonds.  Per pt, she was advised by Dr. Geraldo Pitter office letter was received.

## 2014-05-07 NOTE — Telephone Encounter (Signed)
Pt calling to discuss surgery.

## 2014-05-09 NOTE — Addendum Note (Signed)
Addended by: Graylon Good on: 05/09/2014 10:46 AM   Modules accepted: Orders

## 2014-05-10 ENCOUNTER — Encounter (HOSPITAL_COMMUNITY): Payer: Self-pay | Admitting: Pharmacist

## 2014-05-15 ENCOUNTER — Telehealth: Payer: Self-pay | Admitting: *Deleted

## 2014-05-16 ENCOUNTER — Ambulatory Visit (INDEPENDENT_AMBULATORY_CARE_PROVIDER_SITE_OTHER): Payer: BC Managed Care – PPO | Admitting: Obstetrics and Gynecology

## 2014-05-16 ENCOUNTER — Encounter: Payer: Self-pay | Admitting: Obstetrics and Gynecology

## 2014-05-16 VITALS — BP 140/80 | HR 88 | Ht 62.5 in | Wt 183.8 lb

## 2014-05-16 DIAGNOSIS — N921 Excessive and frequent menstruation with irregular cycle: Secondary | ICD-10-CM

## 2014-05-16 DIAGNOSIS — N920 Excessive and frequent menstruation with regular cycle: Secondary | ICD-10-CM

## 2014-05-16 DIAGNOSIS — N840 Polyp of corpus uteri: Secondary | ICD-10-CM

## 2014-05-16 LAB — HEMOGLOBIN, FINGERSTICK: HEMOGLOBIN, FINGERSTICK: 12.8 g/dL (ref 12.0–16.0)

## 2014-05-16 NOTE — Progress Notes (Signed)
Patient ID: Destiny Sharp, female   DOB: Jan 13, 1971, 43 y.o.   MRN: [id number] GYNECOLOGY  VISIT   HPI: 43 y.o.   Single  Caucasian  female   G0P0 with Patient's last menstrual period was 04/08/2014.   here for surgical evaluation. Patient states still having vaginal bleeding even with Megace. Patient's cousin is present for the discussion and examination today.   Patient having heavy and persistent bleeding.  Is taking Megace 20 mg po bid.  Some uterine cramping.    Ultrasound 04/26/14 -  Uterus with 15.84 endometrial thickness and 33 x 16 mm echogenic focus.  17 mm anterior fundal fibroid.  Normal ovaries.  No free fluid.   EMB on 05/03/14 - benign endometrial polyp.  History of laparotomy for ruptured appendix in 2002. Had pneumonia following this and was hospitalized for several weeks.   History of elevated liver function studies.   Patient states she has pain in her right upper quadrant but no gall bladder disease is ever noted.  Has had evaluation in multiple health systems.  Normal CT in 2012.   States she has respiratory problems at least once a year with bronchitis or pneumonia.  Uses CPAP.   Patient is on Vesicare for neurologic problems with bladder.  Overactive bladder since a fall.   Mother died from primary peritoneal tumor.  Patient is BRCA negative - 2008.   Brother has done a directed blood donation for the patient.   GYNECOLOGIC HISTORY: Patient's last menstrual period was 04/08/2014. Contraception:  none  Sexually active:  no Menopausal hormone therapy: n/a Mammogram 02/05/14- negative.  Pap 03/16/14 - negative, HR HPV negative.   OB History   Grav Para Term Preterm Abortions TAB SAB Ect Mult Living   0                  Patient Active Problem List   Diagnosis Date Noted  . Vasovagal attack 03/07/2014  . Obstructive sleep apnea 01/17/2013  . Abnormal CT of the chest 10/30/2012  . Chest pain 10/07/2012  . Hypothyroidism   . Hypertension   .  SOB (shortness of breath)   . Fatigue   . Edema     Past Medical History  Diagnosis Date  . Hypothyroidism   . Edema   . Hyperlipidemia   . Pneumonia   . Vasovagal syncope   . Sleep apnea   . Fibromyalgia   . Scoliosis   . Hx of cardiovascular stress test 11/22/12    Lex MV 2/14: EF 81%, no ischemia    Past Surgical History  Procedure Laterality Date  . Appendectomy    . Nasal sinus surgery    . Mouth surgery      Current Outpatient Prescriptions  Medication Sig Dispense Refill  . albuterol (PROVENTIL HFA;VENTOLIN HFA) 108 (90 BASE) MCG/ACT inhaler Inhale 2 puffs into the lungs every 4 (four) hours as needed for wheezing or shortness of breath.  1 Inhaler  prn  . calcium carbonate (OS-CAL - DOSED IN MG OF ELEMENTAL CALCIUM) 1250 MG tablet Take 1 tablet by mouth daily.      . DENTA 5000 PLUS 1.1 % CREA dental cream       . fluticasone (FLONASE) 50 MCG/ACT nasal spray Place 1 spray into both nostrils daily.      Marland Kitchen ipratropium-albuterol (DUONEB) 0.5-2.5 (3) MG/3ML SOLN Take 3 mLs by nebulization every 6 (six) hours as needed.  360 mL  prn  . levothyroxine (SYNTHROID, LEVOTHROID) 50 MCG  tablet Take 50 mcg by mouth daily before breakfast.       . megestrol (MEGACE) 20 MG tablet Take 1 tablet (20 mg total) by mouth 2 (two) times daily.  60 tablet  0  . mometasone-formoterol (DULERA) 200-5 MCG/ACT AERO Inhale 2 puffs then rinse mouth, twice daily- maintenance  1 Inhaler  prn  . montelukast (SINGULAIR) 10 MG tablet Take 1 tablet (10 mg total) by mouth at bedtime.  30 tablet  11  . Multiple Vitamin (MULTIVITAMIN WITH MINERALS) TABS Take 1 tablet by mouth every morning.       . Nebulizers (COMPRESSOR/NEBULIZER) MISC Use as directed for asthma  1 each  prn  . Omega-3 Fatty Acids (FISH OIL PO) Take 2,400 mg by mouth daily.      Marland Kitchen OVER THE COUNTER MEDICATION Place 1 application onto the skin as needed. Essential oil therapy Rosemary, Eucaliptus, Stress Away      . Spacer/Aero-Holding  Chambers ([place]) MISC       . vitamin B-12 (CYANOCOBALAMIN) 1000 MCG tablet Take 1,000 mcg by mouth every morning.       . vitamin C (ASCORBIC ACID) 500 MG tablet Take 1,000 mg by mouth every morning.        No current facility-administered medications for this visit.     ALLERGIES: Codeine; Hepatitis b virus vaccine; Imitrex; and Pneumococcal vaccines  Family History  Problem Relation Age of Onset  . CAD Maternal Grandmother   . Ovarian cancer Mother 17    ovarian  . Emphysema Maternal Uncle   . Heart disease Maternal Grandfather   . Heart disease Paternal Grandfather   . Heart disease Paternal Grandmother   . Heart disease Father     History   Social History  . Marital Status: Single    Spouse Name: N/A    Number of Children: 0  . Years of Education: N/A   Occupational History  .  [employer]   Social History Main Topics  . Smoking status: Never Smoker   . Smokeless tobacco: Never Used  . Alcohol Use: No  . Drug Use: No  . Sexual Activity: No   Other Topics Concern  . Not on file   Social History Narrative  . No narrative on file    ROS:  Pertinent items are noted in HPI.  PHYSICAL EXAMINATION:    BP 140/80  Pulse 88  Ht 5' 2.5" (1.588 m)  Wt 183 lb 12.8 oz (83.371 kg)  BMI 33.06 kg/m2  LMP 04/08/2014     General appearance: alert, cooperative and appears stated age Lungs: clear to auscultation bilaterally Heart: regular rate and rhythm Abdomen: vertical midline incision, soft, non-tender; no masses,  no organomegaly No abnormal inguinal nodes palpated  Pelvic deferred.  Just done with EMB.   ASSESSMENT  Menorrhagia with metrorrhagia. Endometrial polyp. Unresponsive to Megace.  Family history of primary peritoneal tumor.  History of ruptured appendix.   PLAN  Check hgb today:  12.8 Stop Megace.   Counseled on robotic total laparoscopic hysterectomy with bilateral salpingo-oophorectomy and  cystoscopy.  Risks and benefits and alternatives discussed with the patient who wishes to proceed. Risks include but are not limited to bleeding, infection, damage to surrounding organs, reactions to anesthesia, pneumonia, DVT, PE, death, neuropathy, need for laparotomy, need for reoperation, and menopausal symptoms requiring estrogen therapy,   Return post op for 1 week visit.    An After Visit Summary was printed  and given to the patient.  _25_____ minutes face to face time of which over 50% was spent in counseling.

## 2014-05-16 NOTE — Patient Instructions (Signed)
Take the magnesium citrate starting at about 8 am the morning prior to surgery!  I will see you on next Tuesday.

## 2014-05-17 ENCOUNTER — Encounter (HOSPITAL_COMMUNITY): Payer: Self-pay

## 2014-05-17 ENCOUNTER — Encounter (HOSPITAL_COMMUNITY)
Admission: RE | Admit: 2014-05-17 | Discharge: 2014-05-17 | Disposition: A | Discharge status: Home / Self Care | Payer: BC Managed Care – PPO | Source: Ambulatory Visit | Attending: Obstetrics and Gynecology | Admitting: Obstetrics and Gynecology

## 2014-05-17 DIAGNOSIS — Z01812 Encounter for preprocedural laboratory examination: Secondary | ICD-10-CM | POA: Diagnosis present

## 2014-05-17 DIAGNOSIS — N840 Polyp of corpus uteri: Secondary | ICD-10-CM | POA: Insufficient documentation

## 2014-05-17 DIAGNOSIS — N920 Excessive and frequent menstruation with regular cycle: Secondary | ICD-10-CM | POA: Diagnosis not present

## 2014-05-17 DIAGNOSIS — D259 Leiomyoma of uterus, unspecified: Secondary | ICD-10-CM | POA: Diagnosis not present

## 2014-05-17 HISTORY — DX: Bronchitis, not specified as acute or chronic: J40

## 2014-05-17 HISTORY — DX: Unspecified asthma, uncomplicated: J45.909

## 2014-05-17 HISTORY — DX: Nausea with vomiting, unspecified: R11.2

## 2014-05-17 HISTORY — DX: Anxiety disorder, unspecified: F41.9

## 2014-05-17 HISTORY — DX: Other specified postprocedural states: Z98.890

## 2014-05-17 HISTORY — DX: Headache: R51

## 2014-05-17 LAB — CBC
HCT: 34.6 % — ABNORMAL LOW (ref 36.0–46.0)
Hemoglobin: 12 g/dL (ref 12.0–15.0)
MCH: 32.1 pg (ref 26.0–34.0)
MCHC: 34.7 g/dL (ref 30.0–36.0)
MCV: 92.5 fL (ref 78.0–100.0)
Platelets: 232 10*3/uL (ref 150–400)
RBC: 3.74 MIL/uL — AB (ref 3.87–5.11)
RDW: 13.2 % (ref 11.5–15.5)
WBC: 5 10*3/uL (ref 4.0–10.5)

## 2014-05-17 LAB — BASIC METABOLIC PANEL
ANION GAP: 13 (ref 5–15)
BUN: 16 mg/dL (ref 6–23)
CHLORIDE: 106 meq/L (ref 96–112)
CO2: 22 meq/L (ref 19–32)
Calcium: 9.5 mg/dL (ref 8.4–10.5)
Creatinine, Ser: 0.67 mg/dL (ref 0.50–1.10)
GFR calc non Af Amer: >90 mL/min (ref >90)
Glucose, Bld: 124 mg/dL — ABNORMAL HIGH (ref 70–99)
POTASSIUM: 4.4 meq/L (ref 3.7–5.3)
SODIUM: 141 meq/L (ref 137–147)

## 2014-05-17 NOTE — Patient Instructions (Addendum)
   Your procedure is scheduled on:  Tuesday, Sept 15  Enter through the [employer] [facility] at:  6 AM Pick up the phone at the desk and dial [phone] and inform us of your arrival.  Please call this number if you have any problems the morning of surgery: [phone]  Remember: Do not eat or drink after midnight: Monday Take these medicines the morning of surgery with a SIP OF WATER: levothyroxine.  Bring albuterol inhaler with you on day of surgery.  Patient instructed to bring CPAP machine with her on day of surgery and will leave it in truck of car.  Do not wear jewelry, make-up, or FINGER nail polish No metal in your hair or on your body. Do not wear lotions, powders, perfumes.  You may wear deodorant.  Do not bring valuables to the hospital. Contacts, dentures or bridgework may not be worn into surgery.  Leave suitcase in the car. After Surgery it may be brought to your room. For patients being admitted to the hospital, checkout time is 11:00am the day of discharge.  Home with father Kadija Cruzen cell [phone]

## 2014-05-17 NOTE — Pre-Procedure Instructions (Signed)
Dr Jillyn Hidden spoke with this patient at PAT appt.

## 2014-05-17 NOTE — Telephone Encounter (Signed)
Call to patient to review surgery instruction sheet. Had surgery consult with dr Quincy Simmonds yesterday and bowel prep instructions given by Dr Quincy Simmonds. Had PAT appointment with hospital today. She has been instructed regarding CPAP machine from hospital. Post op appointments scheduled.  She plans on 6 weeks out of work. Advised if she needs FMLA forms completed, will need to get those to [place] ASAP as there is a two week turn over for completion.   Routing to provider for final review. Patient agreeable to disposition. Will close encounter

## 2014-05-21 ENCOUNTER — Telehealth: Payer: Self-pay | Admitting: *Deleted

## 2014-05-21 ENCOUNTER — Encounter: Payer: Self-pay | Admitting: *Deleted

## 2014-05-21 NOTE — Telephone Encounter (Signed)
Patient calling with questions for the nurse prior to her surgery tomorrow.

## 2014-05-21 NOTE — Telephone Encounter (Signed)
See next phone encounter for surgery questions. Routing to provider for final review. Patient agreeable to disposition. Will close encounter

## 2014-05-21 NOTE — Telephone Encounter (Signed)
See previous phone note. Patient called this am and left message that she had questions regarding surgery tomorrow. Requesting to know what clear liquids she may have today. Explanation of clear liquids give, broth, jello, popsicles without fruit, black coffee, tea, soda gatorade.   Requesting note from MD to employer stating she is having surgery. She has not yet dropped of forms from employer but states she needs note sent today to: [employer] Fax [phone]. Advised that we can send note this afternoon (Dr Quincy Simmonds out of office this am) but that [facility] paperwork does take 2 weeks to complete so needs to get those to [place] as soon as possible.     Note to your desk to sign.  Routing to provider for final review. Patient agreeable to disposition. Will close encounter

## 2014-05-22 ENCOUNTER — Encounter (HOSPITAL_COMMUNITY)
Admission: RE | Disposition: A | Discharge status: Home / Self Care | Payer: Self-pay | Source: Ambulatory Visit | Attending: Obstetrics and Gynecology

## 2014-05-22 ENCOUNTER — Observation Stay (HOSPITAL_COMMUNITY)
Admission: RE | Admit: 2014-05-22 | Discharge: 2014-05-23 | Disposition: A | Discharge status: Home / Self Care | Payer: BC Managed Care – PPO | Source: Ambulatory Visit | Attending: Obstetrics and Gynecology | Admitting: Obstetrics and Gynecology

## 2014-05-22 ENCOUNTER — Ambulatory Visit: Payer: BC Managed Care – PPO | Admitting: Internal Medicine

## 2014-05-22 ENCOUNTER — Ambulatory Visit (HOSPITAL_COMMUNITY): Payer: BC Managed Care – PPO | Admitting: Anesthesiology

## 2014-05-22 ENCOUNTER — Encounter (HOSPITAL_COMMUNITY): Payer: Self-pay | Admitting: *Deleted

## 2014-05-22 DIAGNOSIS — G4733 Obstructive sleep apnea (adult) (pediatric): Secondary | ICD-10-CM | POA: Diagnosis not present

## 2014-05-22 DIAGNOSIS — N8300 Follicular cyst of ovary, unspecified side: Secondary | ICD-10-CM | POA: Diagnosis not present

## 2014-05-22 DIAGNOSIS — N838 Other noninflammatory disorders of ovary, fallopian tube and broad ligament: Secondary | ICD-10-CM | POA: Diagnosis not present

## 2014-05-22 DIAGNOSIS — E039 Hypothyroidism, unspecified: Secondary | ICD-10-CM | POA: Diagnosis not present

## 2014-05-22 DIAGNOSIS — N920 Excessive and frequent menstruation with regular cycle: Principal | ICD-10-CM | POA: Insufficient documentation

## 2014-05-22 DIAGNOSIS — N736 Female pelvic peritoneal adhesions (postinfective): Secondary | ICD-10-CM | POA: Insufficient documentation

## 2014-05-22 DIAGNOSIS — D251 Intramural leiomyoma of uterus: Secondary | ICD-10-CM | POA: Diagnosis not present

## 2014-05-22 DIAGNOSIS — N840 Polyp of corpus uteri: Secondary | ICD-10-CM | POA: Diagnosis not present

## 2014-05-22 DIAGNOSIS — I10 Essential (primary) hypertension: Secondary | ICD-10-CM | POA: Insufficient documentation

## 2014-05-22 DIAGNOSIS — R9389 Abnormal findings on diagnostic imaging of other specified body structures: Secondary | ICD-10-CM | POA: Insufficient documentation

## 2014-05-22 DIAGNOSIS — N921 Excessive and frequent menstruation with irregular cycle: Secondary | ICD-10-CM | POA: Diagnosis not present

## 2014-05-22 DIAGNOSIS — Z90710 Acquired absence of both cervix and uterus: Secondary | ICD-10-CM | POA: Diagnosis present

## 2014-05-22 DIAGNOSIS — K660 Peritoneal adhesions (postprocedural) (postinfection): Secondary | ICD-10-CM

## 2014-05-22 HISTORY — PX: CYSTOSCOPY: SHX5120

## 2014-05-22 HISTORY — PX: ROBOTIC ASSISTED LAPAROSCOPIC LYSIS OF ADHESION: SHX6080

## 2014-05-22 HISTORY — PX: ROBOTIC ASSISTED TOTAL HYSTERECTOMY WITH BILATERAL SALPINGO OOPHERECTOMY: SHX6086

## 2014-05-22 LAB — PREGNANCY, URINE: PREG TEST UR: NEGATIVE

## 2014-05-22 SURGERY — ROBOTIC ASSISTED TOTAL HYSTERECTOMY WITH BILATERAL SALPINGO OOPHORECTOMY
Anesthesia: General | Site: Bladder

## 2014-05-22 MED ORDER — BUPIVACAINE HCL (PF) 0.25 % IJ SOLN
INTRAMUSCULAR | Status: AC
  Filled 2014-05-22: qty 20

## 2014-05-22 MED ORDER — LACTATED RINGERS IV SOLN
INTRAVENOUS | Status: DC
  Administered 2014-05-22 (×2): via INTRAVENOUS

## 2014-05-22 MED ORDER — SCOPOLAMINE 1 MG/3DAYS TD PT72
1.0000 | MEDICATED_PATCH | Freq: Once | TRANSDERMAL | Status: DC
  Administered 2014-05-22: 1.5 mg via TRANSDERMAL

## 2014-05-22 MED ORDER — KETOROLAC TROMETHAMINE 30 MG/ML IJ SOLN
INTRAMUSCULAR | Status: DC | PRN
  Administered 2014-05-22: 30 mg via INTRAVENOUS

## 2014-05-22 MED ORDER — MENTHOL 3 MG MT LOZG: 1.0000 | LOZENGE | OROMUCOSAL | Status: DC | PRN

## 2014-05-22 MED ORDER — FENTANYL CITRATE 0.05 MG/ML IJ SOLN
INTRAMUSCULAR | Status: AC
  Filled 2014-05-22: qty 5

## 2014-05-22 MED ORDER — GLYCOPYRROLATE 0.2 MG/ML IJ SOLN
INTRAMUSCULAR | Status: DC | PRN
  Administered 2014-05-22: 0.6 mg via INTRAVENOUS

## 2014-05-22 MED ORDER — VITAMIN C 500 MG PO TABS
1000.0000 mg | ORAL_TABLET | Freq: Every morning | ORAL | Status: DC
  Filled 2014-05-22 (×3): qty 2

## 2014-05-22 MED ORDER — LACTATED RINGERS IR SOLN
Status: DC | PRN
  Administered 2014-05-22: 3000 mL

## 2014-05-22 MED ORDER — GLYCOPYRROLATE 0.2 MG/ML IJ SOLN
INTRAMUSCULAR | Status: AC
Start: 2014-05-22 — End: 2014-05-22
  Filled 2014-05-22: qty 3

## 2014-05-22 MED ORDER — OXYCODONE-ACETAMINOPHEN 5-325 MG PO TABS: 1.0000 | ORAL_TABLET | ORAL | Status: DC | PRN

## 2014-05-22 MED ORDER — PHENYLEPHRINE 40 MCG/ML (10ML) SYRINGE FOR IV PUSH (FOR BLOOD PRESSURE SUPPORT)
PREFILLED_SYRINGE | INTRAVENOUS | Status: AC
  Filled 2014-05-22: qty 5

## 2014-05-22 MED ORDER — BUPIVACAINE HCL (PF) 0.25 % IJ SOLN
INTRAMUSCULAR | Status: AC
  Filled 2014-05-22: qty 10

## 2014-05-22 MED ORDER — PROPOFOL 10 MG/ML IV EMUL
INTRAVENOUS | Status: AC
  Filled 2014-05-22: qty 20

## 2014-05-22 MED ORDER — LACTATED RINGERS IV SOLN
INTRAVENOUS | Status: DC
  Administered 2014-05-22 (×2): via INTRAVENOUS

## 2014-05-22 MED ORDER — MORPHINE SULFATE (PF) 1 MG/ML IV SOLN
INTRAVENOUS | Status: DC
  Administered 2014-05-22: 14:00:00 via INTRAVENOUS
  Administered 2014-05-22: 15 mL via INTRAVENOUS
  Filled 2014-05-22: qty 25

## 2014-05-22 MED ORDER — IBUPROFEN 600 MG PO TABS: 600.0000 mg | ORAL_TABLET | Freq: Four times a day (QID) | ORAL | Status: DC | PRN

## 2014-05-22 MED ORDER — NEOSTIGMINE METHYLSULFATE 10 MG/10ML IV SOLN
INTRAVENOUS | Status: AC
  Filled 2014-05-22: qty 1

## 2014-05-22 MED ORDER — ALBUTEROL SULFATE (2.5 MG/3ML) 0.083% IN NEBU
3.0000 mL | INHALATION_SOLUTION | RESPIRATORY_TRACT | Status: DC | PRN
  Administered 2014-05-22: 3 mL via RESPIRATORY_TRACT
  Filled 2014-05-22: qty 3

## 2014-05-22 MED ORDER — ARTIFICIAL TEARS OP OINT
TOPICAL_OINTMENT | OPHTHALMIC | Status: DC | PRN
  Administered 2014-05-22: 1 via OPHTHALMIC

## 2014-05-22 MED ORDER — LIDOCAINE HCL (CARDIAC) 20 MG/ML IV SOLN
INTRAVENOUS | Status: AC
  Filled 2014-05-22: qty 5

## 2014-05-22 MED ORDER — LIDOCAINE HCL (CARDIAC) 20 MG/ML IV SOLN
INTRAVENOUS | Status: DC | PRN
  Administered 2014-05-22: 50 mg via INTRAVENOUS

## 2014-05-22 MED ORDER — ROCURONIUM BROMIDE 100 MG/10ML IV SOLN
INTRAVENOUS | Status: AC
  Filled 2014-05-22: qty 1

## 2014-05-22 MED ORDER — MONTELUKAST SODIUM 10 MG PO TABS
10.0000 mg | ORAL_TABLET | Freq: Every day | ORAL | Status: DC
  Administered 2014-05-22: 10 mg via ORAL
  Filled 2014-05-22 (×2): qty 1

## 2014-05-22 MED ORDER — DEXTROSE 5 % IV SOLN
2.0000 g | INTRAVENOUS | Status: AC
  Administered 2014-05-22: 2 g via INTRAVENOUS
  Filled 2014-05-22: qty 2

## 2014-05-22 MED ORDER — MIDAZOLAM HCL 2 MG/2ML IJ SOLN
INTRAMUSCULAR | Status: DC | PRN
  Administered 2014-05-22: 2 mg via INTRAVENOUS

## 2014-05-22 MED ORDER — NALOXONE HCL 0.4 MG/ML IJ SOLN: 0.4000 mg | INTRAMUSCULAR | Status: DC | PRN

## 2014-05-22 MED ORDER — ONDANSETRON HCL 4 MG/2ML IJ SOLN
INTRAMUSCULAR | Status: AC
  Filled 2014-05-22: qty 2

## 2014-05-22 MED ORDER — NEOSTIGMINE METHYLSULFATE 10 MG/10ML IV SOLN
INTRAVENOUS | Status: DC | PRN
  Administered 2014-05-22: 3 mg via INTRAVENOUS

## 2014-05-22 MED ORDER — ROCURONIUM BROMIDE 100 MG/10ML IV SOLN
INTRAVENOUS | Status: DC | PRN
  Administered 2014-05-22: 20 mg via INTRAVENOUS
  Administered 2014-05-22: 10 mg via INTRAVENOUS
  Administered 2014-05-22: 5 mg via INTRAVENOUS
  Administered 2014-05-22: 45 mg via INTRAVENOUS

## 2014-05-22 MED ORDER — VITAMIN B-12 1000 MCG PO TABS
1000.0000 ug | ORAL_TABLET | Freq: Every morning | ORAL | Status: DC
  Filled 2014-05-22 (×3): qty 1

## 2014-05-22 MED ORDER — SODIUM CHLORIDE 0.9 % IJ SOLN
INTRAMUSCULAR | Status: AC
  Filled 2014-05-22: qty 50

## 2014-05-22 MED ORDER — PROPOFOL 10 MG/ML IV BOLUS
INTRAVENOUS | Status: DC | PRN
  Administered 2014-05-22: 180 mg via INTRAVENOUS

## 2014-05-22 MED ORDER — KETOROLAC TROMETHAMINE 30 MG/ML IJ SOLN
30.0000 mg | Freq: Four times a day (QID) | INTRAMUSCULAR | Status: DC
  Administered 2014-05-22 – 2014-05-23 (×2): 30 mg via INTRAVENOUS
  Filled 2014-05-22 (×3): qty 1

## 2014-05-22 MED ORDER — ROPIVACAINE HCL 5 MG/ML IJ SOLN
INTRAMUSCULAR | Status: DC | PRN
  Administered 2014-05-22: 60 mL via EPIDURAL

## 2014-05-22 MED ORDER — DOCUSATE SODIUM 100 MG PO CAPS
100.0000 mg | ORAL_CAPSULE | Freq: Two times a day (BID) | ORAL | Status: DC
  Administered 2014-05-22 – 2014-05-23 (×2): 100 mg via ORAL
  Filled 2014-05-22 (×2): qty 1

## 2014-05-22 MED ORDER — DEXAMETHASONE SODIUM PHOSPHATE 10 MG/ML IJ SOLN
INTRAMUSCULAR | Status: DC | PRN
  Administered 2014-05-22: 4 mg via INTRAVENOUS

## 2014-05-22 MED ORDER — ONDANSETRON HCL 4 MG PO TABS
4.0000 mg | ORAL_TABLET | Freq: Four times a day (QID) | ORAL | Status: DC | PRN
Start: 2014-05-22 — End: 2014-05-23

## 2014-05-22 MED ORDER — ADULT MULTIVITAMIN W/MINERALS CH
1.0000 | ORAL_TABLET | Freq: Every morning | ORAL | Status: DC
  Filled 2014-05-22 (×3): qty 1

## 2014-05-22 MED ORDER — ONDANSETRON HCL 4 MG/2ML IJ SOLN
INTRAMUSCULAR | Status: DC | PRN
  Administered 2014-05-22: 4 mg via INTRAVENOUS

## 2014-05-22 MED ORDER — LACTATED RINGERS IV SOLN
INTRAVENOUS | Status: DC
  Administered 2014-05-22: 07:00:00 via INTRAVENOUS

## 2014-05-22 MED ORDER — MIDAZOLAM HCL 2 MG/2ML IJ SOLN
INTRAMUSCULAR | Status: AC
  Filled 2014-05-22: qty 2

## 2014-05-22 MED ORDER — KETOROLAC TROMETHAMINE 30 MG/ML IJ SOLN
INTRAMUSCULAR | Status: AC
  Filled 2014-05-22: qty 1

## 2014-05-22 MED ORDER — ROPIVACAINE HCL 5 MG/ML IJ SOLN
INTRAMUSCULAR | Status: AC
  Filled 2014-05-22: qty 60

## 2014-05-22 MED ORDER — DEXAMETHASONE SODIUM PHOSPHATE 4 MG/ML IJ SOLN
INTRAMUSCULAR | Status: AC
  Filled 2014-05-22: qty 1

## 2014-05-22 MED ORDER — ONDANSETRON HCL 4 MG/2ML IJ SOLN
4.0000 mg | Freq: Four times a day (QID) | INTRAMUSCULAR | Status: DC | PRN
  Administered 2014-05-22: 4 mg via INTRAVENOUS
  Filled 2014-05-22: qty 2

## 2014-05-22 MED ORDER — LEVOTHYROXINE SODIUM 50 MCG PO TABS
50.0000 ug | ORAL_TABLET | Freq: Every day | ORAL | Status: DC
  Administered 2014-05-23: 50 ug via ORAL
  Filled 2014-05-22 (×2): qty 1

## 2014-05-22 MED ORDER — SODIUM CHLORIDE 0.9 % IJ SOLN
INTRAMUSCULAR | Status: AC
  Filled 2014-05-22: qty 10

## 2014-05-22 MED ORDER — SCOPOLAMINE 1 MG/3DAYS TD PT72
MEDICATED_PATCH | TRANSDERMAL | Status: AC
  Administered 2014-05-22: 1.5 mg via TRANSDERMAL
  Filled 2014-05-22: qty 1

## 2014-05-22 MED ORDER — MORPHINE SULFATE 4 MG/ML IJ SOLN
1.0000 mg | INTRAMUSCULAR | Status: DC | PRN
  Administered 2014-05-22: 1 mg via INTRAVENOUS
  Filled 2014-05-22: qty 1

## 2014-05-22 MED ORDER — MOMETASONE FURO-FORMOTEROL FUM 200-5 MCG/ACT IN AERO
2.0000 | INHALATION_SPRAY | Freq: Two times a day (BID) | RESPIRATORY_TRACT | Status: DC
Start: 2014-05-22 — End: 2014-05-23
  Administered 2014-05-22 – 2014-05-23 (×2): 2 via RESPIRATORY_TRACT
  Filled 2014-05-22: qty 8.8

## 2014-05-22 MED ORDER — HYDROMORPHONE HCL PF 1 MG/ML IJ SOLN: 0.2500 mg | INTRAMUSCULAR | Status: DC | PRN

## 2014-05-22 MED ORDER — SODIUM CHLORIDE 0.9 % IJ SOLN
INTRAMUSCULAR | Status: DC | PRN
  Administered 2014-05-22: 60 mL via INTRAVENOUS

## 2014-05-22 MED ORDER — DIPHENHYDRAMINE HCL 50 MG/ML IJ SOLN: 12.5000 mg | Freq: Four times a day (QID) | INTRAMUSCULAR | Status: DC | PRN

## 2014-05-22 MED ORDER — PHENYLEPHRINE HCL 10 MG/ML IJ SOLN
INTRAMUSCULAR | Status: DC | PRN
  Administered 2014-05-22: 40 ug via INTRAVENOUS
  Administered 2014-05-22 (×2): 80 ug via INTRAVENOUS

## 2014-05-22 MED ORDER — ARTIFICIAL TEARS OP OINT
TOPICAL_OINTMENT | OPHTHALMIC | Status: AC
  Filled 2014-05-22: qty 3.5

## 2014-05-22 MED ORDER — FLUTICASONE PROPIONATE 50 MCG/ACT NA SUSP: 1.0000 | Freq: Every day | NASAL | Status: DC | PRN

## 2014-05-22 MED ORDER — DIPHENHYDRAMINE HCL 12.5 MG/5ML PO ELIX: 12.5000 mg | ORAL_SOLUTION | Freq: Four times a day (QID) | ORAL | Status: DC | PRN

## 2014-05-22 MED ORDER — FENTANYL CITRATE 0.05 MG/ML IJ SOLN
INTRAMUSCULAR | Status: DC | PRN
  Administered 2014-05-22: 150 ug via INTRAVENOUS
  Administered 2014-05-22: 25 ug via INTRAVENOUS
  Administered 2014-05-22: 100 ug via INTRAVENOUS
  Administered 2014-05-22: 50 ug via INTRAVENOUS

## 2014-05-22 MED ORDER — SODIUM CHLORIDE 0.9 % IJ SOLN: 9.0000 mL | INTRAMUSCULAR | Status: DC | PRN

## 2014-05-22 SURGICAL SUPPLY — 58 items
ADH SKN CLS APL DERMABOND .7 (GAUZE/BANDAGES/DRESSINGS) ×3
BARRIER ADHS 3X4 INTERCEED (GAUZE/BANDAGES/DRESSINGS) IMPLANT
BRR ADH 4X3 ABS CNTRL BYND (GAUZE/BANDAGES/DRESSINGS)
CATH FOLEY 3WAY  5CC 16FR (CATHETERS) ×1
CATH FOLEY 3WAY 5CC 16FR (CATHETERS) ×3 IMPLANT
CLOTH BEACON ORANGE TIMEOUT ST (SAFETY) ×4 IMPLANT
CONT PATH 16OZ SNAP LID 3702 (MISCELLANEOUS) ×8 IMPLANT
COVER TABLE BACK 60X90 (DRAPES) ×16 IMPLANT
COVER TIP SHEARS 8 DVNC (MISCELLANEOUS) ×3 IMPLANT
COVER TIP SHEARS 8MM DA VINCI (MISCELLANEOUS) ×1
DECANTER SPIKE VIAL GLASS SM (MISCELLANEOUS) ×8 IMPLANT
DERMABOND ADVANCED (GAUZE/BANDAGES/DRESSINGS) ×1
DERMABOND ADVANCED .7 DNX12 (GAUZE/BANDAGES/DRESSINGS) ×3 IMPLANT
DRAPE HUG U DISPOSABLE (DRAPE) ×4 IMPLANT
DRAPE WARM FLUID 44X44 (DRAPE) ×4 IMPLANT
DRSG COVADERM PLUS 2X2 (GAUZE/BANDAGES/DRESSINGS) ×16 IMPLANT
DRSG OPSITE POSTOP 3X4 (GAUZE/BANDAGES/DRESSINGS) ×4 IMPLANT
DURAPREP 26ML APPLICATOR (WOUND CARE) ×6 IMPLANT
ELECT REM PT RETURN 9FT ADLT (ELECTROSURGICAL) ×4
ELECTRODE REM PT RTRN 9FT ADLT (ELECTROSURGICAL) ×3 IMPLANT
EVACUATOR SMOKE 8.L (FILTER) ×4 IMPLANT
GAUZE VASELINE 3X9 (GAUZE/BANDAGES/DRESSINGS) IMPLANT
GLOVE BIO SURGEON STRL SZ 6.5 (GLOVE) ×8 IMPLANT
GLOVE BIOGEL PI IND STRL 6.5 (GLOVE) ×3 IMPLANT
GLOVE BIOGEL PI IND STRL 7.0 (GLOVE) ×6 IMPLANT
GLOVE BIOGEL PI INDICATOR 6.5 (GLOVE) ×1
GLOVE BIOGEL PI INDICATOR 7.0 (GLOVE) ×2
GOWN STRL REUS W/TWL LRG LVL3 (GOWN DISPOSABLE) ×40 IMPLANT
KIT ACCESSORY DA VINCI DISP (KITS) ×1
KIT ACCESSORY DVNC DISP (KITS) ×3 IMPLANT
LEGGING LITHOTOMY PAIR STRL (DRAPES) ×4 IMPLANT
NEEDLE INSUFFLATION 150MM (ENDOMECHANICALS) ×2 IMPLANT
OCCLUDER COLPOPNEUMO (BALLOONS) IMPLANT
PACK ROBOT WH (CUSTOM PROCEDURE TRAY) ×4 IMPLANT
PAD PREP 24X48 CUFFED NSTRL (MISCELLANEOUS) ×8 IMPLANT
PROTECTOR NERVE ULNAR (MISCELLANEOUS) ×8 IMPLANT
SCISSORS LAP 5X45 EPIX DISP (ENDOMECHANICALS) ×2 IMPLANT
SET CYSTO W/LG BORE CLAMP LF (SET/KITS/TRAYS/PACK) IMPLANT
SET IRRIG TUBING LAPAROSCOPIC (IRRIGATION / IRRIGATOR) ×8 IMPLANT
SUT VIC AB 0 CT2 27 (SUTURE) ×20 IMPLANT
SUT VIC AB 4-0 PS2 27 (SUTURE) ×8 IMPLANT
SUT VICRYL 0 UR6 27IN ABS (SUTURE) ×8 IMPLANT
SUT VLOC 180 0 6IN GS21 (SUTURE) ×2 IMPLANT
SYRINGE 10CC LL (SYRINGE) ×4 IMPLANT
SYRINGE 60CC LL (MISCELLANEOUS) ×8 IMPLANT
SYSTEM CONVERTIBLE TROCAR (TROCAR) IMPLANT
TIP RUMI ORANGE 6.7MMX12CM (TIP) IMPLANT
TIP UTERINE 5.1X6CM LAV DISP (MISCELLANEOUS) IMPLANT
TIP UTERINE 6.7X10CM GRN DISP (MISCELLANEOUS) IMPLANT
TIP UTERINE 6.7X6CM WHT DISP (MISCELLANEOUS) IMPLANT
TIP UTERINE 6.7X8CM BLUE DISP (MISCELLANEOUS) ×2 IMPLANT
TOWEL OR 17X24 6PK STRL BLUE (TOWEL DISPOSABLE) ×12 IMPLANT
TROCAR DILATING TIP 12MM 150MM (ENDOMECHANICALS) ×4 IMPLANT
TROCAR DISP BLADELESS 8 DVNC (TROCAR) ×3 IMPLANT
TROCAR DISP BLADELESS 8MM (TROCAR) ×1
TROCAR XCEL NON-BLD 5MMX100MML (ENDOMECHANICALS) ×5 IMPLANT
WARMER LAPAROSCOPE (MISCELLANEOUS) ×4 IMPLANT
WATER STERILE IRR 1000ML POUR (IV SOLUTION) ×12 IMPLANT

## 2014-05-22 NOTE — Brief Op Note (Signed)
05/22/2014  12:12 PM  PATIENT:  Sandi Mealy  43 y.o. female  PRE-OPERATIVE DIAGNOSIS:  Menometrorrhagia, thickened endometrium, endometrial polyp, uterine fibroid  POST-OPERATIVE DIAGNOSIS:  Menometrorrhagia, thickened endometrium, endometrial polyp, uterine fibroid, extensive pelvic and abdominal adhesions.  PROCEDURE:  Procedure(s): ROBOTIC ASSISTED TOTAL HYSTERECTOMY WITH BILATERAL SALPINGO OOPHORECTOMY and cystoscopy (N/A) XI ROBOTIC ASSISTED LAPAROSCOPIC LYSIS OF extensive ADHESION (Bilateral) CYSTOSCOPY  SURGEON:  Surgeon(s) and Role:    * Brook E Amundson de Berton Lan, MD - Primary    * Lyman Speller, MD - Assisting  PHYSICIAN ASSISTANT: not applicable  ASSISTANTS:  Lyman Speller, MD   ANESTHESIA:   local, general and  intraperitoneal Ropivicaine  EBL:  Total I/O In: 1800 [I.V.:1800] Out: 270 [Urine:180; Blood:90]  BLOOD ADMINISTERED:none  DRAINS: Urinary Catheter (Foley)   LOCAL MEDICATIONS USED:  OTHER  Ropivicaine  SPECIMEN:  Source of Specimen:   Uterus, cervix, bilateral tubes and ovaries.   DISPOSITION OF SPECIMEN:  PATHOLOGY  COUNTS:  YES  TOURNIQUET:  * No tourniquets in log *  DICTATION: .Other Dictation: Dictation Number    PLAN OF CARE: Admit for overnight observation   PATIENT DISPOSITION:  PACU - hemodynamically stable.   Delay start of Pharmacological VTE agent (>24hrs) due to surgical blood loss or risk of bleeding: not applicable

## 2014-05-22 NOTE — Transfer of Care (Signed)
Immediate Anesthesia Transfer of Care Note  Patient: Destiny Sharp  Procedure(s) Performed: Procedure(s): ROBOTIC ASSISTED TOTAL HYSTERECTOMY WITH BILATERAL SALPINGO OOPHORECTOMY and cystoscopy (N/A) XI ROBOTIC ASSISTED LAPAROSCOPIC LYSIS OF extensive ADHESION (Bilateral) CYSTOSCOPY  Patient Location: PACU  Anesthesia Type:General  Level of Consciousness: awake  Airway & Oxygen Therapy: Patient Spontanous Breathing  Post-op Assessment: Report given to PACU RN  Post vital signs: stable  Filed Vitals:   05/22/14 1323  BP: 127/66  Pulse: 90  Temp: 36.9 C  Resp: 24    Complications: No apparent anesthesia complications

## 2014-05-22 NOTE — Anesthesia Preprocedure Evaluation (Signed)
Anesthesia Evaluation  Patient identified by MRN, date of birth, ID band Patient awake    Reviewed: Allergy & Precautions, H&P , Patient's Chart, lab work & pertinent test results, reviewed documented beta blocker date and time   Airway Mallampati: III TM Distance: >3 FB Neck ROM: full    Dental no notable dental hx.    Pulmonary asthma , sleep apnea and Continuous Positive Airway Pressure Ventilation ,  breath sounds clear to auscultation  Pulmonary exam normal       Cardiovascular Rhythm:regular Rate:Normal     Neuro/Psych    GI/Hepatic   Endo/Other  Hypothyroidism   Renal/GU      Musculoskeletal   Abdominal   Peds  Hematology   Anesthesia Other Findings Chest clear, no recent flare ups of asthma. No steroids recentluy. Uses CPAP semi-regularly Denies HTN; No meds  Reproductive/Obstetrics                           Anesthesia Physical Anesthesia Plan  ASA: III  Anesthesia Plan: General   Post-op Pain Management:    Induction: Intravenous  Airway Management Planned: Oral ETT  Additional Equipment:   Intra-op Plan:   Post-operative Plan: Extubation in OR  Informed Consent: I have reviewed the patients History and Physical, chart, labs and discussed the procedure including the risks, benefits and alternatives for the proposed anesthesia with the patient or authorized representative who has indicated his/her understanding and acceptance.   Dental Advisory Given and Dental advisory given  Plan Discussed with: CRNA and Surgeon  Anesthesia Plan Comments: (  Discussed general anesthesia, including possible nausea, instrumentation of airway, sore throat,pulmonary aspiration, etc. I asked if the were any outstanding questions, or  concerns before we proceeded. )        Anesthesia Quick Evaluation

## 2014-05-22 NOTE — Transfer of Care (Deleted)
Immediate Anesthesia Transfer of Care Note  Patient: Destiny Sharp  Procedure(s) Performed: Procedure(s): ROBOTIC ASSISTED TOTAL HYSTERECTOMY WITH BILATERAL SALPINGO OOPHORECTOMY and cystoscopy (N/A) XI ROBOTIC ASSISTED LAPAROSCOPIC LYSIS OF extensive ADHESION (Bilateral) CYSTOSCOPY  Patient Location: PACU  Anesthesia Type:General  Level of Consciousness: awake  Airway & Oxygen Therapy: Patient Spontanous Breathing  Post-op Assessment: Report given to PACU RN  Post vital signs: stable  Filed Vitals:   05/22/14 1323  BP: 127/66  Pulse: 90  Temp: 36.9 C  Resp: 24    Complications: No apparent anesthesia complications

## 2014-05-22 NOTE — Anesthesia Postprocedure Evaluation (Signed)
Anesthesia Post Note  Patient: Destiny Sharp  Procedure(s) Performed: Procedure(s) (LRB): ROBOTIC ASSISTED TOTAL HYSTERECTOMY WITH BILATERAL SALPINGO OOPHORECTOMY and cystoscopy (N/A) XI ROBOTIC ASSISTED LAPAROSCOPIC LYSIS OF extensive ADHESION (Bilateral) CYSTOSCOPY  Anesthesia type: General  Patient location: PACU  Post pain: Pain level controlled  Post assessment: Post-op Vital signs reviewed  Last Vitals:  Filed Vitals:   05/22/14 1300  BP: 115/66  Pulse: 89  Temp:   Resp: 25    Post vital signs: Reviewed  Level of consciousness: sedated  Complications: No apparent anesthesia complications

## 2014-05-22 NOTE — Progress Notes (Signed)
Update to history and physical  No marked change in status since office preop visit.  Patient examined.   OK to proceed with surgery.  Possible TAH/BSO if has significant adhesions.

## 2014-05-22 NOTE — H&P (Signed)
Brook E Amundson de Carvalho E Silva, MD at 05/16/2014  9:01 AM      Status: Signed            Patient ID: Lenna A Chittick, female   DOB: 04/28/1971, 43 y.o.   MRN: [id number] GYNECOLOGY  VISIT   HPI: 43 y.o.   Single  Caucasian  female    G0P0 with Patient's last menstrual period was 04/08/2014.    here for surgical evaluation. Patient states still having vaginal bleeding even with Megace. Patient's cousin is present for the discussion and examination today.    Patient having heavy and persistent bleeding.   Is taking Megace 20 mg po bid.   Some uterine cramping.     Ultrasound 04/26/14 -   Uterus with 15.84 endometrial thickness and 33 x 16 mm echogenic focus.   17 mm anterior fundal fibroid.   Normal ovaries.   No free fluid.    EMB on 05/03/14 - benign endometrial polyp.   History of laparotomy for ruptured appendix in 2002. Had pneumonia following this and was hospitalized for several weeks.    History of elevated liver function studies.    Patient states she has pain in her right upper quadrant but no gall bladder disease is ever noted.   Has had evaluation in multiple health systems.   Normal CT in 2012.    States she has respiratory problems at least once a year with bronchitis or pneumonia.   Uses CPAP.    Patient is on Vesicare for neurologic problems with bladder.   Overactive bladder since a fall.    Mother died from primary peritoneal tumor.  Patient is BRCA negative - 2008.    Brother has done a directed blood donation for the patient.    GYNECOLOGIC HISTORY: Patient's last menstrual period was 04/08/2014. Contraception:  none   Sexually active:  no Menopausal hormone therapy: n/a Mammogram 02/05/14- negative.   Pap 03/16/14 - negative, HR HPV negative.     OB History     Grav  Para  Term  Preterm  Abortions  TAB  SAB  Ect  Mult  Living     0                                Patient Active Problem List     Diagnosis  Date Noted   .  Vasovagal  attack  03/07/2014   .  Obstructive sleep apnea  01/17/2013   .  Abnormal CT of the chest  10/30/2012   .  Chest pain  10/07/2012   .  Hypothyroidism     .  Hypertension     .  SOB (shortness of breath)     .  Fatigue     .  Edema           Past Medical History   Diagnosis  Date   .  Hypothyroidism     .  Edema     .  Hyperlipidemia     .  Pneumonia     .  Vasovagal syncope     .  Sleep apnea     .  Fibromyalgia     .  Scoliosis     .  Hx of cardiovascular stress test  11/22/12       Lex MV 2/14: EF 81%, no ischemia         Past Surgical History     Procedure  Laterality  Date   .  Appendectomy       .  Nasal sinus surgery       .  Mouth surgery             Current Outpatient Prescriptions   Medication  Sig  Dispense  Refill   .  albuterol (PROVENTIL HFA;VENTOLIN HFA) 108 (90 BASE) MCG/ACT inhaler  Inhale 2 puffs into the lungs every 4 (four) hours as needed for wheezing or shortness of breath.   1 Inhaler   prn   .  calcium carbonate (OS-CAL - DOSED IN MG OF ELEMENTAL CALCIUM) 1250 MG tablet  Take 1 tablet by mouth daily.         .  DENTA 5000 PLUS 1.1 % CREA dental cream           .  fluticasone (FLONASE) 50 MCG/ACT nasal spray  Place 1 spray into both nostrils daily.         .  ipratropium-albuterol (DUONEB) 0.5-2.5 (3) MG/3ML SOLN  Take 3 mLs by nebulization every 6 (six) hours as needed.   360 mL   prn   .  levothyroxine (SYNTHROID, LEVOTHROID) 50 MCG tablet  Take 50 mcg by mouth daily before breakfast.          .  megestrol (MEGACE) 20 MG tablet  Take 1 tablet (20 mg total) by mouth 2 (two) times daily.   60 tablet   0   .  mometasone-formoterol (DULERA) 200-5 MCG/ACT AERO  Inhale 2 puffs then rinse mouth, twice daily- maintenance   1 Inhaler   prn   .  montelukast (SINGULAIR) 10 MG tablet  Take 1 tablet (10 mg total) by mouth at bedtime.   30 tablet   11   .  Multiple Vitamin (MULTIVITAMIN WITH MINERALS) TABS  Take 1 tablet by mouth every morning.          .   Nebulizers (COMPRESSOR/NEBULIZER) MISC  Use as directed for asthma   1 each   prn   .  Omega-3 Fatty Acids (FISH OIL PO)  Take 2,400 mg by mouth daily.         .  OVER THE COUNTER MEDICATION  Place 1 application onto the skin as needed. Essential oil therapy Rosemary, Eucaliptus, Stress Away         .  Spacer/Aero-Holding Chambers (OPTICHAMBER DIAMOND) MISC           .  vitamin B-12 (CYANOCOBALAMIN) 1000 MCG tablet  Take 1,000 mcg by mouth every morning.          .  vitamin C (ASCORBIC ACID) 500 MG tablet  Take 1,000 mg by mouth every morning.              No current facility-administered medications for this visit.        ALLERGIES: Codeine; Hepatitis b virus vaccine; Imitrex; and Pneumococcal vaccines    Family History   Problem  Relation  Age of Onset   .  CAD  Maternal Grandmother     .  Ovarian cancer  Mother  68       ovarian   .  Emphysema  Maternal Uncle     .  Heart disease  Maternal Grandfather     .  Heart disease  Paternal Grandfather     .  Heart disease  Paternal Grandmother     .  Heart disease  Father             History       Social History   .  Marital Status:  Single       Spouse Name:  N/A       Number of Children:  0   .  Years of Education:  N/A       Occupational History   .    [employer]       Social History Main Topics   .  Smoking status:  Never Smoker    .  Smokeless tobacco:  Never Used   .  Alcohol Use:  No   .  Drug Use:  No   .  Sexual Activity:  No       Other Topics  Concern   .  Not on file       Social History Narrative   .  No narrative on file        ROS:  Pertinent items are noted in HPI.   PHYSICAL EXAMINATION:     BP 140/80  Pulse 88  Ht 5' 2.5" (1.588 m)  Wt 183 lb 12.8 oz (83.371 kg)  BMI 33.06 kg/m2  LMP 04/08/2014      General appearance: alert, cooperative and appears stated age Lungs: clear to auscultation bilaterally Heart: regular rate and rhythm Abdomen:  vertical midline incision, soft, non-tender; no masses,  no organomegaly No abnormal inguinal nodes palpated   Pelvic deferred.  Just done with EMB.    ASSESSMENT   Menorrhagia with metrorrhagia. Endometrial polyp. Unresponsive to Megace.   Family history of primary peritoneal tumor.   History of ruptured appendix.    PLAN   Check hgb today:  12.8 Stop Megace.    Counseled on robotic total laparoscopic hysterectomy with bilateral salpingo-oophorectomy and cystoscopy.  Risks and benefits and alternatives discussed with the patient who wishes to proceed. Risks include but are not limited to bleeding, infection, damage to surrounding organs, reactions to anesthesia, pneumonia, DVT, PE, death, neuropathy, need for laparotomy, need for reoperation, and menopausal symptoms requiring estrogen therapy,    Return post op for 1 week visit.      An After Visit Summary was printed and given to the patient.   _25_____ minutes face to face time of which over 50% was spent in counseling.

## 2014-05-22 NOTE — Progress Notes (Signed)
Day of Surgery Procedure(s) (LRB): ROBOTIC ASSISTED TOTAL HYSTERECTOMY WITH BILATERAL SALPINGO OOPHORECTOMY and cystoscopy (N/A) XI ROBOTIC ASSISTED LAPAROSCOPIC LYSIS OF extensive ADHESION (Bilateral) CYSTOSCOPY  Subjective: Patient reports dizziness and shortness of breath.  Feels like a bear is on her chest.  States this is how she feels when she is having an asthma attack. Denies chest pain.  No pain down left arm, into jaw, or into back.  Received about 15 mg morphine over the last 5 hours.   Also has sleep apnea.  Used Ventolin inhaler right before surgery.  Has not used her Dulera inhaler today.  Usually uses twice a day.  Ibuprofen does not cause respiratory problems for patient.   Nurse, nurse tech, critical care nurse, and respiratory tech at bedside now with me.   Objective: I have reviewed patient's vital signs and intake and output. Serial BPs now 118/71, 126/71 Serial pulses 81, 82.  RR rate 14 - 20 O2 sat 96 - 100 % on 2 liters per nasal canula. Urine output 1580 cc.  General: alert and  alert but sedated.  Dozes off.  Resp: clear to auscultation bilaterally Cardio: regular rate and rhythm, S1, S2 normal, no murmur, click, rub or gallop GI: incision:  Umbilical incision with dried blood.  Other incisions covered with bandages and are dry.  and  hypoactive bowel sounds, soft, and nontender.   Vaginal Bleeding: minimal on pad.   EKG now - normal sinus rhythm. No ST segment elevation or depression.   Assessment: s/p Procedure(s): ROBOTIC ASSISTED TOTAL HYSTERECTOMY WITH BILATERAL SALPINGO OOPHORECTOMY and cystoscopy (N/A) XI ROBOTIC ASSISTED LAPAROSCOPIC LYSIS OF extensive ADHESION (Bilateral) CYSTOSCOPY: stable Sedation from morphine PCA. History of asthma.  Normal EKG.  Stable vital signs.   Plan:  Continue foley due to  post op status.  Discontinue PCA.  Will use morphine only for break through pain.  Toradol for pain.  Nebulizer given now.  CPAP  consult already ordered.  CBC and BMP in am.  I reviewed surgery with patient but will need to discuss when she is much more alert tomorrow.  Can give Narcan if needed but will hold off for now as the patient is stable.    LOS: 0 days    Amundson de Berton Lan 05/22/2014, 7:06 PM

## 2014-05-23 ENCOUNTER — Ambulatory Visit: Payer: BC Managed Care – PPO | Admitting: Obstetrics and Gynecology

## 2014-05-23 ENCOUNTER — Encounter (HOSPITAL_COMMUNITY): Payer: Self-pay | Admitting: Obstetrics and Gynecology

## 2014-05-23 ENCOUNTER — Other Ambulatory Visit: Payer: Self-pay | Admitting: Obstetrics and Gynecology

## 2014-05-23 DIAGNOSIS — N920 Excessive and frequent menstruation with regular cycle: Secondary | ICD-10-CM | POA: Diagnosis not present

## 2014-05-23 LAB — CBC
HEMATOCRIT: 31.1 % — AB (ref 36.0–46.0)
HEMOGLOBIN: 10.6 g/dL — AB (ref 12.0–15.0)
MCH: 31.7 pg (ref 26.0–34.0)
MCHC: 34.1 g/dL (ref 30.0–36.0)
MCV: 93.1 fL (ref 78.0–100.0)
Platelets: 214 10*3/uL (ref 150–400)
RBC: 3.34 MIL/uL — ABNORMAL LOW (ref 3.87–5.11)
RDW: 13.5 % (ref 11.5–15.5)
WBC: 7.9 10*3/uL (ref 4.0–10.5)

## 2014-05-23 LAB — BASIC METABOLIC PANEL
Anion gap: 12 (ref 5–15)
BUN: 8 mg/dL (ref 6–23)
CALCIUM: 8.7 mg/dL (ref 8.4–10.5)
CO2: 24 meq/L (ref 19–32)
CREATININE: 0.64 mg/dL (ref 0.50–1.10)
Chloride: 106 mEq/L (ref 96–112)
GFR calc Af Amer: >90 mL/min (ref >90)
GFR calc non Af Amer: >90 mL/min (ref >90)
GLUCOSE: 126 mg/dL — AB (ref 70–99)
Potassium: 4.2 mEq/L (ref 3.7–5.3)
Sodium: 142 mEq/L (ref 137–147)

## 2014-05-23 MED ORDER — PROMETHAZINE HCL 25 MG PO TABS: 25.0000 mg | ORAL_TABLET | Freq: Four times a day (QID) | ORAL | Status: DC | PRN

## 2014-05-23 MED ORDER — IBUPROFEN 600 MG PO TABS: 600.0000 mg | ORAL_TABLET | Freq: Four times a day (QID) | ORAL | Status: DC | PRN

## 2014-05-23 MED ORDER — FERROUS SULFATE 325 (65 FE) MG PO TABS: 325.0000 mg | ORAL_TABLET | Freq: Every day | ORAL | Status: DC

## 2014-05-23 MED ORDER — PROMETHAZINE HCL 12.5 MG PO TABS: 25.0000 mg | ORAL_TABLET | Freq: Four times a day (QID) | ORAL | Status: DC | PRN

## 2014-05-23 MED ORDER — OXYCODONE-ACETAMINOPHEN 5-325 MG PO TABS: 1.0000 | ORAL_TABLET | ORAL | Status: DC | PRN

## 2014-05-23 NOTE — Op Note (Signed)
NAMECADE, OLBERDING NO.:  [id number]  MEDICAL RECORD NO.:  [id number]  LOCATION:  9381                          FACILITY:  [facility]  PHYSICIAN:  Lenard Galloway, M.D.   DATE OF BIRTH:  [birth year]  DATE OF PROCEDURE:  05/22/2014 DATE OF DISCHARGE:                              OPERATIVE REPORT   PREOPERATIVE DIAGNOSES:  Menometrorrhagia, endometrial polyp, uterine fibroid.  POSTOPERATIVE DIAGNOSES:  Menometrorrhagia, endometrial polyp, uterine fibroid, extensive pelvic and abdominal adhesions.  PROCEDURE:  Robotic total laparoscopic hysterectomy with bilateral salpingo-oophorectomy, extensive lysis of pelvic and abdominal adhesions, cystoscopy.  SURGEON:  Lenard Galloway, M.D.  ASSISTANT:  Lyman Speller, MD.  ANESTHESIA:  General endotracheal, local with ropivacaine, intraperitoneal ropivacaine, general endotracheal anesthesia.  IV FLUIDS:  1800 mL Ringer's lactate.  ESTIMATED BLOOD LOSS:  90 mL.  URINE OUTPUT:  180 mL.  COMPLICATIONS:  None.  INDICATIONS FOR THE PROCEDURE:  The patient is a 43 year old gravida 0, para 0, Caucasian female who presented with onset of heavy and irregular bleeding beginning in July 2015.  The patient had an office ultrasound which documented a thickened endometrium and a 33 x 16 mm echogenic focus.  There was a 17 mm anterior fundal fibroid.  The ovaries appeared to be unremarkable.  Endometrial biopsy confirmed the presence of a benign endometrial polyp.  The patient reported a family history of her mother who died from a primary peritoneal tumor.  The patient has declined outpatient hysteroscopic resection of the polyp and she has requested hysterectomy with removal of tubes and ovaries.  The patient's surgical history is significant for a laparotomy for ruptured appendix in 2002.  The patient now presents for a robotic total laparoscopic hysterectomy with bilateral salpingo-oophorectomy, and anticipated lysis of  adhesions and cystoscopy.  Risks, benefits, and alternatives have been reviewed with the patient who wishes to proceed.  FINDINGS:  Examination under anesthesia revealed a small anteverted mobile uterus.  No adnexal masses were appreciated.  At the time of laparoscopy, the patient was noted to have extensive adhesions which began at the level of the umbilicus and extended down the patient's right and left sides and all the way into the pelvis and across the uterine fundus and bladder.  There were also adhesions of the tubes and ovaries to their ipsilateral pelvic sidewalls.  There was no evidence of obvious endometriosis in the abdomen or the pelvis.  Bowel adhesions between the cecum and the right side of the pelvis above the level of the infundibulopelvic ligament were not taken down in entirety. There were also some filmy adhesions between the sigmoid colon and the left pelvic sidewall and the cul-de-sac which were not necessary to take down as well.  There were no bands of tissue noted in either of these locations.  In the upper abdomen, the liver and gallbladder appeared to be unremarkable.  The uterus appeared to have a 1-1/2 cm fundal intramural anterior fibroid.   The fallopian tubes and the ovaries were unremarkable other than the adhesive disease.  Cystoscopy at termination of the procedure documented the bladder to be intact throughout 360 degrees.  There was a normal bladder dome and trigone.  The  ureters were noted to be patent bilaterally.  SPECIMENS:  The uterus with cervix and bilateral tubes and ovaries were sent to pathology.  PROCEDURE IN DETAIL:  The patient was reidentified in the preoperative hold area.  She did receive cefotetan 2 g IV for antibiotic prophylaxis. She received both TED hose and PAS stockings for DVT prophylaxis.  In the operating room, the patient was placed in the supine position on the operating room table.  General endotracheal anesthesia  was induced. The patient was then placed in the dorsal lithotomy position.  She was placed on the blue pad.  Her body was appropriately padded.   Both arms were tucked at her sides.  The abdomen and vagina were then sterilely prepped and draped.  The speculum was placed inside the vagina.  The single-tooth tenaculum was placed on the anterior cervical lip.  A figure-of-eight suture of 0 Vicryl was placed on each of the anterior and posterior cervical lips. The uterus was sounded to almost 9 cm.  The cervix was appropriately dilated with [facility] dilators.  The #8 RUMI  tip was then introduced into the uterine cavity and the balloon was inflated.  There was a good application of the KOH ring to the cervix.  The remaining vaginal instruments were removed. A 3 way foley catheter was placed inside the bladder And was left to gravity drainage throughout the procedure.   During placement of the RUMI manipulator, the abdomen was entered with a 5 mm laparoscope and the Optiview in the patient's left upper quadrant. The skin was first injected with ropivacaine and the incision was sharply created prior to placing the trocar without difficulty.  The pneumoperitoneum was achieved at this time.  An 8 mm robotic trocar was then placed in the right mid quadrant after the skin was injected locally with ropivacaine and the incision was created sharply.  A monopolar scissors was then used to begin the lysis of adhesions through visualization of the 5 mm laparoscope.  This lysis of adhesions continued for approximately 1 hour prior to placing the remainder of the robotic trocars.  The adhesions were systematically taken down beginning in the area of the upper abdomen underneath the umbilicus.  Hemostasis was achieved with monopolar energy during this dissection and there was very little blood loss.  A 12 mm supraumbilical incision was created at this time after the skin was injected locally and the  incision was created sharply with a scalpel.  The trocar was placed into the peritoneal cavity without difficulty.  Please note that ropivacaine was introduced into the pelvic cavity. Prior to the beginning of the lysis of adhesions.  The additional left-sided 8 mm robotic trocar was placed at this time in the left mid abdomen.  The skin was injected locally with ropivacaine and the incision was created sharply with a scalpel.  The trocar was placed under direct visualization of the laparoscope.  At this time, the patient was placed in the Trendelenburg position.  The robot was docked.  The PK instrument was placed in port #2 and the monopolar scissors was placed in port #1.  At this time, I stepped to the surgeon's console.  I continued with lysis of adhesions using both the monopolar scissors and the PK instrument.  After satisfactory  lysis of adhesions and normalization of the anatomy, the hysterectomy was begun. The right ureter was followed along the right pelvic sidewall.  The infundibulopelvic ligament was then triply cauterized with the PK instrument and bisected  with monopolar scissors.  Dissection continued through the broad ligament again using the same instrumentation for good hemostasis.  The right round ligament was cauterized again with the PK instrument was bisected with the monopolar scissors.  The anterior and posterior leaves of the broad ligament were opened using the laparoscopic scissors both anteriorly and posteriorly.  The bladder flap was taken down anteriorly.  The uterine artery was skeletonized along the patient's right hand side and then it was cauterized multiple times before it was cut with a laparoscopic scissors.    The same procedure that was performed on the patient's right-hand side  was then repeated on the patient's left-hand side.  The left ureter was carefully identified.  The left infundibulopelvic ligament was triply cauterized with the  PK instrument and bisected again with the monopolar scissors.  There were some dense adhesions between the ovary and the left pelvic sidewall and these were removed with a combination of blunt dissection and also with cautery and cutting with the PK and the monopolar scissors.  Care was taken to reidentify the ureter multiple times during this dissection and it was noted to be well out of the surgical field.  The round ligament was similarly isolated, cauterized, and cut using the PK instrument and the monopolar cautery. The anterior and posterior leaves of the broad ligament were taken down with a laparoscopic scissors.  The left uterine artery was then cauterized with the PK instrument multiple times and it was bisected with a laparoscopic scissors.  The bladder flap was adequately down at this time and the colpotomy incision was created with the monopolar scissors in a clockwise fashion beginning anteriorly.  The uterus, cervix, bilateral tubes, and ovaries were removed from the vagina at this time, and the vaginal occluder was then placed.  The ProGrasp and the SutureCut instruments were then used for closure of the vaginal cuff.  A V lock suture was placed inside the peritoneal cavity and the cuff was sewn from right to left and then back 2 sutures toward the midline.  The pelvis was irrigated and suctioned and carefully examined at this time.  Hemostasis was noted to be good.  There was some minor adhesion along the omentum which was oozing slightly and this  responded well to a combination of bipolar and monopolar cautery.  The vaginal cuff suture was cut after all of the pedicles were examined and found to be hemostatic.  The suture was parked along the right pericolic gutter.  The robotic instruments were removed and the robot was undocked.  I rescrubbed and stepped to the patient's bedside at this time and the entire abdomen was surveyed and there was no evidence of  bleeding appreciated.  The needle was removed from the peritoneal cavity.  The trocars were removed under visualization of the laparoscope.  The umbilical trocar was removed last after the patient received manual breaths and any remaining CO2 was removed.  The umbilical incision was closed at this time with a figure-of-eight suture of 0 Vicryl.  All skin incisions were closed with subcuticular sutures of 4-0 Vicryl followed by Dermabond.  Cystoscopy was performed after the patient's 3 way Foley catheter was removed and the findings are as noted above.  A 14-French Foley was then placed inside the bladder and left to gravity drainage.    The speculum was placed inside the vagina and the vaginal cuff was noted to be hemostatic.  There was a small laceration at the posterior hymen at the  6 o'clock position and a figure-of-eight suture of 2-0 Vicryl was placed for good hemostasis.  The patient was cleansed of any remaining Betadine.  She was awakened and extubated.  She was escorted to the recovery room in stable condition.  There were no complications to the procedure.  All needle, instrument, and sponge counts were correct.     Lenard Galloway, M.D.     BES/MEDQ  D:  05/22/2014  T:  05/23/2014  Job:  [id number]

## 2014-05-23 NOTE — Discharge Instructions (Signed)
Total Laparoscopic Hysterectomy, Care After °Refer to this sheet in the next few weeks. These instructions provide you with information on caring for yourself after your procedure. Your health care provider may also give you more specific instructions. Your treatment has been planned according to current medical practices, but problems sometimes occur. Call your health care provider if you have any problems or questions after your procedure. °WHAT TO EXPECT AFTER THE PROCEDURE °· Pain and bruising at the incision sites. You will be given pain medicine to control it. °· Menopausal symptoms such as hot flashes, night sweats, and insomnia if your ovaries were removed. °· Sore throat from the breathing tube that was inserted during surgery. °HOME CARE INSTRUCTIONS °· Only take over-the-counter or prescription medicines for pain, discomfort, or fever as directed by your health care provider.   °· Do not take aspirin. It can cause bleeding.   °· Do not drive when taking pain medicine.   °· Follow your health care provider's advice regarding diet, exercise, lifting, driving, and general activities.   °· Resume your usual diet as directed and allowed.   °· Get plenty of rest and sleep.   °· Do not douche, use tampons, or have sexual intercourse for at least 6 weeks, or until your health care provider gives you permission.   °· Change your bandages (dressings) as directed by your health care provider.   °· Monitor your temperature and notify your health care provider of a fever.   °· Take showers instead of baths for 2-3 weeks.   °· Do not drink alcohol until your health care provider gives you permission.   °· If you develop constipation, you may take a mild laxative with your health care provider's permission. Bran foods may help with constipation problems. Drinking enough fluids to keep your urine clear or pale yellow may help as well.   °· Try to have someone home with you for 1-2 weeks to help around the house.    °· Keep all of your follow-up appointments as directed by your health care provider.   °SEEK MEDICAL CARE IF: °· You have swelling, redness, or increasing pain around your incision sites.   °· You have pus coming from your incision.   °· You notice a bad smell coming from your incision.   °· Your incision breaks open.   °· You feel dizzy or lightheaded.   °· You have pain or bleeding when you urinate.   °· You have persistent diarrhea.   °· You have persistent nausea and vomiting.   °· You have abnormal vaginal discharge.   °· You have a rash.   °· You have any type of abnormal reaction or develop an allergy to your medicine.   °· You have poor pain control with your prescribed medicine.   °SEEK IMMEDIATE MEDICAL CARE IF: °· You have chest pain or shortness of breath. °· You have severe abdominal pain that is not relieved with pain medicine. °· You have pain or swelling in your legs. °MAKE SURE YOU: °· Understand these instructions. °· Will watch your condition. °· Will get help right away if you are not doing well or get worse. °Document Released: 06/14/2013 Document Revised: 08/29/2013 Document Reviewed: 06/14/2013 °[employer]® Patient Information ©2015 [employer]. This information is not intended to replace advice given to you by your health care provider. Make sure you discuss any questions you have with your health care provider. ° °

## 2014-05-23 NOTE — Progress Notes (Signed)
Patient given discharge instructions and follow up care. Patient verbalized understanding. Pt discharged home, escorted outside. Vitals stable. See Chart.   Noralee Stain RN

## 2014-05-23 NOTE — Anesthesia Postprocedure Evaluation (Signed)
  Anesthesia Post-op Note  Patient: Destiny Sharp  Procedure(s) Performed: Procedure(s): ROBOTIC ASSISTED TOTAL HYSTERECTOMY WITH BILATERAL SALPINGO OOPHORECTOMY and cystoscopy (N/A) XI ROBOTIC ASSISTED LAPAROSCOPIC LYSIS OF extensive ADHESION (Bilateral) CYSTOSCOPY  [facility]: [facility]  Anesthesia Type:General  Level of Consciousness: awake, alert , oriented and patient cooperative  Airway and Oxygen Therapy: Patient Spontanous Breathing  Post-op Pain: mild  Post-op Assessment: Patient's Cardiovascular Status Stable, Respiratory Function Stable, No signs of Nausea or vomiting, Adequate PO intake and Pain level controlled  Post-op Vital Signs: stable  Last Vitals:  Filed Vitals:   05/23/14 0516  BP: 110/65  Pulse: 81  Temp: 36.9 C  Resp: 16    Complications: No apparent anesthesia complications

## 2014-05-23 NOTE — Addendum Note (Signed)
Addendum created 05/23/14 1004 by Georgeanne Nim, CRNA   Modules edited: Notes Section   Notes Section:  File: [id number]

## 2014-05-23 NOTE — Progress Notes (Signed)
Ur chart review completed.  

## 2014-05-24 NOTE — Progress Notes (Signed)
2 Days Post-Op Procedure(s) (LRB): ROBOTIC ASSISTED TOTAL HYSTERECTOMY WITH BILATERAL SALPINGO OOPHORECTOMY and cystoscopy (N/A) XI ROBOTIC ASSISTED LAPAROSCOPIC LYSIS OF extensive ADHESION (Bilateral) CYSTOSCOPY  Subjective: Patient reports breathing problems resolved.  No shortness of breath or chest pain issues. Ambulated.  Foley out.  Good pain control with oral medication.  Objective: I have reviewed patient's vital signs, intake and output and labs. Hgb 10.6 General: alert Resp: clear to auscultation bilaterally Cardio: regular rate and rhythm, S1, S2 normal, no murmur, click, rub or gallop GI: soft, non-tender; bowel sounds normal; no masses,  no organomegaly and incision: clean, dry and intact Vaginal Bleeding: none  Assessment: s/p Procedure(s): ROBOTIC ASSISTED TOTAL HYSTERECTOMY WITH BILATERAL SALPINGO OOPHORECTOMY and cystoscopy (N/A) XI ROBOTIC ASSISTED LAPAROSCOPIC LYSIS OF extensive ADHESION (Bilateral) CYSTOSCOPY: progressing well Ready for discharge.  Plan: Encourage ambulation Discharge home Percocet and Motrin for pain.  Instructions and precautions given.  Reviewed surgical findings and procedure. Follow up in one week. This is a late entry.  Patient was seen 05/23/14 am at 7:30 am.  LOS: 1 day    Amundson de Berton Lan 05/24/2014, 6:57 AM

## 2014-05-30 ENCOUNTER — Ambulatory Visit: Payer: BC Managed Care – PPO | Admitting: Internal Medicine

## 2014-05-30 ENCOUNTER — Encounter: Payer: Self-pay | Admitting: Obstetrics and Gynecology

## 2014-05-30 ENCOUNTER — Ambulatory Visit (INDEPENDENT_AMBULATORY_CARE_PROVIDER_SITE_OTHER): Payer: BC Managed Care – PPO | Admitting: Obstetrics and Gynecology

## 2014-05-30 VITALS — BP 122/80 | HR 72 | Ht 62.0 in | Wt 180.0 lb

## 2014-05-30 DIAGNOSIS — Z9889 Other specified postprocedural states: Secondary | ICD-10-CM

## 2014-05-30 MED ORDER — ESTRADIOL 0.1 MG/24HR TD PTTW: 1.0000 | MEDICATED_PATCH | TRANSDERMAL | Status: DC

## 2014-05-30 NOTE — Progress Notes (Signed)
GYNECOLOGY  VISIT   HPI: 43 y.o.   Single  Caucasian  female   G0P0 with No LMP recorded.   here for   1 week follow up on Robotic Assisted Total Hysterectomy with Bilateral Salpingo Oophorectomy and Cystoscopy Having some hot flashes.  Managing so far.  Uncertain if she wants to take HRT.  Walking three times a day outside.  Some constipation.  Still taking narcotic and ibuprofen.   GYNECOLOGIC HISTORY: No LMP recorded. Contraception:   no Menopausal hormone therapy: no        OB History   Grav Para Term Preterm Abortions TAB SAB Ect Mult Living   0                  Patient Active Problem List   Diagnosis Date Noted  . Status post laparoscopic hysterectomy 05/22/2014  . Vasovagal attack 03/07/2014  . Obstructive sleep apnea 01/17/2013  . Abnormal CT of the chest 10/30/2012  . Chest pain 10/07/2012  . Hypothyroidism   . Hypertension   . SOB (shortness of breath)   . Fatigue   . Edema     Past Medical History  Diagnosis Date  . Hypothyroidism   . Edema     LLE  . Hyperlipidemia     diet controlled - no med  . Vasovagal syncope     one episode only  . Fibromyalgia   . Scoliosis     history  . Hx of cardiovascular stress test 11/22/12    Lex MV 2/14: EF 81%, no ischemia  . Asthma   . Pneumonia     hx  . Bronchitis     one or two times/year  . Sleep apnea     Does not use CPAP every night  . Headache(784.0)     otc med prn  . Anxiety     no meds  . PONV (postoperative nausea and vomiting)     Past Surgical History  Procedure Laterality Date  . Appendectomy    . Nasal sinus surgery      x 3  . Mouth surgery      wisdom teeth ext  . Robotic assisted total hysterectomy with bilateral salpingo oopherectomy N/A 05/22/2014    Procedure: ROBOTIC ASSISTED TOTAL HYSTERECTOMY WITH BILATERAL SALPINGO OOPHORECTOMY and cystoscopy;  Surgeon: Jamey Reas de Berton Lan, MD;  Location: [facility];  Service: Gynecology;  Laterality: N/A;  . Robotic assisted  laparoscopic lysis of adhesion Bilateral 05/22/2014    Procedure: XI ROBOTIC ASSISTED LAPAROSCOPIC LYSIS OF extensive ADHESION;  Surgeon: Jamey Reas de Berton Lan, MD;  Location: [facility];  Service: Gynecology;  Laterality: Bilateral;  . Cystoscopy  05/22/2014    Procedure: CYSTOSCOPY;  Surgeon: Jamey Reas de Berton Lan, MD;  Location: [facility];  Service: Gynecology;;    Current Outpatient Prescriptions  Medication Sig Dispense Refill  . albuterol (PROVENTIL HFA;VENTOLIN HFA) 108 (90 BASE) MCG/ACT inhaler Inhale 2 puffs into the lungs every 4 (four) hours as needed for wheezing or shortness of breath.  1 Inhaler  prn  . calcium carbonate (OS-CAL - DOSED IN MG OF ELEMENTAL CALCIUM) 1250 MG tablet Take 1 tablet by mouth daily.      . DENTA 5000 PLUS 1.1 % CREA dental cream       . fluticasone (FLONASE) 50 MCG/ACT nasal spray Place 1 spray into both nostrils daily.      Marland Kitchen ibuprofen (ADVIL,MOTRIN) 600 MG tablet Take 1  tablet (600 mg total) by mouth every 6 (six) hours as needed (mild pain).  30 tablet  0  . ipratropium-albuterol (DUONEB) 0.5-2.5 (3) MG/3ML SOLN Take 3 mLs by nebulization every 6 (six) hours as needed.  360 mL  prn  . levothyroxine (SYNTHROID, LEVOTHROID) 50 MCG tablet Take 50 mcg by mouth daily before breakfast.       . mometasone-formoterol (DULERA) 200-5 MCG/ACT AERO Inhale 2 puffs then rinse mouth, twice daily- maintenance  1 Inhaler  prn  . montelukast (SINGULAIR) 10 MG tablet Take 1 tablet (10 mg total) by mouth at bedtime.  30 tablet  11  . Multiple Vitamin (MULTIVITAMIN WITH MINERALS) TABS Take 1 tablet by mouth every morning.       . Nebulizers (COMPRESSOR/NEBULIZER) MISC Use as directed for asthma  1 each  prn  . Omega-3 Fatty Acids (FISH OIL PO) Take 2,400 mg by mouth daily.      Marland Kitchen OVER THE COUNTER MEDICATION Place 1 application onto the skin as needed. Essential oil therapy Rosemary, Eucaliptus, Stress Away      . oxyCODONE-acetaminophen  (PERCOCET/ROXICET) 5-325 MG per tablet Take 1-2 tablets by mouth every 4 (four) hours as needed for severe pain (moderate to severe pain (when tolerating fluids)).  30 tablet  0  . promethazine (PHENERGAN) 25 MG tablet Take 1 tablet (25 mg total) by mouth every 6 (six) hours as needed for nausea or vomiting.  10 tablet  0  . Spacer/Aero-Holding Chambers ([place]) MISC       . vitamin B-12 (CYANOCOBALAMIN) 1000 MCG tablet Take 1,000 mcg by mouth every morning.       . vitamin C (ASCORBIC ACID) 500 MG tablet Take 1,000 mg by mouth every morning.        No current facility-administered medications for this visit.     ALLERGIES: Codeine; Hepatitis b virus vaccine; Imitrex; and Pneumococcal vaccines  Family History  Problem Relation Age of Onset  . CAD Maternal Grandmother   . Ovarian cancer Mother 10    ovarian  . Emphysema Maternal Uncle   . Heart disease Maternal Grandfather   . Heart disease Paternal Grandfather   . Heart disease Paternal Grandmother   . Heart disease Father     History   Social History  . Marital Status: Single    Spouse Name: N/A    Number of Children: 0  . Years of Education: N/A   Occupational History  .  [employer]   Social History Main Topics  . Smoking status: Never Smoker   . Smokeless tobacco: Never Used  . Alcohol Use: No  . Drug Use: No  . Sexual Activity: No   Other Topics Concern  . Not on file   Social History Narrative  . No narrative on file    ROS:  Pertinent items are noted in HPI.  PHYSICAL EXAMINATION:    BP 122/80  Pulse 72  Ht 5\' 2"  (1.575 m)  Wt 180 lb (81.647 kg)  BMI 32.91 kg/m2     General appearance: alert, cooperative and appears stated age  Abdomen: incisions intact with Dermabond and sub Q sutures, soft, non-tender; no masses,  no organomegaly No abnormal inguinal nodes palpated  Pathology - fibroids, benign endometrium, benign tubes and ovaries.    ASSESSMENT  Doing well post op. Surgical menopause.  Constipation.  PLAN  OK for daily Miralax. Start Estradiol 0.1 mg to skin twice daily.  Discussed risk  of DVT, PE, MI, stroke and breast cancer.  Discussed benefits of bone and heart protection at her young age. Return for 6 week post op visit.     An After Visit Summary was printed and given to the patient.  _15_____ minutes face to face time of which over 50% was spent in counseling.

## 2014-05-30 NOTE — Patient Instructions (Signed)
Estradiol skin patches What is this medicine? ESTRADIOL (es tra DYE ole) skin patches contain an estrogen. It is mostly used as hormone replacement in menopausal women. It helps to treat hot flashes and prevent osteoporosis. It is also used to treat women with low estrogen levels or those who have had their ovaries removed. This medicine may be used for other purposes; ask your health care provider or pharmacist if you have questions. COMMON BRAND NAME(S): Alora, Climara, Esclim, Estraderm, FemPatch, Menostar, Minivelle, Vivelle, Vivelle-Dot What should I tell my health care provider before I take this medicine? They need to know if you have any of these conditions: -abnormal vaginal bleeding -blood vessel disease or blood clots -breast, cervical, endometrial, ovarian, liver, or uterine cancer -dementia -diabetes -gallbladder disease -heart disease or recent heart attack -high blood pressure -high cholesterol -high level of calcium in the blood -hysterectomy -kidney disease -liver disease -migraine headaches -protein C deficiency -protein S deficiency -stroke -systemic lupus erythematosus (SLE) -tobacco smoker -an unusual or allergic reaction to estrogens, other hormones, medicines, foods, dyes, or preservatives -pregnant or trying to get pregnant -breast-feeding How should I use this medicine? This medicine is for external use only. Follow the directions on the prescription label. Tear open the pouch, do not use scissors. Remove the stiff protective liner covering the adhesive. Try not to touch the adhesive. Apply the patch, sticky side to the skin, to an area that is clean, dry and hairless. Avoid injured, irritated, calloused, or scarred areas. Do not apply the skin patches to your breasts or around the waistline. Use a different site each time to prevent skin irritation. Do not cut or trim the patch. Do not stop using except on the advice of your doctor or health care professional.  Do not wear more than one patch at a time unless you are told to do so by your doctor or health care professional. Contact your pediatrician regarding the use of this medicine in children. Special care may be needed. A patient package insert for the product will be given with each prescription and refill. Read this sheet carefully each time. The sheet may change frequently. Overdosage: If you think you have taken too much of this medicine contact a poison control center or emergency room at once. NOTE: This medicine is only for you. Do not share this medicine with others. What if I miss a dose? If you miss a dose, apply it as soon as you can. If it is almost time for your next dose, apply only that dose. Do not apply double or extra doses. What may interact with this medicine? Do not take this medicine with any of the following medications: -aromatase inhibitors like aminoglutethimide, anastrozole, exemestane, letrozole, testolactone This medicine may also interact with the following medications: -carbamazepine -certain antibiotics used to treat infections -certain barbiturates used for inducing sleep or treating seizures -grapefruit juice -medicines for fungus infections like itraconazole and ketoconazole -raloxifene or tamoxifen -rifabutin, rifampin, or rifapentine -ritonavir -St. John's Wort This list may not describe all possible interactions. Give your health care provider a list of all the medicines, herbs, non-prescription drugs, or dietary supplements you use. Also tell them if you smoke, drink alcohol, or use illegal drugs. Some items may interact with your medicine. What should I watch for while using this medicine? Visit your doctor or health care professional for regular checks on your progress. You will need a regular breast and pelvic exam and Pap smear while on this medicine. You should also   discuss the need for regular mammograms with your health care professional, and follow  his or her guidelines for these tests. This medicine can make your body retain fluid, making your fingers, hands, or ankles swell. Your blood pressure can go up. Contact your doctor or health care professional if you feel you are retaining fluid. If you have any reason to think you are pregnant, stop taking this medicine right away and contact your doctor or health care professional. Smoking increases the risk of getting a blood clot or having a stroke while you are taking this medicine, especially if you are more than 43 years old. You are strongly advised not to smoke. If you wear contact lenses and notice visual changes, or if the lenses begin to feel uncomfortable, consult your eye doctor or health care professional. This medicine can increase the risk of developing a condition (endometrial hyperplasia) that may lead to cancer of the lining of the uterus. Taking progestins, another hormone drug, with this medicine lowers the risk of developing this condition. Therefore, if your uterus has not been removed (by a hysterectomy), your doctor may prescribe a progestin for you to take together with your estrogen. You should know, however, that taking estrogens with progestins may have additional health risks. You should discuss the use of estrogens and progestins with your health care professional to determine the benefits and risks for you. If you are going to have surgery or an MRI, you may need to stop taking this medicine. Consult your health care professional for advice before you schedule the surgery. You may bathe or participate in other activities while wearing your patch. If the patch pulls loose or falls off, you may reapply it if the patch is sticky enough to stay on the skin. You should reapply the patch in a different area. Use a fresh patch if it will no longer stick. What side effects may I notice from receiving this medicine? Side effects that you should report to your doctor or health care  professional as soon as possible: -allergic reactions like skin rash, itching or hives, swelling of the face, lips, or tongue -breast tissue changes or discharge -changes in vision -chest pain -confusion, trouble speaking or understanding -dark urine -general ill feeling or flu-like symptoms -light-colored stools -nausea, vomiting -pain, swelling, warmth in the leg -right upper belly pain -severe headaches -shortness of breath -sudden numbness or weakness of the face, arm or leg -trouble walking, dizziness, loss of balance or coordination -unusual vaginal bleeding -yellowing of the eyes or skin Side effects that usually do not require medical attention (report to your doctor or health care professional if they continue or are bothersome): -hair loss -increased hunger or thirst -increased urination -symptoms of vaginal infection like itching, irritation or unusual discharge -unusually weak or tired This list may not describe all possible side effects. Call your doctor for medical advice about side effects. You may report side effects to FDA at 1-800-FDA-1088. Where should I keep my medicine? Keep out of the reach of children. Store at room temperature below 30 degrees C (86 degrees F). Do not store any patches that have been removed from their protective pouch. Throw away any unused medicine after the expiration date. Dispose of used patches properly. Since used patches may still contain active hormones, fold the patch in half so that it sticks to itself prior to disposal. NOTE: This sheet is a summary. It may not cover all possible information. If you have questions about this medicine,   talk to your doctor, pharmacist, or health care provider.  2015, Elsevier/Gold Standard. (2010-11-26 09:19:41)  

## 2014-06-02 NOTE — Discharge Summary (Signed)
Physician Discharge Summary  Patient ID: Destiny Sharp MRN: [id number] DOB/AGE: 43/26/1972 43 y.o.  Admit date: 05/22/2014 Discharge date: 05/23/14.  Admission Diagnoses: 1. Menometrorrhagia 2. Endometrial polyp 3. Uterine fibroid.   Discharge Diagnoses:  Active Problems:   Status post laparoscopic hysterectomy 1. Menometrorrhagia 2. Endometrial polyp  3. Uterine fibroid 4. Extensive pelvic and abdominal adhesions.  5. Status post robotic total laparoscopic hysterectomy with bilateral  salpingo-oophorectomy, extensive lysis of pelvic and abdominal  adhesions, cystoscopy. 6. Asthma attack post op day zero.  Discharged Condition: good  Hospital Course:  The patient was admitted on 05/22/14 for robotic total laparoscopic hysterectomy with bilateral salpingo-oophorectomy, extensive lysis of pelvic and abdominal  adhesions, cystoscopy.which were performed without complication while under general anesthesia.  The patient's post op course was eventful for an asthma attack the early evening following her surgical procedure.  Patient was noted to be short of breath and feeling like a bear hugging her on routine post op check by me.  She stated that this was how she felt when she had an asthma attak.She had normal vital signs and O2 saturation, but was noted to have decreased air exchange on lung exam.  A respiratory therapy care consultation was ordered and she was given her Dulera and Ventolin inhaler for use while awaiting consultation.  An EKG was also ordered and was noted to be normal sinus rhythm and without signs of ischemia.  The patient then received an Albuteral nebulizer treatment.  The critical care nurse was present at bedside while I directed the patient's care.  I remained at bedside until the patient was stable and feeling more comfortable with her breathing.  She rapidly improved.  The remainder of her hospitalization was unremarkable.   The patient had had a morphine PCA and  Toradol for pain control initially, and this was converted over to Percocet and Motrin on post op day one when the patient began taking po well.  The patient received NSAID treatment as she had previously been able to tolerate these medications.  She tolerated a regular diet.  She ambulated independently and wore PAS and Ted hose for DVT prophylaxis while in bed.  Her foley catheter were removed on post op day one, and she voided well. The patient's vital signs remained stable and she demonstrated no signs of infection during her hospitalization.  The patient's post op day one Hgb was 10.6, and she was tolerating this well. She was found to be in good condition and ready for discharge on post op day one.  Consults: None  Significant Diagnostic Studies: labs:  Hgb 10.6 and glucose 126 on 05/23/14  and EKG showing normal sinus rhythm on post op day zero.   Treatments: surgery: 05/22/14 -  Robotic total laparoscopic hysterectomy with bilateral  salpingo-oophorectomy, extensive lysis of pelvic and abdominal adhesions, cystoscopy. Nebulizer respiratory therapy on 05/22/14 on GYN unit post op.   Discharge Exam: Blood pressure 118/62, pulse 99, temperature 98.3 F (36.8 C), temperature source Oral, resp. rate 18, height 5\' 2"  (1.575 m), weight 183 lb (83.008 kg), last menstrual period 04/08/2014, SpO2 99.00%.  General: alert  Resp: clear to auscultation bilaterally  Cardio: regular rate and rhythm, S1, S2 normal, no murmur, click, rub or gallop  GI: soft, non-tender; bowel sounds normal; no masses, no organomegaly and incision: clean, dry and intact  Vaginal Bleeding: none  Disposition: 01-Home or Self Care  Instructions and precautions given to the patient verbally and in written form.  Medication List    STOP taking these medications       megestrol 20 MG tablet  Commonly known as:  MEGACE      TAKE these medications       albuterol 108 (90 BASE) MCG/ACT inhaler  Commonly known as:   PROVENTIL HFA;VENTOLIN HFA  Inhale 2 puffs into the lungs every 4 (four) hours as needed for wheezing or shortness of breath.     calcium carbonate 1250 MG tablet  Commonly known as:  OS-CAL - dosed in mg of elemental calcium  Take 1 tablet by mouth daily.     Compressor/Nebulizer Misc  Use as directed for asthma     DENTA 5000 PLUS 1.1 % Crea dental cream  Generic drug:  sodium fluoride     FISH OIL PO  Take 2,400 mg by mouth daily.     fluticasone 50 MCG/ACT nasal spray  Commonly known as:  FLONASE  Place 1 spray into both nostrils daily.     ibuprofen 600 MG tablet  Commonly known as:  ADVIL,MOTRIN  Take 1 tablet (600 mg total) by mouth every 6 (six) hours as needed (mild pain).     ipratropium-albuterol 0.5-2.5 (3) MG/3ML Soln  Commonly known as:  DUONEB  Take 3 mLs by nebulization every 6 (six) hours as needed.     levothyroxine 50 MCG tablet  Commonly known as:  SYNTHROID, LEVOTHROID  Take 50 mcg by mouth daily before breakfast.     mometasone-formoterol 200-5 MCG/ACT Aero  Commonly known as:  DULERA  Inhale 2 puffs then rinse mouth, twice daily- maintenance     montelukast 10 MG tablet  Commonly known as:  SINGULAIR  Take 1 tablet (10 mg total) by mouth at bedtime.     multivitamin with minerals Tabs tablet  Take 1 tablet by mouth every morning.     OPTICHAMBER DIAMOND Misc     OVER THE COUNTER MEDICATION  - Place 1 application onto the skin as needed. Essential oil therapy  - Rosemary, Eucaliptus, Stress Away     oxyCODONE-acetaminophen 5-325 MG per tablet  Commonly known as:  PERCOCET/ROXICET  Take 1-2 tablets by mouth every 4 (four) hours as needed for severe pain (moderate to severe pain (when tolerating fluids)).     promethazine 25 MG tablet  Commonly known as:  PHENERGAN  Take 1 tablet (25 mg total) by mouth every 6 (six) hours as needed for nausea or vomiting.     vitamin B-12 1000 MCG tablet  Commonly known as:  CYANOCOBALAMIN  Take 1,000  mcg by mouth every morning.     vitamin C 500 MG tablet  Commonly known as:  ASCORBIC ACID  Take 1,000 mg by mouth every morning.           Follow-up Information   Follow up with Amundson de Berton Lan, MD In 1 week.   Specialty:  Obstetrics and Gynecology   Contact information:   [address] [place] [address] [phone]       Signed: Tacy Learn 06/02/2014, 5:09 PM

## 2014-06-05 ENCOUNTER — Telehealth: Payer: Self-pay | Admitting: Obstetrics and Gynecology

## 2014-06-05 NOTE — Telephone Encounter (Signed)
Spoke with patient. Advised patient that I spoke with Dr.Lathrop who recommends patient come in tomorrow for evaluation with Dr.Silva. Patient is agreeable. Patient denies fevers or vomiting. States that pain pills make her a little nauseated. Patient requesting early afternoon appointment tomorrow. Appointment scheduled for tomorrow at [facility] with Dr.Silva. Patient agreeable and verbalizes understanding.  Routing to Dr.Lathrop as covering Cc: Dr.Silva  Routing to provider for final review. Patient agreeable to disposition. Will close encounter

## 2014-06-05 NOTE — Telephone Encounter (Signed)
Spoke with patient. Patient states that she had surgery on 9/15 and is still taking pain medication. Patient fell on 9/27 and hit the corner of a table. Table hit incision on patient's right side and she has now been experiencing more pain in that side. States that her incision looks "Normal and just like it has. Everything externally looks good. I don't know about internally though or if I need to get it checked." Denies swelling of the area, signs of infection, or damage to incision. Advised patient will need to speak with covering provider as Dr.Silva is out of the office and give her a call back with further recommendations and instructions. Patient is agreeable.

## 2014-06-05 NOTE — Telephone Encounter (Signed)
Patient fell and hit incision on corner of table on 06/03/14. Patient wants to check in with nurse to be sure she does not need to be seen.

## 2014-06-06 ENCOUNTER — Encounter: Payer: Self-pay | Admitting: Obstetrics and Gynecology

## 2014-06-06 ENCOUNTER — Ambulatory Visit (INDEPENDENT_AMBULATORY_CARE_PROVIDER_SITE_OTHER): Payer: BC Managed Care – PPO | Admitting: Obstetrics and Gynecology

## 2014-06-06 VITALS — BP 140/84 | HR 88 | Ht 62.0 in | Wt 181.2 lb

## 2014-06-06 DIAGNOSIS — Z9181 History of falling: Secondary | ICD-10-CM

## 2014-06-06 MED ORDER — ONDANSETRON HCL 4 MG PO TABS: 4.0000 mg | ORAL_TABLET | Freq: Three times a day (TID) | ORAL | Status: DC | PRN

## 2014-06-06 MED ORDER — TRAMADOL HCL 50 MG PO TABS: 50.0000 mg | ORAL_TABLET | Freq: Four times a day (QID) | ORAL | Status: DC | PRN

## 2014-06-06 NOTE — Progress Notes (Signed)
Patient ID: Destiny Sharp, female   DOB: 08-09-1971, 43 y.o.   MRN: [id number] GYNECOLOGY VISIT  PCP:   Referring provider:   HPI: 43 y.o.   Single  Caucasian  female   G0P0 with Patient's last menstrual period was 04/08/2014.   here for  Incision check after falling 2 days ago. Fell against the kitchen table. Walking at home.  Using a walking stick.  Having pain.  No bleeding from the incision.  Some nausea, no vomiting.  This occurring since the surgery.  Taking phenergan one to twice a day.  No fevers.  Voiding well.  Miralax helping to have bowel movements.   GYNECOLOGIC HISTORY: Patient's last menstrual period was 04/08/2014. Sexually active:  no Partner preference:  Contraception:  Abstinence/R-TLH/BSO  Menopausal hormone therapy: None DES exposure:   no Blood transfusions: no   Sexually transmitted diseases:  no  GYN procedures and prior surgeries: T-TLH/BSO/Cystoscopy  Last mammogram: 02-05-14 wnl:[facility]                 Last pap and high risk HPV testing:   03-16-14 wnl:neg HR HPV History of abnormal pap smear:  no   OB History   Grav Para Term Preterm Abortions TAB SAB Ect Mult Living   0                LIFESTYLE: Exercise:               Tobacco: no Alcohol:   no Drug use:  no  Patient Active Problem List   Diagnosis Date Noted  . Status post laparoscopic hysterectomy 05/22/2014  . Vasovagal attack 03/07/2014  . Obstructive sleep apnea 01/17/2013  . Abnormal CT of the chest 10/30/2012  . Chest pain 10/07/2012  . Hypothyroidism   . Hypertension   . SOB (shortness of breath)   . Fatigue   . Edema     Past Medical History  Diagnosis Date  . Hypothyroidism   . Edema     LLE  . Hyperlipidemia     diet controlled - no med  . Vasovagal syncope     one episode only  . Fibromyalgia   . Scoliosis     history  . Hx of cardiovascular stress test 11/22/12    Lex MV 2/14: EF 81%, no ischemia  . Asthma   . Pneumonia     hx  . Bronchitis      one or two times/year  . Sleep apnea     Does not use CPAP every night  . Headache(784.0)     otc med prn  . Anxiety     no meds  . PONV (postoperative nausea and vomiting)     Past Surgical History  Procedure Laterality Date  . Appendectomy    . Nasal sinus surgery      x 3  . Mouth surgery      wisdom teeth ext  . Robotic assisted total hysterectomy with bilateral salpingo oopherectomy N/A 05/22/2014    Procedure: ROBOTIC ASSISTED TOTAL HYSTERECTOMY WITH BILATERAL SALPINGO OOPHORECTOMY and cystoscopy;  Surgeon: Jamey Reas de Berton Lan, MD;  Location: [facility];  Service: Gynecology;  Laterality: N/A;  . Robotic assisted laparoscopic lysis of adhesion Bilateral 05/22/2014    Procedure: XI ROBOTIC ASSISTED LAPAROSCOPIC LYSIS OF extensive ADHESION;  Surgeon: Jamey Reas de Berton Lan, MD;  Location: [facility];  Service: Gynecology;  Laterality: Bilateral;  . Cystoscopy  05/22/2014  Procedure: CYSTOSCOPY;  Surgeon: Jamey Reas de Berton Lan, MD;  Location: [facility];  Service: Gynecology;;    Current Outpatient Prescriptions  Medication Sig Dispense Refill  . albuterol (PROVENTIL HFA;VENTOLIN HFA) 108 (90 BASE) MCG/ACT inhaler Inhale 2 puffs into the lungs every 4 (four) hours as needed for wheezing or shortness of breath.  1 Inhaler  prn  . calcium carbonate (OS-CAL - DOSED IN MG OF ELEMENTAL CALCIUM) 1250 MG tablet Take 1 tablet by mouth daily.      . DENTA 5000 PLUS 1.1 % CREA dental cream       . fluticasone (FLONASE) 50 MCG/ACT nasal spray Place 1 spray into both nostrils daily.      Marland Kitchen ibuprofen (ADVIL,MOTRIN) 600 MG tablet Take 1 tablet (600 mg total) by mouth every 6 (six) hours as needed (mild pain).  30 tablet  0  . ipratropium-albuterol (DUONEB) 0.5-2.5 (3) MG/3ML SOLN Take 3 mLs by nebulization every 6 (six) hours as needed.  360 mL  prn  . levothyroxine (SYNTHROID, LEVOTHROID) 50 MCG tablet Take 50 mcg by mouth daily before breakfast.       .  mometasone-formoterol (DULERA) 200-5 MCG/ACT AERO Inhale 2 puffs then rinse mouth, twice daily- maintenance  1 Inhaler  prn  . montelukast (SINGULAIR) 10 MG tablet Take 1 tablet (10 mg total) by mouth at bedtime.  30 tablet  11  . Multiple Vitamin (MULTIVITAMIN WITH MINERALS) TABS Take 1 tablet by mouth every morning.       . Nebulizers (COMPRESSOR/NEBULIZER) MISC Use as directed for asthma  1 each  prn  . Omega-3 Fatty Acids (FISH OIL PO) Take 2,400 mg by mouth daily.      Marland Kitchen oxyCODONE-acetaminophen (PERCOCET/ROXICET) 5-325 MG per tablet Take 1-2 tablets by mouth every 4 (four) hours as needed for severe pain (moderate to severe pain (when tolerating fluids)).  30 tablet  0  . promethazine (PHENERGAN) 25 MG tablet Take 1 tablet (25 mg total) by mouth every 6 (six) hours as needed for nausea or vomiting.  10 tablet  0  . Spacer/Aero-Holding [place] ([place]) MISC       . vitamin B-12 (CYANOCOBALAMIN) 1000 MCG tablet Take 1,000 mcg by mouth every morning.       . vitamin C (ASCORBIC ACID) 500 MG tablet Take 1,000 mg by mouth every morning.       Marland Kitchen estradiol (VIVELLE-DOT) 0.1 MG/24HR patch Place 1 patch (0.1 mg total) onto the skin 2 (two) times a week.  8 patch  2  . OVER THE COUNTER MEDICATION Place 1 application onto the skin as needed. Essential oil therapy Rosemary, Eucaliptus, Stress Away       No current facility-administered medications for this visit.     ALLERGIES: Codeine; Hepatitis b virus vaccine; Imitrex; and Pneumococcal vaccines  Family History  Problem Relation Age of Onset  . CAD Maternal Grandmother   . Ovarian cancer Mother 19    ovarian  . Emphysema Maternal Uncle   . Heart disease Maternal Grandfather   . Heart disease Paternal Grandfather   . Heart disease Paternal Grandmother   . Heart disease Father     History   Social History  . Marital Status: Single    Spouse Name: N/A    Number of Children: 0  . Years of Education: N/A   Occupational  History  .  [employer]   Social History Main Topics  . Smoking  status: Never Smoker   . Smokeless tobacco: Never Used  . Alcohol Use: No  . Drug Use: No  . Sexual Activity: No     Comment: R-TLH/BSO   Other Topics Concern  . Not on file   Social History Narrative  . No narrative on file    ROS:  Pertinent items are noted in HPI.  PHYSICAL EXAMINATION:    BP 140/84  Pulse 88  Ht 5\' 2"  (1.575 m)  Wt 181 lb 3.2 oz (82.192 kg)  BMI 33.13 kg/m2  LMP 04/08/2014   Wt Readings from Last 3 Encounters:  06/06/14 181 lb 3.2 oz (82.192 kg)  05/30/14 180 lb (81.647 kg)  05/23/14 183 lb (83.008 kg)     Ht Readings from Last 3 Encounters:  06/06/14 5\' 2"  (1.575 m)  05/30/14 5\' 2"  (1.575 m)  05/23/14 5\' 2"  (1.575 m)    General appearance: alert, cooperative and appears stated age   Abdomen: no ecchymoses, incisions intact and without hernia formation, soft, non-tender; no masses,  no organomegaly   Pelvic: External genitalia:  no lesions              Urethra:  normal appearing urethra with no masses, tenderness or lesions              Bartholins and Skenes: normal                 Vagina: normal appearing vagina with normal color and discharge, no lesions, cuff intact.               Cervix:  absent                 Bimanual Exam:  Uterus:   absent                                      Adnexa: normal adnexa in size, nontender and no masses                                     ASSESSMENT  Status post robotic hysterectomy with BSO and LOA.  Doing well.  Status post fall.  No acute injury noted.  Nausea with Percocet.   PLAN  Stop Percocet and Phenergan.  Tramadol for pain prn.  See orders.  OK to continue with Ibuprofen.  Stop using walking stick - no rubber on the bottom.  Use shoes with rubber on bottom.  Will start estrogen this weekend.  Keep appointment for 6 week check.   An After Visit Summary was printed and given to the  patient.

## 2014-06-11 ENCOUNTER — Telehealth: Payer: Self-pay | Admitting: Obstetrics and Gynecology

## 2014-06-11 ENCOUNTER — Encounter: Payer: Self-pay | Admitting: Obstetrics and Gynecology

## 2014-06-11 ENCOUNTER — Inpatient Hospital Stay (HOSPITAL_COMMUNITY)
Admission: AD | Admit: 2014-06-11 | Discharge: 2014-06-11 | Disposition: A | Discharge status: Home / Self Care | Payer: BC Managed Care – PPO | Source: Ambulatory Visit | Attending: Obstetrics and Gynecology | Admitting: Obstetrics and Gynecology

## 2014-06-11 ENCOUNTER — Inpatient Hospital Stay (HOSPITAL_COMMUNITY): Payer: BC Managed Care – PPO

## 2014-06-11 ENCOUNTER — Ambulatory Visit (INDEPENDENT_AMBULATORY_CARE_PROVIDER_SITE_OTHER): Payer: BC Managed Care – PPO | Admitting: Obstetrics and Gynecology

## 2014-06-11 VITALS — BP 136/82 | HR 72 | Ht 62.0 in | Wt 181.0 lb

## 2014-06-11 DIAGNOSIS — R109 Unspecified abdominal pain: Secondary | ICD-10-CM | POA: Diagnosis present

## 2014-06-11 DIAGNOSIS — G8918 Other acute postprocedural pain: Secondary | ICD-10-CM

## 2014-06-11 DIAGNOSIS — R1084 Generalized abdominal pain: Secondary | ICD-10-CM

## 2014-06-11 DIAGNOSIS — N390 Urinary tract infection, site not specified: Secondary | ICD-10-CM | POA: Insufficient documentation

## 2014-06-11 DIAGNOSIS — R112 Nausea with vomiting, unspecified: Secondary | ICD-10-CM | POA: Insufficient documentation

## 2014-06-11 LAB — CBC WITH DIFFERENTIAL/PLATELET
BASOS ABS: 0 10*3/uL (ref 0.0–0.1)
Basophils Relative: 0 % (ref 0–1)
EOS ABS: 0.2 10*3/uL (ref 0.0–0.7)
EOS PCT: 5 % (ref 0–5)
HEMATOCRIT: 33.9 % — AB (ref 36.0–46.0)
Hemoglobin: 12.2 g/dL (ref 12.0–15.0)
Lymphocytes Relative: 33 % (ref 12–46)
Lymphs Abs: 1.8 10*3/uL (ref 0.7–4.0)
MCH: 32 pg (ref 26.0–34.0)
MCHC: 36 g/dL (ref 30.0–36.0)
MCV: 89 fL (ref 78.0–100.0)
MONO ABS: 0.4 10*3/uL (ref 0.1–1.0)
Monocytes Relative: 7 % (ref 3–12)
Neutro Abs: 3 10*3/uL (ref 1.7–7.7)
Neutrophils Relative %: 55 % (ref 43–77)
Platelets: 252 10*3/uL (ref 150–400)
RBC: 3.81 MIL/uL — ABNORMAL LOW (ref 3.87–5.11)
RDW: 13.2 % (ref 11.5–15.5)
WBC: 5.4 10*3/uL (ref 4.0–10.5)

## 2014-06-11 LAB — URINALYSIS, ROUTINE W REFLEX MICROSCOPIC
Bilirubin Urine: NEGATIVE
Glucose, UA: NEGATIVE mg/dL
HGB URINE DIPSTICK: NEGATIVE
Ketones, ur: NEGATIVE mg/dL
Nitrite: NEGATIVE
Protein, ur: NEGATIVE mg/dL
SPECIFIC GRAVITY, URINE: 1.015 (ref 1.005–1.030)
Urobilinogen, UA: 0.2 mg/dL (ref 0.0–1.0)
pH: 7 (ref 5.0–8.0)

## 2014-06-11 LAB — COMPREHENSIVE METABOLIC PANEL
ALT: 46 U/L — AB (ref 0–35)
AST: 41 U/L — AB (ref 0–37)
Albumin: 4.2 g/dL (ref 3.5–5.2)
Alkaline Phosphatase: 82 U/L (ref 39–117)
Anion gap: 15 (ref 5–15)
BUN: 15 mg/dL (ref 6–23)
CO2: 22 meq/L (ref 19–32)
CREATININE: 0.72 mg/dL (ref 0.50–1.10)
Calcium: 9.6 mg/dL (ref 8.4–10.5)
Chloride: 103 mEq/L (ref 96–112)
GFR calc non Af Amer: >90 mL/min (ref >90)
GLUCOSE: 93 mg/dL (ref 70–99)
Potassium: 4 mEq/L (ref 3.7–5.3)
Sodium: 140 mEq/L (ref 137–147)
TOTAL PROTEIN: 7.2 g/dL (ref 6.0–8.3)
Total Bilirubin: 0.9 mg/dL (ref 0.3–1.2)

## 2014-06-11 LAB — URINE MICROSCOPIC-ADD ON

## 2014-06-11 MED ORDER — IOHEXOL 300 MG/ML  SOLN
100.0000 mL | Freq: Once | INTRAMUSCULAR | Status: AC | PRN
  Administered 2014-06-11: 100 mL via INTRAVENOUS

## 2014-06-11 MED ORDER — IOHEXOL 300 MG/ML  SOLN: 50.0000 mL | INTRAMUSCULAR | Status: AC

## 2014-06-11 MED ORDER — OXYCODONE-ACETAMINOPHEN 5-325 MG PO TABS
1.0000 | ORAL_TABLET | ORAL | Status: DC | PRN
Start: 2014-06-11 — End: 2014-07-02

## 2014-06-11 MED ORDER — IOHEXOL 350 MG/ML SOLN: 100.0000 mL | Freq: Once | INTRAVENOUS | Status: DC | PRN

## 2014-06-11 MED ORDER — CIPROFLOXACIN HCL 500 MG PO TABS: 500.0000 mg | ORAL_TABLET | Freq: Two times a day (BID) | ORAL | Status: DC

## 2014-06-11 MED ORDER — KETOROLAC TROMETHAMINE 30 MG/ML IJ SOLN
30.0000 mg | Freq: Once | INTRAMUSCULAR | Status: AC
  Administered 2014-06-11: 30 mg via INTRAVENOUS
  Filled 2014-06-11: qty 1

## 2014-06-11 MED ORDER — PROMETHAZINE HCL 25 MG PO TABS: 25.0000 mg | ORAL_TABLET | Freq: Four times a day (QID) | ORAL | Status: DC | PRN

## 2014-06-11 MED ORDER — SODIUM CHLORIDE 0.9 % IV SOLN
INTRAVENOUS | Status: DC
  Administered 2014-06-11: 17:00:00 via INTRAVENOUS

## 2014-06-11 MED ORDER — PROMETHAZINE HCL 25 MG/ML IJ SOLN
25.0000 mg | Freq: Once | INTRAMUSCULAR | Status: AC
  Administered 2014-06-11: 25 mg via INTRAVENOUS
  Filled 2014-06-11: qty 1

## 2014-06-11 NOTE — MAU Note (Signed)
Pt had abd hysterectomy on Sep 15 and felt like everything was healing and two days ago the pain became worse and unrelieveded by tramadol.  Pt in tears and needs support walking.  Reports severe pain all across her mid abdomen.  Denies any fever.

## 2014-06-11 NOTE — MAU Provider Note (Signed)
History     CSN: [id number]  Arrival date and time: 06/11/14 1529   First Provider Initiated Contact with Patient 06/11/14 1638      No chief complaint on file.  HPI  Ms Destiny Sharp is a 43 y.o. female G0P0 who presents to MAU with abdominal pain and nausea/vomiting. Nausea and pain started Saturday; symptoms have progressively gotten worse. She presented to Dr. Elza Rafter office today and was sent here for a CT scan.  She had a total hysterectomy and lysis of adhesions on September 15th. She took tramadol at 0930 this morning; minimal relief.   History of appendectomy   OB History   Grav Para Term Preterm Abortions TAB SAB Ect Mult Living   0               Past Medical History  Diagnosis Date  . Hypothyroidism   . Edema     LLE  . Hyperlipidemia     diet controlled - no med  . Vasovagal syncope     one episode only  . Fibromyalgia   . Scoliosis     history  . Hx of cardiovascular stress test 11/22/12    Lex MV 2/14: EF 81%, no ischemia  . Asthma   . Pneumonia     hx  . Bronchitis     one or two times/year  . Sleep apnea     Does not use CPAP every night  . Headache(784.0)     otc med prn  . Anxiety     no meds  . PONV (postoperative nausea and vomiting)     Past Surgical History  Procedure Laterality Date  . Appendectomy    . Nasal sinus surgery      x 3  . Mouth surgery      wisdom teeth ext  . Robotic assisted total hysterectomy with bilateral salpingo oopherectomy N/A 05/22/2014    Procedure: ROBOTIC ASSISTED TOTAL HYSTERECTOMY WITH BILATERAL SALPINGO OOPHORECTOMY and cystoscopy;  Surgeon: Jamey Reas de Berton Lan, MD;  Location: [facility];  Service: Gynecology;  Laterality: N/A;  . Robotic assisted laparoscopic lysis of adhesion Bilateral 05/22/2014    Procedure: XI ROBOTIC ASSISTED LAPAROSCOPIC LYSIS OF extensive ADHESION;  Surgeon: Jamey Reas de Berton Lan, MD;  Location: [facility];  Service: Gynecology;  Laterality: Bilateral;    . Cystoscopy  05/22/2014    Procedure: CYSTOSCOPY;  Surgeon: Jamey Reas de Berton Lan, MD;  Location: [facility];  Service: Gynecology;;    Family History  Problem Relation Age of Onset  . CAD Maternal Grandmother   . Ovarian cancer Mother 66    ovarian  . Emphysema Maternal Uncle   . Heart disease Maternal Grandfather   . Heart disease Paternal Grandfather   . Heart disease Paternal Grandmother   . Heart disease Father     History  Substance Use Topics  . Smoking status: Never Smoker   . Smokeless tobacco: Never Used  . Alcohol Use: No    Allergies:  Allergies  Allergen Reactions  . Codeine Nausea And Vomiting  . Hepatitis B Virus Vaccine Itching  . Imitrex [Sumatriptan] Other (See Comments)    vomitting  . Pneumococcal Vaccines Itching and Swelling    Prescriptions prior to admission  Medication Sig Dispense Refill  . albuterol (PROVENTIL HFA;VENTOLIN HFA) 108 (90 BASE) MCG/ACT inhaler Inhale 2 puffs into the lungs every 4 (four) hours as needed for wheezing or shortness of breath.  1 Inhaler  prn  . calcium carbonate (OS-CAL - DOSED IN MG OF ELEMENTAL CALCIUM) 1250 MG tablet Take 1 tablet by mouth daily.      . DENTA 5000 PLUS 1.1 % CREA dental cream Place 1 application onto teeth at bedtime.       . fluticasone (FLONASE) 50 MCG/ACT nasal spray Place 1 spray into both nostrils daily.      Marland Kitchen ibuprofen (ADVIL,MOTRIN) 600 MG tablet Take 1 tablet (600 mg total) by mouth every 6 (six) hours as needed (mild pain).  30 tablet  0  . ipratropium-albuterol (DUONEB) 0.5-2.5 (3) MG/3ML SOLN Take 3 mLs by nebulization every 6 (six) hours as needed (asthma).      Marland Kitchen levothyroxine (SYNTHROID, LEVOTHROID) 50 MCG tablet Take 50 mcg by mouth daily before breakfast.       . mometasone-formoterol (DULERA) 200-5 MCG/ACT AERO Inhale 2 puffs into the lungs as needed for wheezing (asthma).      . Multiple Vitamin (MULTIVITAMIN WITH MINERALS) TABS Take 1 tablet by mouth every morning.        . Omega-3 Fatty Acids (FISH OIL PO) Take 2,400 mg by mouth daily.      . ondansetron (ZOFRAN) 4 MG tablet Take 1 tablet (4 mg total) by mouth every 8 (eight) hours as needed for nausea or vomiting.  10 tablet  0  . OVER THE COUNTER MEDICATION Place 1 application onto the skin as needed. Essential oil therapy Rosemary, Eucaliptus, Stress Away      . traMADol (ULTRAM) 50 MG tablet Take 50 mg by mouth every 6 (six) hours as needed for moderate pain.      . vitamin B-12 (CYANOCOBALAMIN) 1000 MCG tablet Take 1,000 mcg by mouth every morning.       . vitamin C (ASCORBIC ACID) 500 MG tablet Take 1,000 mg by mouth every morning.       Marland Kitchen estradiol (VIVELLE-DOT) 0.1 MG/24HR patch Place 1 patch (0.1 mg total) onto the skin 2 (two) times a week.  8 patch  2  . Nebulizers (COMPRESSOR/NEBULIZER) MISC Use as directed for asthma  1 each  prn  . Spacer/Aero-Holding [facility] DIAMOND) MISC        Results for orders placed during the hospital encounter of 06/11/14 (from the past 48 hour(s))  URINALYSIS, ROUTINE W REFLEX MICROSCOPIC     Status: Abnormal   Collection Time    06/11/14  3:45 PM      Result Value Ref Range   Color, Urine YELLOW  YELLOW   APPearance HAZY (*) CLEAR   Specific Gravity, Urine 1.015  1.005 - 1.030   pH 7.0  5.0 - 8.0   Glucose, UA NEGATIVE  NEGATIVE mg/dL   Hgb urine dipstick NEGATIVE  NEGATIVE   Bilirubin Urine NEGATIVE  NEGATIVE   Ketones, ur NEGATIVE  NEGATIVE mg/dL   Protein, ur NEGATIVE  NEGATIVE mg/dL   Urobilinogen, UA 0.2  0.0 - 1.0 mg/dL   Nitrite NEGATIVE  NEGATIVE   Leukocytes, UA SMALL (*) NEGATIVE  URINE MICROSCOPIC-ADD ON     Status: Abnormal   Collection Time    06/11/14  3:45 PM      Result Value Ref Range   Squamous Epithelial / LPF FEW (*) RARE   WBC, UA 3-6  <3 WBC/hpf   RBC / HPF 0-2  <3 RBC/hpf   Bacteria, UA MANY (*) RARE   Urine-Other AMORPHOUS URATES/PHOSPHATES    CBC WITH DIFFERENTIAL     Status: Abnormal  Collection Time     06/11/14  4:28 PM      Result Value Ref Range   WBC 5.4  4.0 - 10.5 K/uL   RBC 3.81 (*) 3.87 - 5.11 MIL/uL   Hemoglobin 12.2  12.0 - 15.0 g/dL   HCT 33.9 (*) 36.0 - 46.0 %   MCV 89.0  78.0 - 100.0 fL   MCH 32.0  26.0 - 34.0 pg   MCHC 36.0  30.0 - 36.0 g/dL   RDW 13.2  11.5 - 15.5 %   Platelets 252  150 - 400 K/uL   Neutrophils Relative % 55  43 - 77 %   Neutro Abs 3.0  1.7 - 7.7 K/uL   Lymphocytes Relative 33  12 - 46 %   Lymphs Abs 1.8  0.7 - 4.0 K/uL   Monocytes Relative 7  3 - 12 %   Monocytes Absolute 0.4  0.1 - 1.0 K/uL   Eosinophils Relative 5  0 - 5 %   Eosinophils Absolute 0.2  0.0 - 0.7 K/uL   Basophils Relative 0  0 - 1 %   Basophils Absolute 0.0  0.0 - 0.1 K/uL  COMPREHENSIVE METABOLIC PANEL     Status: Abnormal   Collection Time    06/11/14  4:28 PM      Result Value Ref Range   Sodium 140  137 - 147 mEq/L   Potassium 4.0  3.7 - 5.3 mEq/L   Chloride 103  96 - 112 mEq/L   CO2 22  19 - 32 mEq/L   Glucose, Bld 93  70 - 99 mg/dL   BUN 15  6 - 23 mg/dL   Creatinine, Ser 0.72  0.50 - 1.10 mg/dL   Calcium 9.6  8.4 - 10.5 mg/dL   Total Protein 7.2  6.0 - 8.3 g/dL   Albumin 4.2  3.5 - 5.2 g/dL   AST 41 (*) 0 - 37 U/L   ALT 46 (*) 0 - 35 U/L   Alkaline Phosphatase 82  39 - 117 U/L   Total Bilirubin 0.9  0.3 - 1.2 mg/dL   GFR calc non Af Amer >90  >90 mL/min   GFR calc Af Amer >90  >90 mL/min   Comment: (NOTE)     The eGFR has been calculated using the CKD EPI equation.     This calculation has not been validated in all clinical situations.     eGFR's persistently <90 mL/min signify possible Chronic Kidney     Disease.   Anion gap 15  5 - 15    Ct Abdomen Pelvis W Contrast  06/11/2014   ADDENDUM REPORT: 06/11/2014 20:55  ADDENDUM: Additional history is provided. The patient has had an appendectomy. A small tubular structure best seen on image 60 may represent the residual stump of the appendix.   Electronically Signed   By: Lawrence Santiago M.D.   On: 06/11/2014 20:55    06/11/2014   CLINICAL DATA:  Severe periumbilical pain following hysterectomy 3 weeks ago. Pain along the incision. Initial encounter.  EXAM: CT ABDOMEN AND PELVIS WITH CONTRAST  TECHNIQUE: Multidetector CT imaging of the abdomen and pelvis was performed using the standard protocol following bolus administration of intravenous contrast.  CONTRAST:  154m OMNIPAQUE IOHEXOL 300 MG/ML  SOLN  COMPARISON:  Acute abdominal series 01/12/2011. CT abdomen and pelvis 01/01/2011.  FINDINGS: The lung bases are clear without focal nodule, mass, or airspace disease. The heart size is normal. No significant pleural or  pericardial effusion is present.  Mild diffuse fatty infiltration of the liver is again noted. The stomach, duodenum, and pancreas are within normal limits. The common bile duct and gallbladder are normal. The adrenal glands are normal bilaterally. Kidneys and ureters are within normal limits. The urinary bladder is unremarkable.  The rectosigmoid colon is within normal limits. The remainder the colon is unremarkable. The appendix is visualized and within normal limits. Small bowel is normal as well.  Patient is status post hysterectomy and bilateral oophorectomy. There is no significant fluid collection. The laparotomy appears well-healed. A tiny ventral periumbilical defect is present with some herniated fat but no bowel.  The bone windows demonstrate levoconvex curvature lumbar spine centered at L4-5 with rightward curvature at T11-12.  IMPRESSION: 1. Status post laparotomy for hysterectomy and bilateral oophorectomy. 2. Tiny fat herniation at the level of the umbilicus. 3. Otherwise negative appearance of the abdomen and pelvis. 4. Scoliosis.  Electronically Signed: By: Lawrence Santiago M.D. On: 06/11/2014 19:46      Review of Systems  Constitutional: Negative for fever and chills.  Gastrointestinal: Positive for nausea, vomiting and abdominal pain (All over the middle of her abdomen).   Physical Exam     Blood pressure 107/69, pulse 86, temperature 98.2 F (36.8 C), temperature source Oral, resp. rate 18, last menstrual period 04/08/2014, SpO2 100.00%.  Physical Exam  Constitutional: She is oriented to person, place, and time. She appears well-developed and well-nourished. No distress.  HENT:  Head: Normocephalic.  Eyes: Pupils are equal, round, and reactive to light.  Neck: Neck supple.  Respiratory: Effort normal.  GI: Soft. Normal appearance. There is tenderness in the periumbilical area and suprapubic area. There is no rigidity, no rebound and no guarding. No hernia.    Musculoskeletal: Normal range of motion.  Neurological: She is alert and oriented to person, place, and time.  Skin: Skin is warm. She is not diaphoretic.  Psychiatric: Her behavior is normal.    MAU Course  Procedures None  MDM Toradol 30 mg IV; a second dose of toradol 30 mg IV was given prior to discharge home  Phenergan 25 mg IV  Discussed labs and CT scan with Dr. Quincy Simmonds Urine culture pending Assessment and Plan   A: Post op pain  UTI Tiny fat herniation as seen on CT  P: Discharge home in stable condition RX: cipro, percocet, phenergan Limit activity over the next few days Follow up with Dr. Quincy Simmonds by the end of the week; call to schedule an appointment Return to MAU if symptoms worsen  Darrelyn Hillock Rasch, NP 06/13/2014 8:16 AM\

## 2014-06-11 NOTE — Progress Notes (Signed)
GYNECOLOGY VISIT  PCP:   Referring provider:   HPI: 43 y.o.   Single  Caucasian  female   G0P0 with Patient's last menstrual period was 04/08/2014.   here for   Abdominal Pain Status post robotic total laparoscopic hysterectomy with bilateral salpingo-oophorectomy and LOA 05/22/14. Pain started 2 days ago. Is having constant pain but it pinches.  Going across her abdomen.  Last bowel movement this am, did not help the pain.  Voiding well.  No dysuria.  No hematuria. Some nausea with the pain.  No vomiting. No fevers, shakes, or chills.  No vaginal bleeding.   Has been walking up and back driveway several times a day.  Has been standing and working on paper work.    GYNECOLOGIC HISTORY: Patient's last menstrual period was 04/08/2014. Sexually active:  no Partner preference:female  Contraception:   no Menopausal hormone therapy: estradiol patches DES exposure:   no Blood transfusions:no    Sexually transmitted diseases: no   GYN procedures and prior surgeries: hysterectomy, Laparoscopic, cystoscopy Last mammogram:    02/05/14 Bi-Rads Neg             Last pap and high risk HPV testing:   03/16/14 neg HR HPV History of abnormal pap smear:  no   OB History   Grav Para Term Preterm Abortions TAB SAB Ect Mult Living   0                LIFESTYLE: Exercise:  no             Tobacco: no Alcohol:nono Drug use:    Patient Active Problem List   Diagnosis Date Noted  . Status post laparoscopic hysterectomy 05/22/2014  . Vasovagal attack 03/07/2014  . Obstructive sleep apnea 01/17/2013  . Abnormal CT of the chest 10/30/2012  . Chest pain 10/07/2012  . Hypothyroidism   . Hypertension   . SOB (shortness of breath)   . Fatigue   . Edema     Past Medical History  Diagnosis Date  . Hypothyroidism   . Edema     LLE  . Hyperlipidemia     diet controlled - no med  . Vasovagal syncope     one episode only  . Fibromyalgia   . Scoliosis     history  . Hx of cardiovascular  stress test 11/22/12    Lex MV 2/14: EF 81%, no ischemia  . Asthma   . Pneumonia     hx  . Bronchitis     one or two times/year  . Sleep apnea     Does not use CPAP every night  . Headache(784.0)     otc med prn  . Anxiety     no meds  . PONV (postoperative nausea and vomiting)     Past Surgical History  Procedure Laterality Date  . Appendectomy    . Nasal sinus surgery      x 3  . Mouth surgery      wisdom teeth ext  . Robotic assisted total hysterectomy with bilateral salpingo oopherectomy N/A 05/22/2014    Procedure: ROBOTIC ASSISTED TOTAL HYSTERECTOMY WITH BILATERAL SALPINGO OOPHORECTOMY and cystoscopy;  Surgeon: Jamey Reas de Berton Lan, MD;  Location: [facility];  Service: Gynecology;  Laterality: N/A;  . Robotic assisted laparoscopic lysis of adhesion Bilateral 05/22/2014    Procedure: XI ROBOTIC ASSISTED LAPAROSCOPIC LYSIS OF extensive ADHESION;  Surgeon: Jamey Reas de Berton Lan, MD;  Location: [facility];  Service:  Gynecology;  Laterality: Bilateral;  . Cystoscopy  05/22/2014    Procedure: CYSTOSCOPY;  Surgeon: Jamey Reas de Berton Lan, MD;  Location: [facility];  Service: Gynecology;;    Current Outpatient Prescriptions  Medication Sig Dispense Refill  . albuterol (PROVENTIL HFA;VENTOLIN HFA) 108 (90 BASE) MCG/ACT inhaler Inhale 2 puffs into the lungs every 4 (four) hours as needed for wheezing or shortness of breath.  1 Inhaler  prn  . calcium carbonate (OS-CAL - DOSED IN MG OF ELEMENTAL CALCIUM) 1250 MG tablet Take 1 tablet by mouth daily.      . DENTA 5000 PLUS 1.1 % CREA dental cream       . estradiol (VIVELLE-DOT) 0.1 MG/24HR patch Place 1 patch (0.1 mg total) onto the skin 2 (two) times a week.  8 patch  2  . fluticasone (FLONASE) 50 MCG/ACT nasal spray Place 1 spray into both nostrils daily.      Marland Kitchen ibuprofen (ADVIL,MOTRIN) 600 MG tablet Take 1 tablet (600 mg total) by mouth every 6 (six) hours as needed (mild pain).  30 tablet  0  .  ipratropium-albuterol (DUONEB) 0.5-2.5 (3) MG/3ML SOLN Take 3 mLs by nebulization every 6 (six) hours as needed.  360 mL  prn  . levothyroxine (SYNTHROID, LEVOTHROID) 50 MCG tablet Take 50 mcg by mouth daily before breakfast.       . mometasone-formoterol (DULERA) 200-5 MCG/ACT AERO Inhale 2 puffs then rinse mouth, twice daily- maintenance  1 Inhaler  prn  . montelukast (SINGULAIR) 10 MG tablet Take 1 tablet (10 mg total) by mouth at bedtime.  30 tablet  11  . Multiple Vitamin (MULTIVITAMIN WITH MINERALS) TABS Take 1 tablet by mouth every morning.       . Nebulizers (COMPRESSOR/NEBULIZER) MISC Use as directed for asthma  1 each  prn  . Omega-3 Fatty Acids (FISH OIL PO) Take 2,400 mg by mouth daily.      . ondansetron (ZOFRAN) 4 MG tablet Take 1 tablet (4 mg total) by mouth every 8 (eight) hours as needed for nausea or vomiting.  10 tablet  0  . OVER THE COUNTER MEDICATION Place 1 application onto the skin as needed. Essential oil therapy Rosemary, Eucaliptus, Stress Away      . Spacer/Aero-Holding Chambers [facility] DIAMOND) MISC       . traMADol (ULTRAM) 50 MG tablet Take 1 tablet (50 mg total) by mouth every 6 (six) hours as needed.  15 tablet  0  . vitamin B-12 (CYANOCOBALAMIN) 1000 MCG tablet Take 1,000 mcg by mouth every morning.       . vitamin C (ASCORBIC ACID) 500 MG tablet Take 1,000 mg by mouth every morning.        No current facility-administered medications for this visit.     ALLERGIES: Codeine; Hepatitis b virus vaccine; Imitrex; and Pneumococcal vaccines  Family History  Problem Relation Age of Onset  . CAD Maternal Grandmother   . Ovarian cancer Mother 68    ovarian  . Emphysema Maternal Uncle   . Heart disease Maternal Grandfather   . Heart disease Paternal Grandfather   . Heart disease Paternal Grandmother   . Heart disease Father     History   Social History  . Marital Status: Single    Spouse Name: N/A    Number of Children: 0  . Years of Education: N/A    Occupational History  .  [email or web address]   Social History Main  Topics  . Smoking status: Never Smoker   . Smokeless tobacco: Never Used  . Alcohol Use: No  . Drug Use: No  . Sexual Activity: No     Comment: R-TLH/BSO   Other Topics Concern  . Not on file   Social History Narrative  . No narrative on file    ROS:  Pertinent items are noted in HPI.  PHYSICAL EXAMINATION:    BP 136/82  Pulse 72  Ht 5\' 2"  (1.575 m)  Wt 181 lb (82.101 kg)  BMI 33.10 kg/m2  LMP 04/08/2014  Temp 98.1. Wt Readings from Last 3 Encounters:  06/11/14 181 lb (82.101 kg)  06/06/14 181 lb 3.2 oz (82.192 kg)  05/30/14 180 lb (81.647 kg)     Ht Readings from Last 3 Encounters:  06/11/14 5\' 2"  (1.575 m)  06/06/14 5\' 2"  (1.575 m)  05/30/14 5\' 2"  (1.575 m)    General appearance: alert, cooperative and appears stated age, tearful. Head: Normocephalic, without obvious abnormality, atraumatic Lungs: clear to auscultation bilaterally Heart: regular rate and rhythm Abdomen: incisions intact, soft, non-tender; no masses,  no organomegaly Extremities:  1+ LE edema bilaterally. Skin: Skin color, texture, turgor normal. No rashes or lesions Lymph nodes: Cervical, supraclavicular, and axillary nodes normal. No abnormal inguinal nodes palpated Neurologic: Grossly normal  Pelvic: External genitalia:  no lesions              Urethra:  normal appearing urethra with no masses, tenderness or lesions              Bartholins and Skenes: normal                 Vagina: cuff intact.              Cervix:  absent                  Bimanual Exam:  Uterus:  Absent.                                      Adnexa: no masses.                                      ASSESSMENT  Abdominal pain post op.  No acute abdomen.   PLAN  To [facility] now for CBC with differential CMP, urine, CT of abdomen and pelvis with and without contrast.. Pain medication there prn.  Patient agrees to  the plan.   An After Visit Summary was printed and given to the patient. 20 minutes face to face time of which over 50% was spent in counseling.

## 2014-06-11 NOTE — Telephone Encounter (Signed)
Thank you for scheduling the appointment with me today.  I will close the encounter.

## 2014-06-11 NOTE — Telephone Encounter (Signed)
Spoke with Dr. Quincy Simmonds, Patient should have appointment today.  Called patient, she is agreeable. Scheduled office visit at 1:45.

## 2014-06-11 NOTE — Telephone Encounter (Signed)
Patient is still having pain after surgery.

## 2014-06-11 NOTE — Telephone Encounter (Signed)
Spoke with patient. She states that she is still having pain in abdominal area near her incision, describes as pinching and radiates all the way across her abdomen. She felt improved initially and did not use new pain medication, Tramadol on Thursday or Friday but on Saturday, patient developed increased pain "it felt like right after surgery". She used tramadol today, and Saturday and Sunday without relief of her pain. States pain is constant and nothing makes it better, even using her essential oils-lavendar and mint.  Patient denies vaginal bleeding or discharge and denies fevers.  She states she is following Dr. Elza Rafter orders for activity, but she has been filing papers.   Advised would discuss with Dr. Quincy Simmonds and return her call.

## 2014-06-12 LAB — URINE CULTURE
Colony Count: NO GROWTH
Culture: NO GROWTH
Special Requests: NORMAL

## 2014-06-13 NOTE — MAU Provider Note (Signed)
Care discussed with me by phone.  I called the radiologist and discussed the CT scan.   He does not believe the patient has herniation of bowel or omentum into the umbilical incision.  He stated that the patient still had a stump of appendix noted when he read the CT scan.  Patient will be treated for a UTI.

## 2014-06-14 ENCOUNTER — Telehealth: Payer: Self-pay | Admitting: Emergency Medicine

## 2014-06-14 NOTE — Telephone Encounter (Signed)
Message left to return call to [place] at [phone].   Will give message from Dr. Quincy Simmonds.   Next post op:  07/02/2014  Time: 3:30 PM With Dr. Quincy Simmonds.

## 2014-06-14 NOTE — Telephone Encounter (Signed)
Message copied by Michele Mcalpine on Thu Jun 14, 2014  9:24 AM ------      Message from: [place], BROOK E      Created: Wed Jun 13, 2014  7:56 PM      Regarding: Please contact patient in follow up to visit to the ER on 10/5       Please contact patient to see how she is doing with her abdominal pain post op.  Seen for urgent visit on 10/5 for pain, and I sent her to the [facility] for further testing.  CT was unremarkable on final reading.  Was treated for a UTI, but culture has come back negative.              Please have her stop the antibiotics.             I suspect she was having post op pain that needed to be treated with stronger pain medication.             She can keep her routine post op visit appointment unless there are other acute issues to address.             Thanks.            Josefa Half, MD ------

## 2014-06-14 NOTE — Telephone Encounter (Signed)
Spoke with patient. She is trying to take medications in a timely manner and overall states she is feeling much improved.  She is advised to stop Cipro and she will.  She will call back with any further concerns, otherwise will keep post op appointment with Dr. Quincy Simmonds as scheduled.  Routing to provider for final review. Patient agreeable to disposition. Will close encounter

## 2014-06-14 NOTE — Telephone Encounter (Signed)
Returning a call to Tracy °

## 2014-06-25 ENCOUNTER — Ambulatory Visit (INDEPENDENT_AMBULATORY_CARE_PROVIDER_SITE_OTHER): Payer: BC Managed Care – PPO | Admitting: Obstetrics and Gynecology

## 2014-06-25 ENCOUNTER — Encounter: Payer: Self-pay | Admitting: Obstetrics and Gynecology

## 2014-06-25 ENCOUNTER — Telehealth: Payer: Self-pay | Admitting: Obstetrics and Gynecology

## 2014-06-25 VITALS — BP 134/80 | HR 100 | Ht 62.0 in | Wt 181.2 lb

## 2014-06-25 DIAGNOSIS — K5900 Constipation, unspecified: Secondary | ICD-10-CM

## 2014-06-25 NOTE — Progress Notes (Signed)
Patient ID: Destiny Sharp, female   DOB: March 01, 1971, 43 y.o.   MRN: [id number] GYNECOLOGY  VISIT   HPI: 43 y.o.   Single  Caucasian  female   G0P0 with Patient's last menstrual period was 04/08/2014.   here for severe constipation--patient thinks she "pulled something" straining to have a bowel movement the other night. Wants to return to work but worried about it.  Drives a bus.  Having some challenges at home with her father.   Had a bowel movement today.  Took Senakot and had a bowel movement.  Pain feels like the patient is being ripped open.  Some nausea due to the pain meds. No vomiting. No fevers.  No dysuria.  Not taking the Vesicare.   Wearing Depends since surgery due to urinary leakage. Has a history of a neurogenic bladder.  Leaks and loses total control of bladder.   Took hydrocodone (had at home).  Takes 1 - 2 tablets per day.  Not taking Tramadol at all.  Takes 1/2 - 1 Percocet per day.  On ibuprofen 600 mg 1 - 2 times per day.  Takes the phenergan every time she takes a narcotic pill.   Using essential oils on her skin - herbal remedies.   CT scan on 105/15 - umremarkable.  Small umbilical fat herniation without bowel involvement.   Urine dip - neg  GYNECOLOGIC HISTORY: Patient's last menstrual period was 04/08/2014. Contraception: hysterectomy  Menopausal hormone therapy: Vivelle-Dot 0.1mg         OB History   Grav Para Term Preterm Abortions TAB SAB Ect Mult Living   0                  Patient Active Problem List   Diagnosis Date Noted  . Status post laparoscopic hysterectomy 05/22/2014  . Vasovagal attack 03/07/2014  . Obstructive sleep apnea 01/17/2013  . Abnormal CT of the chest 10/30/2012  . Chest pain 10/07/2012  . Hypothyroidism   . Hypertension   . SOB (shortness of breath)   . Fatigue   . Edema     Past Medical History  Diagnosis Date  . Hypothyroidism   . Edema     LLE  . Hyperlipidemia     diet controlled - no med  .  Vasovagal syncope     one episode only  . Fibromyalgia   . Scoliosis     history  . Hx of cardiovascular stress test 11/22/12    Lex MV 2/14: EF 81%, no ischemia  . Asthma   . Pneumonia     hx  . Bronchitis     one or two times/year  . Sleep apnea     Does not use CPAP every night  . Headache(784.0)     otc med prn  . Anxiety     no meds  . PONV (postoperative nausea and vomiting)     Past Surgical History  Procedure Laterality Date  . Appendectomy    . Nasal sinus surgery      x 3  . Mouth surgery      wisdom teeth ext  . Robotic assisted total hysterectomy with bilateral salpingo oopherectomy N/A 05/22/2014    Procedure: ROBOTIC ASSISTED TOTAL HYSTERECTOMY WITH BILATERAL SALPINGO OOPHORECTOMY and cystoscopy;  Surgeon: Jamey Reas de Berton Lan, MD;  Location: [facility];  Service: Gynecology;  Laterality: N/A;  . Robotic assisted laparoscopic lysis of adhesion Bilateral 05/22/2014    Procedure: XI ROBOTIC ASSISTED LAPAROSCOPIC LYSIS  OF extensive ADHESION;  Surgeon: Jamey Reas de Berton Lan, MD;  Location: [facility];  Service: Gynecology;  Laterality: Bilateral;  . Cystoscopy  05/22/2014    Procedure: CYSTOSCOPY;  Surgeon: Jamey Reas de Berton Lan, MD;  Location: [facility];  Service: Gynecology;;    Current Outpatient Prescriptions  Medication Sig Dispense Refill  . albuterol (PROVENTIL HFA;VENTOLIN HFA) 108 (90 BASE) MCG/ACT inhaler Inhale 2 puffs into the lungs every 4 (four) hours as needed for wheezing or shortness of breath.  1 Inhaler  prn  . calcium carbonate (OS-CAL - DOSED IN MG OF ELEMENTAL CALCIUM) 1250 MG tablet Take 1 tablet by mouth daily.      . DENTA 5000 PLUS 1.1 % CREA dental cream Place 1 application onto teeth at bedtime.       Marland Kitchen estradiol (VIVELLE-DOT) 0.1 MG/24HR patch Place 1 patch (0.1 mg total) onto the skin 2 (two) times a week.  8 patch  2  . fluticasone (FLONASE) 50 MCG/ACT nasal spray Place 1 spray into both nostrils daily.       Marland Kitchen ibuprofen (ADVIL,MOTRIN) 200 MG tablet Take 200 mg by mouth every 6 (six) hours as needed.      Marland Kitchen ibuprofen (ADVIL,MOTRIN) 600 MG tablet Take 1 tablet (600 mg total) by mouth every 6 (six) hours as needed (mild pain).  30 tablet  0  . ipratropium-albuterol (DUONEB) 0.5-2.5 (3) MG/3ML SOLN Take 3 mLs by nebulization every 6 (six) hours as needed (asthma).      Marland Kitchen levothyroxine (SYNTHROID, LEVOTHROID) 50 MCG tablet Take 50 mcg by mouth daily before breakfast.       . mometasone-formoterol (DULERA) 200-5 MCG/ACT AERO Inhale 2 puffs into the lungs as needed for wheezing (asthma).      . Multiple Vitamin (MULTIVITAMIN WITH MINERALS) TABS Take 1 tablet by mouth every morning.       . Nebulizers (COMPRESSOR/NEBULIZER) MISC Use as directed for asthma  1 each  prn  . Omega-3 Fatty Acids (FISH OIL PO) Take 2,400 mg by mouth daily.      Marland Kitchen OVER THE COUNTER MEDICATION Place 1 application onto the skin as needed. Essential oil therapy Rosemary, Eucaliptus, Stress Away      . oxyCODONE-acetaminophen (PERCOCET/ROXICET) 5-325 MG per tablet Take 1-2 tablets by mouth every 4 (four) hours as needed for severe pain.  15 tablet  0  . vitamin C (ASCORBIC ACID) 500 MG tablet Take 1,000 mg by mouth every morning.       . ondansetron (ZOFRAN) 4 MG tablet Take 1 tablet (4 mg total) by mouth every 8 (eight) hours as needed for nausea or vomiting.  10 tablet  0  . promethazine (PHENERGAN) 25 MG tablet Take 1 tablet (25 mg total) by mouth every 6 (six) hours as needed for nausea or vomiting.  30 tablet  0  . Spacer/Aero-Holding Chambers [facility] DIAMOND) MISC       . traMADol (ULTRAM) 50 MG tablet Take 50 mg by mouth every 6 (six) hours as needed for moderate pain.      . vitamin B-12 (CYANOCOBALAMIN) 1000 MCG tablet Take 1,000 mcg by mouth every morning.        No current facility-administered medications for this visit.     ALLERGIES: Codeine; Hepatitis b virus vaccine; Imitrex; and Pneumococcal  vaccines  Family History  Problem Relation Age of Onset  . CAD Maternal Grandmother   . Ovarian cancer Mother 34    ovarian  . Emphysema  Maternal Uncle   . Heart disease Maternal Grandfather   . Heart disease Paternal Grandfather   . Heart disease Paternal Grandmother   . Heart disease Father     History   Social History  . Marital Status: Single    Spouse Name: N/A    Number of Children: 0  . Years of Education: N/A   Occupational History  .  [employer]   Social History Main Topics  . Smoking status: Never Smoker   . Smokeless tobacco: Never Used  . Alcohol Use: No  . Drug Use: No  . Sexual Activity: No     Comment: R-TLH/BSO   Other Topics Concern  . Not on file   Social History Narrative  . No narrative on file    ROS:  Pertinent items are noted in HPI.  PHYSICAL EXAMINATION:    BP 134/80  Pulse 100  Ht 5\' 2"  (1.575 m)  Wt 181 lb 3.2 oz (82.192 kg)  BMI 33.13 kg/m2  LMP 04/08/2014     General appearance: alert, cooperative and appears stated age Lungs: clear to auscultation bilaterally Heart: regular rate and rhythm Abdomen: vertical midline and scattered laparoscopic incisions intact, soft, non-tender; no masses,  no organomegaly No abnormal inguinal nodes palpated  Pelvic: External genitalia:  no lesions              Urethra:  normal appearing urethra with no masses, tenderness or lesions              Bartholins and Skenes: normal                 Vagina: normal appearing vagina with normal color and discharge, no lesions              Cervix:  absent                   Bimanual Exam:  Uterus absent                                      Adnexa:  no masses                                   ASSESSMENT  Status post robotic hysterectomy with lysis of adhesions.  Abdominal pain.  I believe that this is largely due to constipation and use or narcotics.  Fibromyalgia.   PLAN  Stop all narcotics and phenergan.  Use  Motrin and Tynenol. No work this week.  Will reassess next week.  Try abdominal binder.  Discussed dietary choices for treating constipation. Glycerin suppository prn.    An After Visit Summary was printed and given to the patient.  __35____ minutes face to face time of which over 50% was spent in counseling.

## 2014-06-25 NOTE — Telephone Encounter (Signed)
Spoke with patient. Advised of message as seen below from [place]. Patient is agreeable and verbalizes understanding. Patient would like to come in today for evaluation with Dr.Silva. Appointment scheduled for today at 1pm. Patient agreeable to date and time. Patient would also like Dr.Silva to know that she has been having trouble with urinary frequency. "I have been having to wear depends because I can't make it to the bathroom and it is getting to the point that it is unbearable." Denies pain with urination, burning, fevers, or back pain. Advised patient would send a message to Dr.Silva to let her know and also to make sure she mentions it at her appointment today. Patient is agreeable.  Routing to provider for final review. Patient agreeable to disposition. Will close encounter

## 2014-06-25 NOTE — Telephone Encounter (Signed)
Patient needs to stop all narcotics.  She is having too many side effects from them.  I recommend only Motrin 800 mg po q 8 hours as needed.  A glycerin suppository may relieve the constipation for her very quickly.  Please offer her an appointment for reassessment.  I am now available at 1 pm today or on on Wednesday.

## 2014-06-25 NOTE — Telephone Encounter (Signed)
Pt says she has had a rough weekend has been having a lot of pain in her abdomin where she had her surgery. She is having extreme constipation. Says that it feels like it is has maybe pulled something. Said that she worked extremely hard for two hours this weekend trying to go to the bathroom pt states " i felt like i was going to break the floor".

## 2014-06-25 NOTE — Telephone Encounter (Signed)
Spoke with patient. Patient had surgery on 9/14. Patient is calling to report that she is still having pain in her abdomin. "I have not been over doing walking or exercise. It feels more like muscle pain like my stomach is ripping apart." States that she has been having extreme constipation which she has been taking correctol for 2-3 nights for. Patient has also used a ducolax suppository which did not provide much relief. "It felt like I was going to break the floor I was straining so hard to go." Patient was given cinacox plus by the pharmacist to take twice per day. States that she has had a BM since taking the cinacox and it has helped. Patient is still taking pain medication 1-2 times per day. Has not taken pain medication today. Pain is a 4-5/10 today. Patient is due to return back to work on Friday and states "I am not due to see Dr.Silva again until next Monday and there is no way that I can perform my job safely by Friday. Is there any way that this can be changed to extend the dates?" Advised patient will need to speak with Dr.Silva regarding pain and letter and return call to patient with further advice and recommendations. Patient is agreeable.

## 2014-06-28 ENCOUNTER — Telehealth: Payer: Self-pay | Admitting: Obstetrics and Gynecology

## 2014-06-28 NOTE — Telephone Encounter (Signed)
Pt was calling Destiny Sharp to let her know she are now waiting for insurance to give her employer a call.

## 2014-07-02 ENCOUNTER — Ambulatory Visit (INDEPENDENT_AMBULATORY_CARE_PROVIDER_SITE_OTHER): Payer: BC Managed Care – PPO | Admitting: Obstetrics and Gynecology

## 2014-07-02 ENCOUNTER — Encounter: Payer: Self-pay | Admitting: Obstetrics and Gynecology

## 2014-07-02 VITALS — BP 104/70 | HR 88 | Resp 18 | Ht 62.0 in | Wt 182.0 lb

## 2014-07-02 DIAGNOSIS — Z9889 Other specified postprocedural states: Secondary | ICD-10-CM

## 2014-07-02 NOTE — Progress Notes (Signed)
GYNECOLOGY  VISIT   HPI: 43 y.o.   Single  Caucasian  female   G0P0 with Patient's last menstrual period was 04/08/2014.   here for   Follow-Up Was able to stop the narcotics.  Taking Motrin 600 mg - 800 mg couple of times a day.   Still using a pillow over the abdomen when riding in the car.  Hurts to drive and ride in car.    Supposed to go back to work tomorrow.  Does not feel she can adequately do her job yet due to her surgical recovery.  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  Does manual work to drive, putting on parking brake, just turning the wheel.  Feels she cannot sweep under the seats.  Is currently not able to help children in an emergency at this time due to the quick response time required.   Struggling with bladder control issues.  Restarted Vesicare.   Hot flashes seem controlled on Vivelle Dot.  Has been using only a few days.  GYNECOLOGIC HISTORY: Patient's last menstrual period was 04/08/2014. Contraception: Hysterectomy Menopausal hormone therapy: Estradiol        OB History   Grav Para Term Preterm Abortions TAB SAB Ect Mult Living   0                  Patient Active Problem List   Diagnosis Date Noted  . Status post laparoscopic hysterectomy 05/22/2014  . Vasovagal attack 03/07/2014  . Obstructive sleep apnea 01/17/2013  . Abnormal CT of the chest 10/30/2012  . Chest pain 10/07/2012  . Hypothyroidism   . Hypertension   . SOB (shortness of breath)   . Fatigue   . Edema     Past Medical History  Diagnosis Date  . Hypothyroidism   . Edema     LLE  . Hyperlipidemia     diet controlled - no med  . Vasovagal syncope     one episode only  . Fibromyalgia   . Scoliosis     history  . Hx of cardiovascular stress test 11/22/12    Lex MV 2/14: EF 81%, no ischemia  . Asthma   . Pneumonia     hx  . Bronchitis     one or two times/year  . Sleep apnea     Does not use CPAP every night  . Headache(784.0)     otc med prn  . Anxiety      no meds  . PONV (postoperative nausea and vomiting)     Past Surgical History  Procedure Laterality Date  . Appendectomy    . Nasal sinus surgery      x 3  . Mouth surgery      wisdom teeth ext  . Robotic assisted total hysterectomy with bilateral salpingo oopherectomy N/A 05/22/2014    Procedure: ROBOTIC ASSISTED TOTAL HYSTERECTOMY WITH BILATERAL SALPINGO OOPHORECTOMY and cystoscopy;  Surgeon: Jamey Reas de Berton Lan, MD;  Location: [facility];  Service: Gynecology;  Laterality: N/A;  . Robotic assisted laparoscopic lysis of adhesion Bilateral 05/22/2014    Procedure: XI ROBOTIC ASSISTED LAPAROSCOPIC LYSIS OF extensive ADHESION;  Surgeon: Jamey Reas de Berton Lan, MD;  Location: [facility];  Service: Gynecology;  Laterality: Bilateral;  . Cystoscopy  05/22/2014    Procedure: CYSTOSCOPY;  Surgeon: Jamey Reas de Berton Lan, MD;  Location: [facility];  Service: Gynecology;;    Current Outpatient Prescriptions  Medication  Sig Dispense Refill  . albuterol (PROVENTIL HFA;VENTOLIN HFA) 108 (90 BASE) MCG/ACT inhaler Inhale 2 puffs into the lungs every 4 (four) hours as needed for wheezing or shortness of breath.  1 Inhaler  prn  . calcium carbonate (OS-CAL - DOSED IN MG OF ELEMENTAL CALCIUM) 1250 MG tablet Take 1 tablet by mouth daily.      . DENTA 5000 PLUS 1.1 % CREA dental cream Place 1 application onto teeth at bedtime.       Marland Kitchen estradiol (VIVELLE-DOT) 0.1 MG/24HR patch Place 1 patch (0.1 mg total) onto the skin 2 (two) times a week.  8 patch  2  . fluticasone (FLONASE) 50 MCG/ACT nasal spray Place 1 spray into both nostrils daily.      Marland Kitchen ibuprofen (ADVIL,MOTRIN) 200 MG tablet Take 200 mg by mouth every 6 (six) hours as needed.      Marland Kitchen ibuprofen (ADVIL,MOTRIN) 600 MG tablet Take 1 tablet (600 mg total) by mouth every 6 (six) hours as needed (mild pain).  30 tablet  0  . ipratropium-albuterol (DUONEB) 0.5-2.5 (3) MG/3ML SOLN Take 3 mLs by nebulization every 6 (six) hours  as needed (asthma).      Marland Kitchen levothyroxine (SYNTHROID, LEVOTHROID) 50 MCG tablet Take 50 mcg by mouth daily before breakfast.       . mometasone-formoterol (DULERA) 200-5 MCG/ACT AERO Inhale 2 puffs into the lungs as needed for wheezing (asthma).      . Multiple Vitamin (MULTIVITAMIN WITH MINERALS) TABS Take 1 tablet by mouth every morning.       . Nebulizers (COMPRESSOR/NEBULIZER) MISC Use as directed for asthma  1 each  prn  . Omega-3 Fatty Acids (FISH OIL PO) Take 2,400 mg by mouth daily.      Marland Kitchen OVER THE COUNTER MEDICATION Place 1 application onto the skin as needed. Essential oil therapy Rosemary, Eucaliptus, Stress Away      . Spacer/Aero-Holding Chambers ([place]) MISC       . vitamin B-12 (CYANOCOBALAMIN) 1000 MCG tablet Take 1,000 mcg by mouth every morning.       . vitamin C (ASCORBIC ACID) 500 MG tablet Take 1,000 mg by mouth every morning.        No current facility-administered medications for this visit.     ALLERGIES: Codeine; Hepatitis b virus vaccine; Imitrex; and Pneumococcal vaccines  Family History  Problem Relation Age of Onset  . CAD Maternal Grandmother   . Ovarian cancer Mother 7    ovarian  . Emphysema Maternal Uncle   . Heart disease Maternal Grandfather   . Heart disease Paternal Grandfather   . Heart disease Paternal Grandmother   . Heart disease Father     History   Social History  . Marital Status: Single    Spouse Name: N/A    Number of Children: 0  . Years of Education: N/A   Occupational History  .  [employer]   Social History Main Topics  . Smoking status: Never Smoker   . Smokeless tobacco: Never Used  . Alcohol Use: No  . Drug Use: No  . Sexual Activity: No     Comment: R-TLH/BSO   Other Topics Concern  . Not on file   Social History Narrative  . No narrative on file    ROS:  Pertinent items are noted in HPI.  PHYSICAL EXAMINATION:    BP 104/70  Pulse 88  Resp 18  Ht 5\' 2"   (1.575 m)  Wt 182 lb (82.555 kg)  BMI 33.28 kg/m2  LMP 04/08/2014     General appearance: alert, cooperative and appears stated age Abdomen: incisions intact, soft, non-tender; no masses,  no organomegaly  Pelvic: External genitalia:  no lesions              Urethra:  normal appearing urethra with no masses, tenderness or lesions              Bartholins and Skenes: normal                 Vagina: normal appearing vagina with normal color and discharge, no lesions              Cervix: absent                   Bimanual Exam:  Uterus:   absent                                      Adnexa:  no masses                                    ASSESSMENT  Status post robotic hysterectomy with bilateral salpingo-oophorectomy.  Menopausal symptoms.  On transdermal estrogen.  Urinary incontinence.  Restarted Vesicare.  PLAN  Will need to additional weeks of recovery before returning to work due to the physical demands of her work. Return in 3 months for ERT recheck.  I reminded patient that it takes several weeks to respond to medications for overactive bladder.  Patient was off perioperatively.  I encouraged her to return to her urologist if she is not having an adequate response.  May be a candidate for PTNS, Interstim, or Botox injections.   An After Visit Summary was printed and given to the patient.  _20_____ minutes face to face time of which over 50% was spent in counseling.

## 2014-07-06 ENCOUNTER — Encounter: Payer: Self-pay | Admitting: Internal Medicine

## 2014-07-06 ENCOUNTER — Ambulatory Visit (INDEPENDENT_AMBULATORY_CARE_PROVIDER_SITE_OTHER): Payer: BC Managed Care – PPO | Admitting: Internal Medicine

## 2014-07-06 VITALS — BP 126/74 | HR 85 | Ht 62.5 in | Wt 187.0 lb

## 2014-07-06 DIAGNOSIS — J302 Other seasonal allergic rhinitis: Secondary | ICD-10-CM

## 2014-07-06 DIAGNOSIS — J3089 Other allergic rhinitis: Secondary | ICD-10-CM

## 2014-07-06 DIAGNOSIS — J309 Allergic rhinitis, unspecified: Secondary | ICD-10-CM

## 2014-07-06 DIAGNOSIS — G4733 Obstructive sleep apnea (adult) (pediatric): Secondary | ICD-10-CM

## 2014-07-06 NOTE — Patient Instructions (Signed)
Consider the environmental dust and pollen control measures we discussed  Please call as needed

## 2014-07-06 NOTE — Progress Notes (Signed)
01/05/13- 71 yoF never smoker transferring from Dr Melvyn Novas. Self referred- Father my pt, is here. Former [facility] patient for pulmonary-Increased SOB, chest pain and tightness.  Hx Cardiac w/u with clear coronaries on cath around 2006, no ischemia on nuc study, Nl EF w/ Gr II diastolic dysfunction, no pulmonary hypertension.  Office spirometry 10/27/12- FVC2.32/ 71%, FEV1 2.02/ 73%, FEV1/FVC 0.87, FEF 25-75% 94% CT chest 2/5 /14: IMPRESSION:  Negative for significant acute pulmonary embolus by CTA.  Low volume exam with scattered diffuse bilateral patchy ground-  glass attenuation which could represent early edema/volume overload  or mild alveolitis.  Original Report Authenticated By: Jerilynn Mages. Annamaria Boots, M.D .Main concern for this visit:  Obstructive Sleep apnea Daytime sleepiness, snoring. Wakes tired.  NPSG- 12/15/10- Dr Jaynie [facility]- RDI 6.8/ hr. CPAP 10,, [facility], full face mask, humidifier Bedtime 10PM-MN, short latency, no WASO, up 5:00-5:30 AM. Describes easy DOE. Leaning on cart in store. Hx of pneumonia or bronchitis about 1x/ year. Pneumovax caused large local reaction. Denies hx of asthma. Some pollen rhinitis in Spring. Hx chest pain in January, 2014 at sternum, extending L arm, cleared by cardiology and dx'd w/ pancreatitis.  Says she is stressed by her job.  01/24/13- 69 yoF never smoker transfer from Dr Melvyn Novas.  Dyspnea, OSA, abnormal CT  Self referred-  FOLLOWS FOR: recent ED visit for Chest pain and  Pt states she continues to have unexplained SOB and states her pulse keeps going up(92 last night at dinner when she experienced the SOB). Today reports "better than usual". Episodic palpitation and substernal soreness. Went to [facility] to get CPAP adjusted. They noted her labored breathing and referred her here. Denies feeling anxious. Any sustained walking -labored breathing. CPAP 10, compliant,  Download suggests 15 cwp. Avg use 3hrs 53 minutes. ECHO 2/44/01- Gr 2 diastolic  dysfunction/CHF, no PHTN Has appointment at [facility] for f/u previous question of pancreatitis, because she had an elevated lipase.  ABG 10/12/12 (? FiO2), pH 7.609, PCO2 20.3, PO2 122, HCO3 20.5- consistent w/ acute respiratory alkalosis. BMET 01/17/13- Anion Gap 12 CT chest 01/17/13- reviewed w/ her IMPRESSION:  No acute findings. No evidence of pulmonary embolus.  Original Report Authenticated By: Rolm Baptise, M.D.  02/03/13- 24 yoF never smoker transfer from Dr Melvyn Novas.  Dyspnea, OSA, abnormal CT  Self referred-  SOB. Pt had PFT and SMW. Pt reports breathing is getting worse. She has had several "coughing fits" in the past couple days, wheezing and chest tx. Father here. Denies routine headache, double vision, numbness. Dr. Greta Doom manages her thyroid. Had neurologic evaluation by Dr Brett Fairy. Migraine evaluation by Dr Catalina Gravel. GI evaluation at [facility].  PFT 02/03/13- severe restriction of total lung capacity, normal spirometry flows for volume with insignificant response to bronchodilator, diffusion moderately reduced. TLC 49%, DLCO 62%. FVC 2.19/62%, FEV1 1.95/68%, FEV1/FVC 89/108%, FEF 25-75% 88/104%. 6 minute walk test 02/03/2013-98%, 99%, 99%, 303 m. Methacholine inhalation challenge test 01/27/2013 positive for hyperreactive airways at stage I.0  02/20/13- 42 yoF never smoker followed for asthma, episodic dyspnea, restrictive PFT,  hyperventilation, OSA, anxiety  Hx abnormal CT School year is over so she is less stressed. Says breathing has been better. Has not needed Librax. Easy exertional dyspnea makes her afraid to try activities. Likes CPAP 10/ [facility].  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  Out of [facility] sample. Shows pictures she says demonstrate mold in her bus, which has been sent for cleaning.  CPAP 10/ [facility]- thinks this is doing well, used every night.  07/11/13- 2 yoF never smoker followed for asthma, episodic dyspnea, restrictive PFT,  hyperventilation, OSA, anxiety  Hx abnormal CT FOLLOWS FOR: patient states she is dong alot better except when doing up hill things.  Has had flu vaccine. Seeing a chiropractor for pulled muscle in her back. 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 Good compliance and control with CPAP 10/ [facility]icare  11/27/13-  53 yoF never smoker followed for asthma, episodic dyspnea, restrictive PFT,  hyperventilation, OSA, anxiety  Hx abnormal CT FOLLOWS FOR: Feb 2015 had PNA, flu, and sinus infection all together. SOB has not been bad since then. Good compliance and control with CPAP 10/ [facility] Still drives school bus. Worked through sinusitis/ Flu/?pneumonia- treated with augmentin, Z pak, cephalexin, augmentin again, then avelox. Now "doiung a little bit better". Finishes avelox tomorrow. Off Dulera for now. No recent wheeze.   03/07/14- 43 yoF never smoker followed for asthma, episodic dyspnea, restrictive PFT,  hyperventilation, OSA, anxiety  Hx abnormal CT Sinus bx 5/4//15- benigngh syrups, or OTC sleep aids in past 3 days. Allergy skin test 03/07/14- significant positive intradermal tests chronic sinusitis No antihistamines, OTC cou for grass, weed and tree pollens, dust mite, feathers cat and dog. She had a vasovagal faint-not an anaphylactic reaction-during intradermal testing. She was gently lowered to the floor, given an EpiPen, placed on cardiac monitor, oximeter and supplemental oxygen. She became fully responsive without injury or chest pain. Monitor showed regular sinus rhythm throughout. There was no rash, wheezing, nausea or vomiting.She was kept for prolonged surveillance,  gradually mobilized and allowed to sit with supervision first in the exam room and then in the waiting room. When  satisfied that she was stable and comfortable she was released to return home with no further problems.Sequential vital signs are recorded below. She was given an Allegra antihistamine at departure.   07/06/14- 43 yoF never smoker followed for asthma, episodic dyspnea, restrictive PFT,  hyperventilation, OSA, anxiety  Hx abnormal CT FOLLOWS FOR: states she is doing well, has only been at work for 3 weeks so far this school year d/t another procedure.   She had hysterectomy, then fell at home and has been put out of work until November 12. Breathing okay. Sneezes when she uses Spiriva. 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 We reviewed results of her allergy testing from July and agreed to try to make conservative measures work.  ROS-see HPI Constitutional:   No-   weight loss, night sweats, fevers, chills, +fatigue, lassitude. HEENT:   No-  headaches, difficulty swallowing, tooth/dental problems, sore throat,       +, itching, no-ear ache, +nasal congestion, +post nasal drip,  CV:  No-chest pain/ pressure, no-orthopnea, PND, swelling in lower extremities, anasarca,                                               dizziness, palpitations Resp: +  shortness of breath with exertion or at rest.  No-   productive cough,  No non-productive cough,  No- coughing up of blood.              No-   change in color of mucus.  No- wheezing.   Skin: No-   rash or lesions. GI:  No-   heartburn, indigestion, abdominal pain, nausea, vomiting, GU:  MS:  No-   joint pain or swelling. + back pain Neuro-     nothing unusual Psych:  No- change in mood or affect. No depression + anxiety.  No memory loss.  OBJ- Physical Exam   General- Alert, Oriented, Affect- calm, Skin- clear Lymphadenopathy- none Head- atraumatic            Eyes- Gross vision intact, PERRLA, conjunctivae and secretions  clear            Ears- Hearing, canals-normal            Nose- Clear, no-Septal dev, mucus, polyps, erosion, perforation             Throat- Mallampati III , mucosa clear , drainage- none, tonsils- atrophic Neck- flexible , trachea midline, no stridor , thyroid nl, carotid no bruit Chest - symmetrical excursion , unlabored           Heart/CV- RRR , no murmur , no gallop  , no rub, nl s1 s2                           - JVD- none , edema- none, stasis changes- none, varices- none           Lung- clear to P&A, wheeze- none, cough- none , dullness-none, rub- none.           Chest wall-  Abd-  Br/ Gen/ Rectal- Not done, not indicated Extrem- cyanosis- none, clubbing, none, atrophy- none, strength- nl Neuro- grossly intact to observation

## 2014-07-07 DIAGNOSIS — J302 Other seasonal allergic rhinitis: Secondary | ICD-10-CM | POA: Insufficient documentation

## 2014-07-07 DIAGNOSIS — J3089 Other allergic rhinitis: Secondary | ICD-10-CM

## 2014-07-07 NOTE — Assessment & Plan Note (Signed)
Discussed environmental precautions Plan-continue antihistamines adding nasal steroid spray as needed. Consider additional tests for mold For now we agreed to be conservative. I will be happy to see her back as needed

## 2014-07-07 NOTE — Assessment & Plan Note (Signed)
Continue see Pap

## 2014-07-18 ENCOUNTER — Encounter: Payer: Self-pay | Admitting: Obstetrics and Gynecology

## 2014-07-18 NOTE — Telephone Encounter (Signed)
Dr.Silva, I have written and printed letter for fax for patient. Letter to your desk. Please review and advise before fax. I have saved the letter in [facility] in case there is anything additional that may be needed.

## 2014-07-18 NOTE — Telephone Encounter (Signed)
Letter sent to [place] office with attention to Destiny Sharp with cover sheet and fax confirmation. Spoke with patient. Advised letter has been sent. Patient is agreeable.  Routing to provider for final review. Patient agreeable to disposition. Will close encounter ;

## 2014-07-30 ENCOUNTER — Telehealth: Payer: Self-pay | Admitting: Obstetrics and Gynecology

## 2014-07-30 NOTE — Telephone Encounter (Signed)
Left message regarding cancelled appointment.

## 2014-10-03 ENCOUNTER — Other Ambulatory Visit: Payer: Self-pay | Admitting: Obstetrics and Gynecology

## 2014-10-03 ENCOUNTER — Ambulatory Visit: Payer: BC Managed Care – PPO | Admitting: Obstetrics and Gynecology

## 2014-10-03 NOTE — Telephone Encounter (Signed)
Medication refill request: Vivelle - Dot patch Last AEX:  03/16/14 - Destiny Sharp Next AEX: 03/24/14  Last MMG (if hormonal medication request): 02/05/14 [id number]:neg Refill authorized: 05/30/14 #8patch/2R. Today #8/6R?

## 2014-10-04 NOTE — Telephone Encounter (Signed)
Since her Hysterectomy - looks like Dr. Quincy Simmonds started her on the patch in October.  Note says she needs a 3 month recheck visit.  This needs to be done and then can refill until that visit.

## 2014-10-05 NOTE — Telephone Encounter (Deleted)
Medication refill request: Vivelle-Dot 0.1 mg Last AEX:  03/16/14 - Mrs Patty Next AEX: 03/25/15  Last MMG (if hormonal medication request): 02/05/14 [id number]:Neg Refill authorized: 05/30/14 #8/2R. Today #8/6R?

## 2014-10-05 NOTE — Telephone Encounter (Signed)
She has Follow up appt with Dr. Quincy Simmonds on 10/26/14.  Rx sent for #8/0R.  Send to Mrs Chong Sicilian for final review. Encounter closed.

## 2014-10-12 ENCOUNTER — Ambulatory Visit: Payer: BC Managed Care – PPO | Admitting: Obstetrics and Gynecology

## 2014-10-26 ENCOUNTER — Ambulatory Visit (INDEPENDENT_AMBULATORY_CARE_PROVIDER_SITE_OTHER): Payer: BC Managed Care – PPO | Admitting: Obstetrics and Gynecology

## 2014-10-26 ENCOUNTER — Encounter: Payer: Self-pay | Admitting: Obstetrics and Gynecology

## 2014-10-26 VITALS — BP 120/62 | HR 84 | Ht 62.0 in | Wt 183.2 lb

## 2014-10-26 DIAGNOSIS — N951 Menopausal and female climacteric states: Secondary | ICD-10-CM

## 2014-10-26 DIAGNOSIS — R945 Abnormal results of liver function studies: Secondary | ICD-10-CM

## 2014-10-26 DIAGNOSIS — R7989 Other specified abnormal findings of blood chemistry: Secondary | ICD-10-CM

## 2014-10-26 MED ORDER — ESTRADIOL 0.1 MG/24HR TD PTTW: MEDICATED_PATCH | TRANSDERMAL | Status: DC

## 2014-10-26 NOTE — Patient Instructions (Signed)
Please see your primary care provider about your elevated liver function tests!

## 2014-10-26 NOTE — Progress Notes (Signed)
Patient ID: Destiny Sharp, female   DOB: May 28, 1971, 44 y.o.   MRN: [id number] GYNECOLOGY  VISIT   HPI: 44 y.o.   Single  Caucasian  female   G0P0 with Patient's last menstrual period was 04/08/2014.   here for 3 month follow up on ERT.    Using Vivelle Dot 0.1 mg twice weekly.  Has hot flashes once and a while.  No night sweats.  Mood feels pretty stable.  Patch sticks well.  Happy on her current dosage.  Still wearing Depends due to urinary incontinence.  Rotates the site on her skin.  Is seeing [facility] for bladder physical therapy.  GYNECOLOGIC HISTORY: Patient's last menstrual period was 04/08/2014. Contraception: Hysterectomy   Menopausal hormone therapy: Vivelle Dot 0.1mg         OB History    Gravida Para Term Preterm AB TAB SAB Ectopic Multiple Living   0                  Patient Active Problem List   Diagnosis Date Noted  . Seasonal and perennial allergic rhinitis 07/07/2014  . Status post laparoscopic hysterectomy 05/22/2014  . Vasovagal attack 03/07/2014  . Obstructive sleep apnea 01/17/2013  . Abnormal CT of the chest 10/30/2012  . Chest pain 10/07/2012  . Hypothyroidism   . Hypertension   . SOB (shortness of breath)   . Fatigue   . Edema     Past Medical History  Diagnosis Date  . Hypothyroidism   . Edema     LLE  . Hyperlipidemia     diet controlled - no med  . Vasovagal syncope     one episode only  . Fibromyalgia   . Scoliosis     history  . Hx of cardiovascular stress test 11/22/12    Lex MV 2/14: EF 81%, no ischemia  . Asthma   . Pneumonia     hx  . Bronchitis     one or two times/year  . Sleep apnea     Does not use CPAP every night  . Headache(784.0)     otc med prn  . Anxiety     no meds  . PONV (postoperative nausea and vomiting)   . Elevated liver function tests     Past Surgical History  Procedure Laterality Date  . Appendectomy    . Nasal sinus surgery      x 3  . Mouth surgery      wisdom teeth ext  .  Robotic assisted total hysterectomy with bilateral salpingo oopherectomy N/A 05/22/2014    Procedure: ROBOTIC ASSISTED TOTAL HYSTERECTOMY WITH BILATERAL SALPINGO OOPHORECTOMY and cystoscopy;  Surgeon: Jamey Reas de Berton Lan, MD;  Location: [facility];  Service: Gynecology;  Laterality: N/A;  . Robotic assisted laparoscopic lysis of adhesion Bilateral 05/22/2014    Procedure: XI ROBOTIC ASSISTED LAPAROSCOPIC LYSIS OF extensive ADHESION;  Surgeon: Jamey Reas de Berton Lan, MD;  Location: [facility];  Service: Gynecology;  Laterality: Bilateral;  . Cystoscopy  05/22/2014    Procedure: CYSTOSCOPY;  Surgeon: Jamey Reas de Berton Lan, MD;  Location: [facility];  Service: Gynecology;;    Current Outpatient Prescriptions  Medication Sig Dispense Refill  . albuterol (PROVENTIL HFA;VENTOLIN HFA) 108 (90 BASE) MCG/ACT inhaler Inhale 2 puffs into the lungs every 4 (four) hours as needed for wheezing or shortness of breath. 1 Inhaler prn  . calcium carbonate (OS-CAL - DOSED IN MG OF ELEMENTAL CALCIUM) 1250  MG tablet Take 1 tablet by mouth daily.    . DENTA 5000 PLUS 1.1 % CREA dental cream Place 1 application onto teeth at bedtime.     Marland Kitchen estradiol (VIVELLE-DOT) 0.1 MG/24HR patch PLACE 1 PATCH (0.1 MG TOTAL) ONTO THE SKIN 2 (TWO) TIMES A WEEK. 8 patch 4  . fluticasone (FLONASE) 50 MCG/ACT nasal spray Place 1 spray into both nostrils daily.    Marland Kitchen ipratropium-albuterol (DUONEB) 0.5-2.5 (3) MG/3ML SOLN Take 3 mLs by nebulization every 6 (six) hours as needed (asthma).    Marland Kitchen levothyroxine (SYNTHROID, LEVOTHROID) 50 MCG tablet Take 50 mcg by mouth daily before breakfast.     . mometasone-formoterol (DULERA) 200-5 MCG/ACT AERO Inhale 2 puffs into the lungs as needed for wheezing (asthma).    . Multiple Vitamin (MULTIVITAMIN WITH MINERALS) TABS Take 1 tablet by mouth every morning.     . Nebulizers (COMPRESSOR/NEBULIZER) MISC Use as directed for asthma 1 each prn  . Omega-3 Fatty Acids (FISH OIL  PO) Take 2,400 mg by mouth daily.    Marland Kitchen OVER THE COUNTER MEDICATION Place 1 application onto the skin as needed. Essential oil therapy Rosemary, Eucaliptus, Stress Away    . Spacer/Aero-Holding Chambers ([place]) MISC     . vitamin B-12 (CYANOCOBALAMIN) 1000 MCG tablet Take 1,000 mcg by mouth every morning.     . vitamin C (ASCORBIC ACID) 500 MG tablet Take 1,000 mg by mouth every morning.      No current facility-administered medications for this visit.     ALLERGIES: Codeine; Hepatitis b virus vaccine; Imitrex; and Pneumococcal vaccines  Family History  Problem Relation Age of Onset  . CAD Maternal Grandmother   . Ovarian cancer Mother 37    ovarian  . Emphysema Maternal Uncle   . Heart disease Maternal Grandfather   . Heart disease Paternal Grandfather   . Heart disease Paternal Grandmother   . Heart disease Father     History   Social History  . Marital Status: Single    Spouse Name: N/A  . Number of Children: 0  . Years of Education: N/A   Occupational History  .  [employer]   Social History Main Topics  . Smoking status: Never Smoker   . Smokeless tobacco: Never Used  . Alcohol Use: No  . Drug Use: No  . Sexual Activity: No     Comment: R-TLH/BSO   Other Topics Concern  . Not on file   Social History Narrative    ROS:  Pertinent items are noted in HPI.  PHYSICAL EXAMINATION:    BP 120/62 mmHg  Pulse 84  Ht 5\' 2"  (1.575 m)  Wt 183 lb 3.2 oz (83.099 kg)  BMI 33.50 kg/m2  LMP 04/08/2014     General appearance: alert, cooperative and appears stated age.  Looks happy today.  ASSESSMENT  Menopausal symptoms controlled on Vivelle Dot 0.1 mg twice weekly. History of mildly elevated LFTs.  PLAN  Will continue Vivelle Dot 0.1 mg twice weekly. Refill until annual exam in July with [facility].  Continue yearly mammograms.  Patient will follow up with her PCP regarding her elevated LFTs.  An After Visit  Summary was printed and given to the patient.  10 minutes face to face time of which over 50% was spent in counseling.

## 2014-10-28 ENCOUNTER — Encounter: Payer: Self-pay | Admitting: Obstetrics and Gynecology

## 2015-01-21 ENCOUNTER — Ambulatory Visit (INDEPENDENT_AMBULATORY_CARE_PROVIDER_SITE_OTHER): Payer: BC Managed Care – PPO | Admitting: Family Medicine

## 2015-01-21 VITALS — BP 126/70 | HR 84 | Temp 98.0°F | Resp 15 | Ht 62.0 in | Wt 179.8 lb

## 2015-01-21 DIAGNOSIS — R531 Weakness: Secondary | ICD-10-CM | POA: Diagnosis not present

## 2015-01-21 DIAGNOSIS — R509 Fever, unspecified: Secondary | ICD-10-CM | POA: Diagnosis not present

## 2015-01-21 LAB — COMPLETE METABOLIC PANEL WITH GFR
ALT: 30 U/L (ref 0–35)
AST: 26 U/L (ref 0–37)
Albumin: 4.4 g/dL (ref 3.5–5.2)
Alkaline Phosphatase: 93 U/L (ref 39–117)
BUN: 12 mg/dL (ref 6–23)
CO2: 27 mEq/L (ref 19–32)
Calcium: 9.8 mg/dL (ref 8.4–10.5)
Chloride: 102 mEq/L (ref 96–112)
Creat: 0.68 mg/dL (ref 0.50–1.10)
GFR, Est African American: >89 mL/min
GFR, Est Non African American: >89 mL/min
Glucose, Bld: 84 mg/dL (ref 70–99)
Potassium: 4.4 mEq/L (ref 3.5–5.3)
Sodium: 140 mEq/L (ref 135–145)
Total Bilirubin: 1 mg/dL (ref 0.2–1.2)
Total Protein: 7.4 g/dL (ref 6.0–8.3)

## 2015-01-21 LAB — POCT CBC
Granulocyte percent: 64.8 %G (ref 37–80)
HCT, POC: 40.8 % (ref 37.7–47.9)
Hemoglobin: 13.6 g/dL (ref 12.2–16.2)
Lymph, poc: 1.9 (ref 0.6–3.4)
MCH, POC: 30.8 pg (ref 27–31.2)
MCHC: 33.2 g/dL (ref 31.8–35.4)
MCV: 92.7 fL (ref 80–97)
MID (cbc): 0.3 (ref 0–0.9)
MPV: 7.1 fL (ref 0–99.8)
POC Granulocyte: 4.1 (ref 2–6.9)
POC LYMPH PERCENT: 30 %L (ref 10–50)
POC MID %: 5.2 %M (ref 0–12)
Platelet Count, POC: 275 10*3/uL (ref 142–424)
RBC: 4.41 M/uL (ref 4.04–5.48)
RDW, POC: 13.9 %
WBC: 6.4 10*3/uL (ref 4.6–10.2)

## 2015-01-21 LAB — POCT SEDIMENTATION RATE: POCT SED RATE: 27 mm/hr — AB (ref 0–22)

## 2015-01-21 NOTE — Patient Instructions (Signed)
Dehydration, Adult Dehydration is when you lose more fluids from the body than you take in. Vital organs like the kidneys, brain, and heart cannot function without a proper amount of fluids and salt. Any loss of fluids from the body can cause dehydration.  CAUSES   Vomiting.  Diarrhea.  Excessive sweating.  Excessive urine output.  Fever. SYMPTOMS  Mild dehydration  Thirst.  Dry lips.  Slightly dry mouth. Moderate dehydration  Very dry mouth.  Sunken eyes.  Skin does not bounce back quickly when lightly pinched and released.  Dark urine and decreased urine production.  Decreased tear production.  Headache. Severe dehydration  Very dry mouth.  Extreme thirst.  Rapid, weak pulse (more than 100 beats per minute at rest).  Cold hands and feet.  Not able to sweat in spite of heat and temperature.  Rapid breathing.  Blue lips.  Confusion and lethargy.  Difficulty being awakened.  Minimal urine production.  No tears. DIAGNOSIS  Your caregiver will diagnose dehydration based on your symptoms and your exam. Blood and urine tests will help confirm the diagnosis. The diagnostic evaluation should also identify the cause of dehydration. TREATMENT  Treatment of mild or moderate dehydration can often be done at home by increasing the amount of fluids that you drink. It is best to drink small amounts of fluid more often. Drinking too much at one time can make vomiting worse. Refer to the home care instructions below. Severe dehydration needs to be treated at the hospital where you will probably be given intravenous (IV) fluids that contain water and electrolytes. HOME CARE INSTRUCTIONS   Ask your caregiver about specific rehydration instructions.  Drink enough fluids to keep your urine clear or pale yellow.  Drink small amounts frequently if you have nausea and vomiting.  Eat as you normally do.  Avoid:  Foods or drinks high in sugar.  Carbonated  drinks.  Juice.  Extremely hot or cold fluids.  Drinks with caffeine.  Fatty, greasy foods.  Alcohol.  Tobacco.  Overeating.  Gelatin desserts.  Wash your hands well to avoid spreading bacteria and viruses.  Only take over-the-counter or prescription medicines for pain, discomfort, or fever as directed by your caregiver.  Ask your caregiver if you should continue all prescribed and over-the-counter medicines.  Keep all follow-up appointments with your caregiver. SEEK MEDICAL CARE IF:  You have abdominal pain and it increases or stays in one area (localizes).  You have a rash, stiff neck, or severe headache.  You are irritable, sleepy, or difficult to awaken.  You are weak, dizzy, or extremely thirsty. SEEK IMMEDIATE MEDICAL CARE IF:   You are unable to keep fluids down or you get worse despite treatment.  You have frequent episodes of vomiting or diarrhea.  You have blood or green matter (bile) in your vomit.  You have blood in your stool or your stool looks black and tarry.  You have not urinated in 6 to 8 hours, or you have only urinated a small amount of very dark urine.  You have a fever.  You faint. MAKE SURE YOU:   Understand these instructions.  Will watch your condition.  Will get help right away if you are not doing well or get worse. Document Released: 08/24/2005 Document Revised: 11/16/2011 Document Reviewed: 04/13/2011 ExitCare Patient Information 2015 [employer]. This information is not intended to replace advice given to you by your health care provider. Make sure you discuss any questions you have with your health care   provider.  

## 2015-01-21 NOTE — Progress Notes (Signed)
Subjective:  This chart was scribed for Robyn Haber MD, by Tamsen Roers, at [facility].  This patient was seen in room 2 and the patient's care was started at 11:23 AM.    Patient ID: Destiny Sharp, female    DOB: September 16, 1970, 44 y.o.   MRN: [id number]  HPI  HPI Comments: Destiny Sharp is a 44 y.o. female who presents to the [facility] complaining of being dehydrated for the last two days. She has associated symptoms of abdominal cramps/ fatigue, dizziness in an upright position, decreased appetite and states that she has not been able to do anything besides lay down.  She also thinks that she is urinating more than the fluid that she is drinking.  Patient has tried drinking gatorade and water over the weekend but states it effected her blood pressure and gave her cramps. She was outside doing yard work all day two days ago and was very hot but states that she did not sweat at all.  Three to four days ago, she was feeling sluggish so she started drinking more water but states that her symptoms only worsened.  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  Patient is a bus driver.  She is single and lives alone. She denies any diarrhea or vomiting, dysuria, vaginal bleeding, hematochezia, insect bites.    Past Medical History  Diagnosis Date   Hypothyroidism    Edema     LLE   Hyperlipidemia     diet controlled - no med   Vasovagal syncope     one episode only   Fibromyalgia    Scoliosis     history   Hx of cardiovascular stress test 11/22/12    Lex MV 2/14: EF 81%, no ischemia   Asthma    Pneumonia     hx   Bronchitis     one or two times/year   Sleep apnea     Does not use CPAP every night   Headache(784.0)     otc med prn   Anxiety     no meds   PONV (postoperative nausea and vomiting)    Elevated liver function tests     Allergy     Current Outpatient Prescriptions on File Prior to Visit  Medication Sig Dispense Refill   albuterol (PROVENTIL HFA;VENTOLIN HFA) 108 (90 BASE) MCG/ACT inhaler Inhale 2 puffs into the lungs every 4 (four) hours as needed for wheezing or shortness of breath. 1 Inhaler prn   calcium carbonate (OS-CAL - DOSED IN MG OF ELEMENTAL CALCIUM) 1250 MG tablet Take 1 tablet by mouth daily.     DENTA 5000 PLUS 1.1 % CREA dental cream Place 1 application onto teeth at bedtime.      estradiol (VIVELLE-DOT) 0.1 MG/24HR patch PLACE 1 PATCH (0.1 MG TOTAL) ONTO THE SKIN 2 (TWO) TIMES A WEEK. 8 patch 4   fluticasone (FLONASE) 50 MCG/ACT nasal spray Place 1 spray into both nostrils daily.     levothyroxine (SYNTHROID, LEVOTHROID) 50 MCG tablet Take 50 mcg by mouth daily before breakfast.      mometasone-formoterol (DULERA) 200-5 MCG/ACT AERO Inhale 2 puffs into the lungs as needed for wheezing (asthma).     Multiple Vitamin (MULTIVITAMIN WITH MINERALS) TABS Take 1 tablet by mouth every morning.      Nebulizers (COMPRESSOR/NEBULIZER) MISC Use as directed  for asthma 1 each prn   Omega-3 Fatty Acids (FISH OIL PO) Take 2,400 mg by mouth daily.     OVER THE COUNTER MEDICATION Place 1 application onto the skin as needed. Essential oil therapy Rosemary, Eucaliptus, Stress Away     Spacer/Aero-Holding Chambers (OPTICHAMBER DIAMOND) MISC      vitamin B-12 (CYANOCOBALAMIN) 1000 MCG tablet Take 1,000 mcg by mouth every morning.      vitamin C (ASCORBIC ACID) 500 MG tablet Take 1,000 mg by mouth every morning.      ipratropium-albuterol (DUONEB) 0.5-2.5 (3) MG/3ML SOLN Take 3 mLs by nebulization every 6 (six) hours as needed (asthma).     No current facility-administered medications on file prior to visit.    Allergies  Allergen Reactions   Codeine Nausea And Vomiting   Hepatitis B Virus Vaccine Itching   Imitrex [Sumatriptan] Other (See Comments)    vomitting   Pneumococcal Vaccines  Itching and Swelling        Review of Systems  Constitutional: Positive for appetite change and fatigue.  Gastrointestinal: Positive for abdominal pain. Negative for nausea, vomiting, diarrhea and blood in stool.  Genitourinary: Negative for vaginal bleeding.  Neurological: Positive for dizziness.       Objective:   Physical Exam  HENT:  Head: Normocephalic and atraumatic.  Eyes: Pupils are equal, round, and reactive to light. Right eye exhibits no discharge. Left eye exhibits no discharge.  Cardiovascular: Normal rate, regular rhythm and normal heart sounds.   No murmur heard. Pulmonary/Chest: Effort normal. No respiratory distress.  Abdominal: Bowel sounds are normal.  Skin: No rash noted.  Nursing note and vitals reviewed. blood pressure right arm laying down: 90/66     Filed Vitals:   01/21/15 1055  BP: 126/70  Pulse: 84  Temp: 98 F (36.7 C)  TempSrc: Oral  Resp: 15  Height: 5\' 2"  (1.575 m)  Weight: 179 lb 12.8 oz (81.557 kg)  SpO2: 99%    Results for orders placed or performed during the hospital encounter of 06/11/14  Urine culture  Result Value Ref Range   Specimen Description URINE, CLEAN CATCH    Special Requests Normal    Culture  Setup Time      06/12/2014 00:26 Performed at [place] Performed at [facility]    Culture NO GROWTH Performed at [facility]    Report Status 06/12/2014 FINAL   CBC with Differential  Result Value Ref Range   WBC 5.4 4.0 - 10.5 K/uL   RBC 3.81 (L) 3.87 - 5.11 MIL/uL   Hemoglobin 12.2 12.0 - 15.0 g/dL   HCT 33.9 (L) 36.0 - 46.0 %   MCV 89.0 78.0 - 100.0 fL   MCH 32.0 26.0 - 34.0 pg   MCHC 36.0 30.0 - 36.0 g/dL   RDW 13.2 11.5 - 15.5 %   Platelets 252 150 - 400 K/uL   Neutrophils Relative % 55 43 - 77 %   Neutro Abs 3.0 1.7 - 7.7 K/uL   Lymphocytes Relative 33 12 - 46 %   Lymphs Abs 1.8 0.7 - 4.0 K/uL   Monocytes Relative 7 3 - 12 %   Monocytes Absolute  0.4 0.1 - 1.0 K/uL   Eosinophils Relative 5 0 - 5 %   Eosinophils Absolute 0.2 0.0 - 0.7 K/uL   Basophils Relative 0 0 - 1 %   Basophils Absolute 0.0 0.0 - 0.1 K/uL  Comprehensive metabolic panel  Result Value Ref Range   Sodium 140 137 - 147 mEq/L   Potassium 4.0 3.7 - 5.3 mEq/L   Chloride 103 96 - 112 mEq/L   CO2 22 19 - 32 mEq/L   Glucose, Bld 93 70 - 99 mg/dL   BUN 15 6 - 23 mg/dL   Creatinine, Ser 0.72 0.50 - 1.10 mg/dL   Calcium 9.6 8.4 - 10.5 mg/dL   Total Protein 7.2 6.0 - 8.3 g/dL   Albumin 4.2 3.5 - 5.2 g/dL   AST 41 (H) 0 - 37 U/L   ALT 46 (H) 0 - 35 U/L   Alkaline Phosphatase 82 39 - 117 U/L   Total Bilirubin 0.9 0.3 - 1.2 mg/dL   GFR calc non Af Amer >90 >90 mL/min   GFR calc Af Amer >90 >90 mL/min   Anion gap 15 5 - 15  Urinalysis, Routine w reflex microscopic  Result Value Ref Range   Color, Urine YELLOW YELLOW   APPearance HAZY (A) CLEAR   Specific Gravity, Urine 1.015 1.005 - 1.030   pH 7.0 5.0 - 8.0   Glucose, UA NEGATIVE NEGATIVE mg/dL   Hgb urine dipstick NEGATIVE NEGATIVE   Bilirubin Urine NEGATIVE NEGATIVE   Ketones, ur NEGATIVE NEGATIVE mg/dL   Protein, ur NEGATIVE NEGATIVE mg/dL   Urobilinogen, UA 0.2 0.0 - 1.0 mg/dL   Nitrite NEGATIVE NEGATIVE   Leukocytes, UA SMALL (A) NEGATIVE  Urine microscopic-add on  Result Value Ref Range   Squamous Epithelial / LPF FEW (A) RARE   WBC, UA 3-6 <3 WBC/hpf   RBC / HPF 0-2 <3 RBC/hpf   Bacteria, UA MANY (A) RARE   Urine-Other AMORPHOUS URATES/PHOSPHATES    Results for orders placed or performed in visit on 01/21/15  POCT CBC  Result Value Ref Range   WBC 6.4 4.6 - 10.2 K/uL   Lymph, poc 1.9 0.6 - 3.4   POC LYMPH PERCENT 30.0 10 - 50 %L   MID (cbc) 0.3 0 - 0.9   POC MID % 5.2 0 - 12 %M   POC Granulocyte 4.1 2 - 6.9   Granulocyte percent 64.8 37 - 80 %G   RBC 4.41 4.04 - 5.48 M/uL   Hemoglobin 13.6 12.2 - 16.2 g/dL   HCT, POC 40.8 37.7 - 47.9 %   MCV 92.7 80 - 97 fL   MCH, POC 30.8 27 - 31.2 pg    MCHC 33.2 31.8 - 35.4 g/dL   RDW, POC 13.9 %   Platelet Count, POC 275 142 - 424 K/uL   MPV 7.1 0 - 99.8 fL        Assessment & Plan:   This chart was scribed in my presence and reviewed by me personally.    ICD-9-CM ICD-10-CM   1. Fever, unspecified fever cause 780.60 R50.9 POCT CBC     POCT SEDIMENTATION RATE     COMPLETE METABOLIC PANEL WITH GFR     Rocky mtn spotted fvr ab, IgM-blood     B. Burgdorfi Antibodies  2. Weakness 780.79 R53.1 POCT CBC     POCT SEDIMENTATION RATE     COMPLETE METABOLIC PANEL WITH GFR     Rocky mtn spotted fvr ab, IgM-blood     B. Burgdorfi Antibodies     Signed, Robyn Haber, MD

## 2015-01-22 LAB — B. BURGDORFI ANTIBODIES: B burgdorferi Ab IgG+IgM: 0.39 {ISR}

## 2015-01-23 LAB — ROCKY MTN SPOTTED FVR AB, IGM-BLOOD: ROCKY MTN SPOTTED FEVER, IGM: 0.51 IV

## 2015-01-24 ENCOUNTER — Encounter: Payer: Self-pay | Admitting: Family Medicine

## 2015-03-25 ENCOUNTER — Encounter: Payer: Self-pay | Admitting: Nurse Practitioner

## 2015-03-25 ENCOUNTER — Ambulatory Visit (INDEPENDENT_AMBULATORY_CARE_PROVIDER_SITE_OTHER): Payer: BC Managed Care – PPO | Admitting: Nurse Practitioner

## 2015-03-25 VITALS — BP 108/66 | HR 84 | Ht 62.25 in | Wt 177.0 lb

## 2015-03-25 DIAGNOSIS — Z9079 Acquired absence of other genital organ(s): Secondary | ICD-10-CM

## 2015-03-25 DIAGNOSIS — Z9889 Other specified postprocedural states: Secondary | ICD-10-CM

## 2015-03-25 DIAGNOSIS — Z0000 Encounter for general adult medical examination without abnormal findings: Secondary | ICD-10-CM

## 2015-03-25 DIAGNOSIS — Z01419 Encounter for gynecological examination (general) (routine) without abnormal findings: Secondary | ICD-10-CM | POA: Diagnosis not present

## 2015-03-25 DIAGNOSIS — Z90710 Acquired absence of both cervix and uterus: Secondary | ICD-10-CM

## 2015-03-25 DIAGNOSIS — Z90722 Acquired absence of ovaries, bilateral: Secondary | ICD-10-CM

## 2015-03-25 MED ORDER — ESTRADIOL 0.1 MG/24HR TD PTTW: MEDICATED_PATCH | TRANSDERMAL | Status: DC

## 2015-03-25 NOTE — Progress Notes (Signed)
Patient ID: Destiny Sharp, female   DOB: June 12, 1971, 44 y.o.   MRN: [id number] 44 y.o. G0P0 Single  Caucasian Fe here for annual exam.  She has done well since hysterectomy in 05/2014.  She is on ERT with Vivelle patch and pretty good with compliance - if missed patch she does get an increase in vaso symptoms.  She has been quite stressed dealing with fathers health and cellulitis of his leg.  Mammo is not done yet.  Patient's last menstrual period was 04/08/2014 (exact date).          Sexually active: No.  The current method of family planning is abstinence.    Exercising: Yes.    walking and yard work Smoker:  no  Health Maintenance: Pap:  03/16/14, Negative with neg HR HPV MMG:  02/05/14, Bi-Rads 1: negative  TDaP:   UTD with PCP Labs:  HB:  01/2015 in EPIC   reports that she has never smoked. She has never used smokeless tobacco. She reports that she does not drink alcohol or use illicit drugs.  Past Medical History  Diagnosis Date  . Hypothyroidism   . Edema     LLE  . Hyperlipidemia     diet controlled - no med  . Vasovagal syncope     one episode only  . Fibromyalgia   . Scoliosis     history  . Hx of cardiovascular stress test 11/22/12    Lex MV 2/14: EF 81%, no ischemia  . Asthma   . Pneumonia     hx  . Bronchitis     one or two times/year  . Sleep apnea     Does not use CPAP every night  . Headache(784.0)     otc med prn  . Anxiety     no meds  . PONV (postoperative nausea and vomiting)   . Elevated liver function tests   . Allergy     Past Surgical History  Procedure Laterality Date  . Appendectomy    . Nasal sinus surgery      x 3  . Mouth surgery      wisdom teeth ext  . Robotic assisted total hysterectomy with bilateral salpingo oopherectomy N/A 05/22/2014    Procedure: ROBOTIC ASSISTED TOTAL HYSTERECTOMY WITH BILATERAL SALPINGO OOPHORECTOMY and cystoscopy;  Surgeon: Jamey Reas de Berton Lan, MD;  Location: [facility];  Service: Gynecology;   Laterality: N/A;  . Robotic assisted laparoscopic lysis of adhesion Bilateral 05/22/2014    Procedure: XI ROBOTIC ASSISTED LAPAROSCOPIC LYSIS OF extensive ADHESION;  Surgeon: Jamey Reas de Berton Lan, MD;  Location: [facility];  Service: Gynecology;  Laterality: Bilateral;  . Cystoscopy  05/22/2014    Procedure: CYSTOSCOPY;  Surgeon: Jamey Reas de Berton Lan, MD;  Location: [facility];  Service: Gynecology;;    Current Outpatient Prescriptions  Medication Sig Dispense Refill  . albuterol (PROVENTIL HFA;VENTOLIN HFA) 108 (90 BASE) MCG/ACT inhaler Inhale 2 puffs into the lungs every 4 (four) hours as needed for wheezing or shortness of breath. 1 Inhaler prn  . calcium carbonate (OS-CAL - DOSED IN MG OF ELEMENTAL CALCIUM) 1250 MG tablet Take 1 tablet by mouth daily.    . DENTA 5000 PLUS 1.1 % CREA dental cream Place 1 application onto teeth at bedtime.     Marland Kitchen estradiol (VIVELLE-DOT) 0.1 MG/24HR patch PLACE 1 PATCH (0.1 MG TOTAL) ONTO THE SKIN 2 (TWO) TIMES A WEEK. 8 patch 0  . fluticasone (FLONASE)  50 MCG/ACT nasal spray Place 1 spray into both nostrils daily.    Marland Kitchen ipratropium-albuterol (DUONEB) 0.5-2.5 (3) MG/3ML SOLN Take 3 mLs by nebulization every 6 (six) hours as needed (asthma).    Marland Kitchen levothyroxine (SYNTHROID, LEVOTHROID) 50 MCG tablet Take 50 mcg by mouth daily before breakfast.     . mometasone-formoterol (DULERA) 200-5 MCG/ACT AERO Inhale 2 puffs into the lungs as needed for wheezing (asthma).    . Multiple Vitamin (MULTIVITAMIN WITH MINERALS) TABS Take 1 tablet by mouth every morning.     . Nebulizers (COMPRESSOR/NEBULIZER) MISC Use as directed for asthma 1 each prn  . Omega-3 Fatty Acids (FISH OIL PO) Take 2,400 mg by mouth daily.    Marland Kitchen OVER THE COUNTER MEDICATION Place 1 application onto the skin as needed. Essential oil therapy Rosemary, Eucaliptus, Stress Away    . Spacer/Aero-Holding Chambers ([place]) MISC     . vitamin B-12 (CYANOCOBALAMIN) 1000 MCG tablet  Take 1,000 mcg by mouth every morning.     . vitamin C (ASCORBIC ACID) 500 MG tablet Take 1,000 mg by mouth every morning.      No current facility-administered medications for this visit.    Family History  Problem Relation Age of Onset  . CAD Maternal Grandmother   . Ovarian cancer Mother 43    ovarian  . Emphysema Maternal Uncle   . Heart disease Maternal Grandfather   . Heart disease Paternal Grandfather   . Heart disease Paternal Grandmother   . Heart disease Father     ROS:  Pertinent items are noted in HPI.  Otherwise, a comprehensive ROS was negative.  Exam:   BP 108/66 mmHg  Pulse 84  Ht 5' 2.25" (1.581 m)  Wt 177 lb (80.287 kg)  BMI 32.12 kg/m2  LMP 04/08/2014 (Exact Date) Height: 5' 2.25" (158.1 cm) Ht Readings from Last 3 Encounters:  03/25/15 5' 2.25" (1.581 m)  01/21/15 5\' 2"  (1.575 m)  10/26/14 5\' 2"  (1.575 m)    General appearance: alert, cooperative and appears stated age Head: Normocephalic, without obvious abnormality, atraumatic Neck: no adenopathy, supple, symmetrical, trachea midline and thyroid normal to inspection and palpation Lungs: clear to auscultation bilaterally Breasts: normal appearance, no masses or tenderness Heart: regular rate and rhythm Abdomen: soft, non-tender; no masses,  no organomegaly Extremities: extremities normal, atraumatic, no cyanosis or edema Skin: Skin color, texture, turgor normal. No rashes or lesions Lymph nodes: Cervical, supraclavicular, and axillary nodes normal. No abnormal inguinal nodes palpated Neurologic: Grossly normal   Pelvic: External genitalia:  no lesions              Urethra:  normal appearing urethra with no masses, tenderness or lesions              Bartholin's and Skene's: normal                 Vagina: normal appearing vagina with normal color and discharge, no lesions              Cervix: absent              Pap taken: No. Bimanual Exam:  Uterus:  uterus absent              Adnexa: no mass,  fullness, tenderness               Rectovaginal: Confirms               Anus:  normal sphincter tone, no lesions  Chaperone present:  no  A:  Well Woman with normal exam  S/P Robotic assisted TAH/BSO with LOA 05/22/14  ERT started 06/2014  [facility]: mother with OV cancer, MA with breast cancer  Hypothyroid    P:   Reviewed health and wellness pertinent to exam  Pap smear as above  Mammogram is past due and pt will schedule  Only one month of Vivelle dot is given until Mammo is back  Counseled with risk factors of DVT, CVA, cancer  Counseled on breast self exam, mammography screening, use and side effects of HRT, adequate intake of calcium and vitamin D, diet and exercise return annually or prn  An After Visit Summary was printed and given to the patient.

## 2015-03-25 NOTE — Patient Instructions (Signed)

## 2015-03-27 NOTE — Progress Notes (Signed)
Encounter reviewed by Dr. Brook Amundson C. Silva.  

## 2015-04-04 ENCOUNTER — Other Ambulatory Visit: Payer: Self-pay

## 2015-04-04 DIAGNOSIS — Z1231 Encounter for screening mammogram for malignant neoplasm of breast: Secondary | ICD-10-CM

## 2015-04-09 ENCOUNTER — Ambulatory Visit
Admission: RE | Admit: 2015-04-09 | Discharge: 2015-04-09 | Disposition: A | Discharge status: Home / Self Care | Payer: BC Managed Care – PPO | Source: Ambulatory Visit

## 2015-04-09 DIAGNOSIS — Z1231 Encounter for screening mammogram for malignant neoplasm of breast: Secondary | ICD-10-CM

## 2015-05-23 ENCOUNTER — Other Ambulatory Visit: Payer: Self-pay | Admitting: Nurse Practitioner

## 2015-05-24 NOTE — Telephone Encounter (Signed)
Medication refill request: Vivelle- Dot Last AEX:  03/15/15 with PG Next AEX: 04/03/16 with PG Last MMG (if hormonal medication request): 04/10/15 Dense Category c, Bi-rads 1 C neg Refill authorized: please advise

## 2015-12-10 ENCOUNTER — Encounter (HOSPITAL_BASED_OUTPATIENT_CLINIC_OR_DEPARTMENT_OTHER): Payer: Self-pay | Admitting: Emergency Medicine

## 2015-12-10 ENCOUNTER — Emergency Department (HOSPITAL_BASED_OUTPATIENT_CLINIC_OR_DEPARTMENT_OTHER)
Admission: EM | Admit: 2015-12-10 | Discharge: 2015-12-10 | Disposition: A | Discharge status: Home / Self Care | Payer: BC Managed Care – PPO | Attending: Emergency Medicine | Admitting: Emergency Medicine

## 2015-12-10 DIAGNOSIS — Z8659 Personal history of other mental and behavioral disorders: Secondary | ICD-10-CM | POA: Insufficient documentation

## 2015-12-10 DIAGNOSIS — E039 Hypothyroidism, unspecified: Secondary | ICD-10-CM | POA: Diagnosis not present

## 2015-12-10 DIAGNOSIS — Y9289 Other specified places as the place of occurrence of the external cause: Secondary | ICD-10-CM | POA: Insufficient documentation

## 2015-12-10 DIAGNOSIS — H66001 Acute suppurative otitis media without spontaneous rupture of ear drum, right ear: Secondary | ICD-10-CM | POA: Diagnosis not present

## 2015-12-10 DIAGNOSIS — J45909 Unspecified asthma, uncomplicated: Secondary | ICD-10-CM | POA: Diagnosis not present

## 2015-12-10 DIAGNOSIS — Z79899 Other long term (current) drug therapy: Secondary | ICD-10-CM | POA: Diagnosis not present

## 2015-12-10 DIAGNOSIS — Z7951 Long term (current) use of inhaled steroids: Secondary | ICD-10-CM | POA: Diagnosis not present

## 2015-12-10 DIAGNOSIS — Z8701 Personal history of pneumonia (recurrent): Secondary | ICD-10-CM | POA: Diagnosis not present

## 2015-12-10 DIAGNOSIS — T148XXA Other injury of unspecified body region, initial encounter: Secondary | ICD-10-CM

## 2015-12-10 DIAGNOSIS — W57XXXA Bitten or stung by nonvenomous insect and other nonvenomous arthropods, initial encounter: Secondary | ICD-10-CM | POA: Diagnosis not present

## 2015-12-10 DIAGNOSIS — Y998 Other external cause status: Secondary | ICD-10-CM | POA: Insufficient documentation

## 2015-12-10 DIAGNOSIS — Y9389 Activity, other specified: Secondary | ICD-10-CM | POA: Insufficient documentation

## 2015-12-10 DIAGNOSIS — E785 Hyperlipidemia, unspecified: Secondary | ICD-10-CM | POA: Insufficient documentation

## 2015-12-10 DIAGNOSIS — Z9981 Dependence on supplemental oxygen: Secondary | ICD-10-CM | POA: Diagnosis not present

## 2015-12-10 DIAGNOSIS — S30861A Insect bite (nonvenomous) of abdominal wall, initial encounter: Secondary | ICD-10-CM | POA: Diagnosis not present

## 2015-12-10 DIAGNOSIS — H9201 Otalgia, right ear: Secondary | ICD-10-CM | POA: Diagnosis present

## 2015-12-10 DIAGNOSIS — G4730 Sleep apnea, unspecified: Secondary | ICD-10-CM | POA: Insufficient documentation

## 2015-12-10 DIAGNOSIS — S30851A Superficial foreign body of abdominal wall, initial encounter: Secondary | ICD-10-CM | POA: Diagnosis not present

## 2015-12-10 DIAGNOSIS — M419 Scoliosis, unspecified: Secondary | ICD-10-CM | POA: Diagnosis not present

## 2015-12-10 MED ORDER — LIDOCAINE-EPINEPHRINE-TETRACAINE (LET) SOLUTION
3.0000 mL | Freq: Once | NASAL | Status: AC
  Administered 2015-12-10: 3 mL via TOPICAL
  Filled 2015-12-10: qty 3

## 2015-12-10 MED ORDER — AMOXICILLIN 500 MG PO CAPS: 500.0000 mg | ORAL_CAPSULE | Freq: Three times a day (TID) | ORAL | Status: DC

## 2015-12-10 MED ORDER — AMOXICILLIN 500 MG PO CAPS
500.0000 mg | ORAL_CAPSULE | Freq: Once | ORAL | Status: AC
  Administered 2015-12-10: 500 mg via ORAL
  Filled 2015-12-10: qty 1

## 2015-12-10 NOTE — ED Provider Notes (Signed)
CSN: [id number]     Arrival date & time 12/10/15  2038 History   First MD Initiated Contact with Patient 12/10/15 2137     Chief Complaint  Patient presents with  . Insect Bite     (Consider location/radiation/quality/duration/timing/severity/associated sxs/prior Treatment) HPI   Blood pressure 134/79, pulse 92, temperature 98.1 F (36.7 C), temperature source Oral, resp. rate 20, last menstrual period 04/08/2014, SpO2 99 %.  Destiny Sharp is a 45 y.o. female complaining of pain and swelling to area where part of a tick was left in her abdomen. Patient states that she attempted to remove the tick last week that she left some of the mouth parts in. She denies fevers, chills, discharge from the area. States her last tetanus shot was within last year. Patient also reports a right-sided otalgia worsening over the course of 2 days without fever, chills, cough, sore throat.  Past Medical History  Diagnosis Date  . Hypothyroidism   . Edema     LLE  . Hyperlipidemia     diet controlled - no med  . Vasovagal syncope     one episode only  . Fibromyalgia   . Scoliosis     history  . Hx of cardiovascular stress test 11/22/12    Lex MV 2/14: EF 81%, no ischemia  . Asthma   . Pneumonia     hx  . Bronchitis     one or two times/year  . Sleep apnea     Does not use CPAP every night  . Headache(784.0)     otc med prn  . Anxiety     no meds  . PONV (postoperative nausea and vomiting)   . Elevated liver function tests   . Allergy    Past Surgical History  Procedure Laterality Date  . Appendectomy    . Nasal sinus surgery      x 3  . Mouth surgery      wisdom teeth ext  . Robotic assisted total hysterectomy with bilateral salpingo oopherectomy N/A 05/22/2014    Procedure: ROBOTIC ASSISTED TOTAL HYSTERECTOMY WITH BILATERAL SALPINGO OOPHORECTOMY and cystoscopy;  Surgeon: Jamey Reas de Berton Lan, MD;  Location: [facility];  Service: Gynecology;  Laterality: N/A;  . Robotic  assisted laparoscopic lysis of adhesion Bilateral 05/22/2014    Procedure: XI ROBOTIC ASSISTED LAPAROSCOPIC LYSIS OF extensive ADHESION;  Surgeon: Jamey Reas de Berton Lan, MD;  Location: [facility];  Service: Gynecology;  Laterality: Bilateral;  . Cystoscopy  05/22/2014    Procedure: CYSTOSCOPY;  Surgeon: Jamey Reas de Berton Lan, MD;  Location: [facility];  Service: Gynecology;;   Family History  Problem Relation Age of Onset  . CAD Maternal Grandmother   . Ovarian cancer Mother 56    ovarian  . Emphysema Maternal Uncle   . Heart disease Maternal Grandfather   . Heart disease Paternal Grandfather   . Heart disease Paternal Grandmother   . Heart disease Father   . Breast cancer Maternal Aunt    Social History  Substance Use Topics  . Smoking status: Never Smoker   . Smokeless tobacco: Never Used  . Alcohol Use: No   OB History    Gravida Para Term Preterm AB TAB SAB Ectopic Multiple Living   0              Review of Systems  10 systems reviewed and found to be negative, except as noted in the HPI.  Allergies  Codeine; Hepatitis b virus vaccine; Imitrex; and Pneumococcal vaccines  Home Medications   Prior to Admission medications   Medication Sig Start Date End Date Taking? Authorizing Provider  albuterol (PROVENTIL HFA;VENTOLIN HFA) 108 (90 BASE) MCG/ACT inhaler Inhale 2 puffs into the lungs every 4 (four) hours as needed for wheezing or shortness of breath. 02/03/13   Deneise Lever, MD  calcium carbonate (OS-CAL - DOSED IN MG OF ELEMENTAL CALCIUM) 1250 MG tablet Take 1 tablet by mouth daily.    Historical Provider, MD  DENTA 5000 PLUS 1.1 % CREA dental cream Place 1 application onto teeth at bedtime.  05/03/14   Historical Provider, MD  estradiol (VIVELLE-DOT) 0.1 MG/24HR patch PLACE 1 PATCH (0.1 MG TOTAL) ONTO THE SKIN 2 (TWO) TIMES A WEEK. 05/24/15   Regina Eck, CNM  fluticasone (FLONASE) 50 MCG/ACT nasal spray Place 1 spray into both nostrils  daily. 01/11/14   Historical Provider, MD  ipratropium-albuterol (DUONEB) 0.5-2.5 (3) MG/3ML SOLN Take 3 mLs by nebulization every 6 (six) hours as needed (asthma).    Historical Provider, MD  levothyroxine (SYNTHROID, LEVOTHROID) 50 MCG tablet Take 50 mcg by mouth daily before breakfast.     Historical Provider, MD  mometasone-formoterol (DULERA) 200-5 MCG/ACT AERO Inhale 2 puffs into the lungs as needed for wheezing (asthma).    Historical Provider, MD  Multiple Vitamin (MULTIVITAMIN WITH MINERALS) TABS Take 1 tablet by mouth every morning.     Historical Provider, MD  Nebulizers (COMPRESSOR/NEBULIZER) MISC Use as directed for asthma 02/03/13   Deneise Lever, MD  Omega-3 Fatty Acids (FISH OIL PO) Take 2,400 mg by mouth daily.    Historical Provider, MD  OVER THE COUNTER MEDICATION Place 1 application onto the skin as needed. Essential oil therapy Anola Gurney, Stress Away    Historical Provider, MD  Spacer/Aero-Holding Chambers ([place]) [place]  01/27/13   Historical Provider, MD  vitamin B-12 (CYANOCOBALAMIN) 1000 MCG tablet Take 1,000 mcg by mouth every morning.     Historical Provider, MD  vitamin C (ASCORBIC ACID) 500 MG tablet Take 1,000 mg by mouth every morning.     Historical Provider, MD   BP 134/79 mmHg  Pulse 92  Temp(Src) 98.1 F (36.7 C) (Oral)  Resp 20  SpO2 99%  LMP 04/08/2014 (Exact Date) Physical Exam  Constitutional: She is oriented to person, place, and time. She appears well-developed and well-nourished. No distress.  HENT:  Head: Normocephalic and atraumatic.  Mouth/Throat: Oropharynx is clear and moist.  Right TM bulging with dulled light reflex.  Eyes: Conjunctivae and EOM are normal. Pupils are equal, round, and reactive to light.  Neck: Normal range of motion.  Cardiovascular: Normal rate, regular rhythm and intact distal pulses.   Pulmonary/Chest: Effort normal and breath sounds normal. No stridor.  Abdominal: Soft. There is no tenderness.    Musculoskeletal: Normal range of motion.  Neurological: She is alert and oriented to person, place, and time.  Skin: She is not diaphoretic.     Psychiatric: She has a normal mood and affect.  Nursing note and vitals reviewed.   ED Course  .Foreign Body Removal Date/Time: 12/10/2015 11:28 PM Performed by: Monico Blitz Authorized by: Monico Blitz Consent: Verbal consent obtained. Consent given by: patient Body area: skin General location: trunk Local anesthetic: LET (lido,epi,tetracaine) Anesthetic total: 3 ml Patient sedated: no Patient restrained: no Localization method: visualized Removal mechanism: forceps and scalpel Dressing: antibiotic ointment Tendon involvement: none Depth: subcutaneous Complexity: simple 1 objects recovered.  Objects recovered: DARK ORGANIC MATERIAL Post-procedure assessment: foreign body removed Patient tolerance: Patient tolerated the procedure well with no immediate complications   (including critical care time) Labs Review Labs Reviewed - No data to display  Imaging Review No results found. I have personally reviewed and evaluated these images and lab results as part of my medical decision-making.   EKG Interpretation None      MDM   Final diagnoses:  Acute suppurative otitis media of right ear without spontaneous rupture of tympanic membrane, recurrence not specified  Foreign body in skin   Filed Vitals:   12/10/15 2045  BP: 134/79  Pulse: 92  Temp: 98.1 F (36.7 C)  TempSrc: Oral  Resp: 20  SpO2: 99%    Medications  lidocaine-EPINEPHrine-tetracaine (LET) solution (3 mLs Topical Given 12/10/15 2217)  amoxicillin (AMOXIL) capsule 500 mg (500 mg Oral Given 12/10/15 2217)    Destiny Sharp is 45 y.o. female presenting with Retained bits of tic mouth pieces in abdomen. Tetanus shot is up-to-date, is not a significant infection surrounding. Removed foreign body, patient will be started on amoxicillin for otitis  media, will also cover any slight MSSA.  Evaluation does not show pathology that would require ongoing emergent intervention or inpatient treatment. Pt is hemodynamically stable and mentating appropriately. Discussed findings and plan with patient/guardian, who agrees with care plan. All questions answered. Return precautions discussed and outpatient follow up given.   New Prescriptions   AMOXICILLIN (AMOXIL) 500 MG CAPSULE    Take 1 capsule (500 mg total) by mouth 3 (three) times daily.       Monico Blitz, PA-C 12/10/15 2330  Forde Dandy, MD 12/11/15 [phone]

## 2015-12-10 NOTE — ED Notes (Signed)
Patient states that she pulled a tick off her stomach last week. Reports that she still has part of it in her stomach and now has a red area where it is

## 2015-12-10 NOTE — Discharge Instructions (Signed)
° °  Wash the affected area with soap and water and apply a thin layer of topical antibiotic ointment. Do this every 12 hours.   Do not use rubbing alcohol or hydrogen peroxide.                        Look for signs of infection: if you see redness, if the area becomes warm, if pain increases sharply, there is discharge (pus), if red streaks appear or you develop fever or vomiting, RETURN immediately to the Emergency Department  for a recheck.   Take your antibiotics as directed and to completion. You should never have any leftover antibiotics! Push fluids and stay well hydrated.   Any antibiotic use can reduce the efficacy of hormonal birth control. Please use back up method of contraception.    Otitis Media, Adult Otitis media is redness, soreness, and inflammation of the middle ear. Otitis media may be caused by allergies or, most commonly, by infection. Often it occurs as a complication of the common cold. SIGNS AND SYMPTOMS Symptoms of otitis media may include:  Earache.  Fever.  Ringing in your ear.  Headache.  Leakage of fluid from the ear. DIAGNOSIS To diagnose otitis media, your health care provider will examine your ear with an otoscope. This is an instrument that allows your health care provider to see into your ear in order to examine your eardrum. Your health care provider also will ask you questions about your symptoms. TREATMENT  Typically, otitis media resolves on its own within 3-5 days. Your health care provider may prescribe medicine to ease your symptoms of pain. If otitis media does not resolve within 5 days or is recurrent, your health care provider may prescribe antibiotic medicines if he or she suspects that a bacterial infection is the cause. HOME CARE INSTRUCTIONS   If you were prescribed an antibiotic medicine, finish it all even if you start to feel better.  Take medicines only as directed by your health care provider.  Keep all follow-up visits as  directed by your health care provider. SEEK MEDICAL CARE IF:  You have otitis media only in one ear, or bleeding from your nose, or both.  You notice a lump on your neck.  You are not getting better in 3-5 days.  You feel worse instead of better. SEEK IMMEDIATE MEDICAL CARE IF:   You have pain that is not controlled with medicine.  You have swelling, redness, or pain around your ear or stiffness in your neck.  You notice that part of your face is paralyzed.  You notice that the bone behind your ear (mastoid) is tender when you touch it. MAKE SURE YOU:   Understand these instructions.  Will watch your condition.  Will get help right away if you are not doing well or get worse.   This information is not intended to replace advice given to you by your health care provider. Make sure you discuss any questions you have with your health care provider.   Document Released: 05/29/2004 Document Revised: 09/14/2014 Document Reviewed: 03/21/2013 Elsevier Interactive Patient Education [employer].

## 2016-01-18 ENCOUNTER — Emergency Department (HOSPITAL_COMMUNITY)
Admission: EM | Admit: 2016-01-18 | Discharge: 2016-01-19 | Disposition: A | Discharge status: Home / Self Care | Payer: BC Managed Care – PPO | Attending: Emergency Medicine | Admitting: Emergency Medicine

## 2016-01-18 ENCOUNTER — Encounter (HOSPITAL_COMMUNITY): Payer: Self-pay

## 2016-01-18 DIAGNOSIS — G4730 Sleep apnea, unspecified: Secondary | ICD-10-CM | POA: Insufficient documentation

## 2016-01-18 DIAGNOSIS — E785 Hyperlipidemia, unspecified: Secondary | ICD-10-CM | POA: Insufficient documentation

## 2016-01-18 DIAGNOSIS — Z7951 Long term (current) use of inhaled steroids: Secondary | ICD-10-CM | POA: Insufficient documentation

## 2016-01-18 DIAGNOSIS — M419 Scoliosis, unspecified: Secondary | ICD-10-CM | POA: Insufficient documentation

## 2016-01-18 DIAGNOSIS — Z792 Long term (current) use of antibiotics: Secondary | ICD-10-CM | POA: Diagnosis not present

## 2016-01-18 DIAGNOSIS — F438 Other reactions to severe stress: Secondary | ICD-10-CM | POA: Insufficient documentation

## 2016-01-18 DIAGNOSIS — R457 State of emotional shock and stress, unspecified: Secondary | ICD-10-CM

## 2016-01-18 DIAGNOSIS — Z79899 Other long term (current) drug therapy: Secondary | ICD-10-CM | POA: Insufficient documentation

## 2016-01-18 DIAGNOSIS — Z9981 Dependence on supplemental oxygen: Secondary | ICD-10-CM | POA: Diagnosis not present

## 2016-01-18 DIAGNOSIS — Z8659 Personal history of other mental and behavioral disorders: Secondary | ICD-10-CM | POA: Diagnosis not present

## 2016-01-18 DIAGNOSIS — J45909 Unspecified asthma, uncomplicated: Secondary | ICD-10-CM | POA: Insufficient documentation

## 2016-01-18 DIAGNOSIS — R079 Chest pain, unspecified: Secondary | ICD-10-CM | POA: Diagnosis present

## 2016-01-18 DIAGNOSIS — R0789 Other chest pain: Secondary | ICD-10-CM | POA: Diagnosis not present

## 2016-01-18 DIAGNOSIS — E039 Hypothyroidism, unspecified: Secondary | ICD-10-CM | POA: Insufficient documentation

## 2016-01-18 DIAGNOSIS — Z8701 Personal history of pneumonia (recurrent): Secondary | ICD-10-CM | POA: Diagnosis not present

## 2016-01-18 LAB — BASIC METABOLIC PANEL
Anion gap: 9 (ref 5–15)
BUN: 14 mg/dL (ref 6–20)
CALCIUM: 9.4 mg/dL (ref 8.9–10.3)
CHLORIDE: 106 mmol/L (ref 101–111)
CO2: 24 mmol/L (ref 22–32)
Creatinine, Ser: 0.74 mg/dL (ref 0.44–1.00)
GFR calc non Af Amer: >60 mL/min (ref >60)
Glucose, Bld: 134 mg/dL — ABNORMAL HIGH (ref 65–99)
Potassium: 3.8 mmol/L (ref 3.5–5.1)
SODIUM: 139 mmol/L (ref 135–145)

## 2016-01-18 LAB — CBC
HEMATOCRIT: 27.1 % — AB (ref 36.0–46.0)
HEMOGLOBIN: 8.1 g/dL — AB (ref 12.0–15.0)
MCH: 22.9 pg — AB (ref 26.0–34.0)
MCHC: 29.9 g/dL — AB (ref 30.0–36.0)
MCV: 76.8 fL — AB (ref 78.0–100.0)
Platelets: 284 10*3/uL (ref 150–400)
RBC: 3.53 MIL/uL — ABNORMAL LOW (ref 3.87–5.11)
RDW: 16.8 % — ABNORMAL HIGH (ref 11.5–15.5)
WBC: 6.5 10*3/uL (ref 4.0–10.5)

## 2016-01-18 LAB — I-STAT TROPONIN, ED: Troponin i, poc: 0 ng/mL (ref 0.00–0.08)

## 2016-01-18 NOTE — ED Notes (Signed)
Pt reports has not had good night sleep since 01-14-16 d/t rat problem at her house.  Since then has been weak.  Onset today at 6:15p after eating pt started having left chest pain, nausea. RUQ pain x several weeks. Pt thinks maybe gallbladder.

## 2016-01-19 ENCOUNTER — Emergency Department (HOSPITAL_COMMUNITY): Payer: BC Managed Care – PPO

## 2016-01-19 LAB — HEPATIC FUNCTION PANEL
ALBUMIN: 3.5 g/dL (ref 3.5–5.0)
ALK PHOS: 87 U/L (ref 38–126)
ALT: 25 U/L (ref 14–54)
AST: 24 U/L (ref 15–41)
BILIRUBIN INDIRECT: 0.8 mg/dL (ref 0.3–0.9)
BILIRUBIN TOTAL: 1 mg/dL (ref 0.3–1.2)
Bilirubin, Direct: 0.2 mg/dL (ref 0.1–0.5)
Total Protein: 6.4 g/dL — ABNORMAL LOW (ref 6.5–8.1)

## 2016-01-19 LAB — CBC WITH DIFFERENTIAL/PLATELET
Basophils Absolute: 0 10*3/uL (ref 0.0–0.1)
Basophils Relative: 1 %
Eosinophils Absolute: 0.3 10*3/uL (ref 0.0–0.7)
Eosinophils Relative: 5 %
HEMATOCRIT: 33.8 % — AB (ref 36.0–46.0)
HEMOGLOBIN: 11.8 g/dL — AB (ref 12.0–15.0)
LYMPHS ABS: 2.1 10*3/uL (ref 0.7–4.0)
LYMPHS PCT: 37 %
MCH: 31.7 pg (ref 26.0–34.0)
MCHC: 34.9 g/dL (ref 30.0–36.0)
MCV: 90.9 fL (ref 78.0–100.0)
MONO ABS: 0.3 10*3/uL (ref 0.1–1.0)
MONOS PCT: 5 %
NEUTROS ABS: 3 10*3/uL (ref 1.7–7.7)
NEUTROS PCT: 53 %
Platelets: 194 10*3/uL (ref 150–400)
RBC: 3.72 MIL/uL — ABNORMAL LOW (ref 3.87–5.11)
RDW: 13.9 % (ref 11.5–15.5)
WBC: 5.7 10*3/uL (ref 4.0–10.5)

## 2016-01-19 LAB — LIPASE, BLOOD: LIPASE: 25 U/L (ref 11–51)

## 2016-01-19 LAB — POC OCCULT BLOOD, ED: Fecal Occult Bld: NEGATIVE

## 2016-01-19 LAB — TYPE AND SCREEN
ABO/RH(D): O POS
Antibody Screen: NEGATIVE

## 2016-01-19 LAB — ABO/RH: ABO/RH(D): O POS

## 2016-01-19 MED ORDER — HYDROXYZINE HCL 25 MG PO TABS: 25.0000 mg | ORAL_TABLET | Freq: Four times a day (QID) | ORAL | Status: DC | PRN

## 2016-01-19 NOTE — Discharge Instructions (Signed)
°Nonspecific Chest Pain  °Chest pain can be caused by many different conditions. There is always a chance that your pain could be related to something serious, such as a heart attack or a blood clot in your lungs. Chest pain can also be caused by conditions that are not life-threatening. If you have chest pain, it is very important to follow up with your health care provider. °CAUSES  °Chest pain can be caused by: °· Heartburn. °· Pneumonia or bronchitis. °· Anxiety or stress. °· Inflammation around your heart (pericarditis) or lung (pleuritis or pleurisy). °· A blood clot in your lung. °· A collapsed lung (pneumothorax). It can develop suddenly on its own (spontaneous pneumothorax) or from trauma to the chest. °· Shingles infection (varicella-zoster virus). °· Heart attack. °· Damage to the bones, muscles, and cartilage that make up your chest wall. This can include: °¨ Bruised bones due to injury. °¨ Strained muscles or cartilage due to frequent or repeated coughing or overwork. °¨ Fracture to one or more ribs. °¨ Sore cartilage due to inflammation (costochondritis). °RISK FACTORS  °Risk factors for chest pain may include: °· Activities that increase your risk for trauma or injury to your chest. °· Respiratory infections or conditions that cause frequent coughing. °· Medical conditions or overeating that can cause heartburn. °· Heart disease or family history of heart disease. °· Conditions or health behaviors that increase your risk of developing a blood clot. °· Having had chicken pox (varicella zoster). °SIGNS AND SYMPTOMS °Chest pain can feel like: °· Burning or tingling on the surface of your chest or deep in your chest. °· Crushing, pressure, aching, or squeezing pain. °· Dull or sharp pain that is worse when you move, cough, or take a deep breath. °· Pain that is also felt in your back, neck, shoulder, or arm, or pain that spreads to any of these areas. °Your chest pain may come and go, or it may stay  constant. °DIAGNOSIS °Lab tests or other studies may be needed to find the cause of your pain. Your health care provider may have you take a test called an ambulatory ECG (electrocardiogram). An ECG records your heartbeat patterns at the time the test is performed. You may also have other tests, such as: °· Transthoracic echocardiogram (TTE). During echocardiography, sound waves are used to create a picture of all of the heart structures and to look at how blood flows through your heart. °· Transesophageal echocardiogram (TEE). This is a more advanced imaging test that obtains images from inside your body. It allows your health care provider to see your heart in finer detail. °· Cardiac monitoring. This allows your health care provider to monitor your heart rate and rhythm in real time. °· Holter monitor. This is a portable device that records your heartbeat and can help to diagnose abnormal heartbeats. It allows your health care provider to track your heart activity for several days, if needed. °· Stress tests. These can be done through exercise or by taking medicine that makes your heart beat more quickly. °· Blood tests. °· Imaging tests. °TREATMENT  °Your treatment depends on what is causing your chest pain. Treatment may include: °· Medicines. These may include: °¨ Acid blockers for heartburn. °¨ Anti-inflammatory medicine. °¨ Pain medicine for inflammatory conditions. °¨ Antibiotic medicine, if an infection is present. °¨ Medicines to dissolve blood clots. °¨ Medicines to treat coronary artery disease. °· Supportive care for conditions that do not require medicines. This may include: °¨ Resting. °¨ Applying heat   or cold packs to injured areas. °¨ Limiting activities until pain decreases. °HOME CARE INSTRUCTIONS °· If you were prescribed an antibiotic medicine, finish it all even if you start to feel better. °· Avoid any activities that bring on chest pain. °· Do not use any tobacco products, including  cigarettes, chewing tobacco, or electronic cigarettes. If you need help quitting, ask your health care provider. °· Do not drink alcohol. °· Take medicines only as directed by your health care provider. °· Keep all follow-up visits as directed by your health care provider. This is important. This includes any further testing if your chest pain does not go away. °· If heartburn is the cause for your chest pain, you may be told to keep your head raised (elevated) while sleeping. This reduces the chance that acid will go from your stomach into your esophagus. °· Make lifestyle changes as directed by your health care provider. These may include: °¨ Getting regular exercise. Ask your health care provider to suggest some activities that are safe for you. °¨ Eating a heart-healthy diet. A registered dietitian can help you to learn healthy eating options. °¨ Maintaining a healthy weight. °¨ Managing diabetes, if necessary. °¨ Reducing stress. °SEEK MEDICAL CARE IF: °· Your chest pain does not go away after treatment. °· You have a rash with blisters on your chest. °· You have a fever. °SEEK IMMEDIATE MEDICAL CARE IF:  °· Your chest pain is worse. °· You have an increasing cough, or you cough up blood. °· You have severe abdominal pain. °· You have severe weakness. °· You faint. °· You have chills. °· You have sudden, unexplained chest discomfort. °· You have sudden, unexplained discomfort in your arms, back, neck, or jaw. °· You have shortness of breath at any time. °· You suddenly start to sweat, or your skin gets clammy. °· You feel nauseous or you vomit. °· You suddenly feel light-headed or dizzy. °· Your heart begins to beat quickly, or it feels like it is skipping beats. °These symptoms may represent a serious problem that is an emergency. Do not wait to see if the symptoms will go away. Get medical help right away. Call your local emergency services (911 in the [place] Do not drive yourself to the hospital. °  °This  information is not intended to replace advice given to you by your health care provider. Make sure you discuss any questions you have with your health care provider. °  °Document Released: 06/03/2005 Document Revised: 09/14/2014 Document Reviewed: 03/30/2014 °Elsevier Interactive Patient Education ©2016 Elsevier Inc[employer] °Stress and Stress Management °Stress is a normal reaction to life events. It is what you feel when life demands more than you are used to or more than you can handle. Some stress can be useful. For example, the stress reaction can help you catch the last bus of the day, study for a test, or meet a deadline at work. But stress that occurs too often or for too long can cause problems. It can affect your emotional health and interfere with relationships and normal daily activities. Too much stress can weaken your immune system and increase your risk for physical illness. If you already have a medical problem, stress can make it worse. °CAUSES  °All sorts of life events may cause stress. An event that causes stress for one person may not be stressful for another person. Major life events commonly cause stress. These may be positive or negative. Examples include losing your job, moving into   a new home, getting married, having a baby, or losing a loved one. Less obvious life events may also cause stress, especially if they occur day after day or in combination. Examples include working long hours, driving in traffic, caring for children, being in debt, or being in a difficult relationship. °SIGNS AND SYMPTOMS °Stress may cause emotional symptoms including, the following: °· Anxiety. This is feeling worried, afraid, on edge, overwhelmed, or out of control. °· Anger. This is feeling irritated or impatient. °· Depression. This is feeling sad, down, helpless, or guilty. °· Difficulty focusing, remembering, or making decisions. °Stress may cause physical symptoms, including the following:  °· Aches and pains.  These may affect your head, neck, back, stomach, or other areas of your body. °· Tight muscles or clenched jaw. °· Low energy or trouble sleeping.  °Stress may cause unhealthy behaviors, including the following:  °· Eating to feel better (overeating) or skipping meals. °· Sleeping too little, too much, or both. °· Working too much or putting off tasks (procrastination). °· Smoking, drinking alcohol, or using drugs to feel better. °DIAGNOSIS  °Stress is diagnosed through an assessment by your health care provider. Your health care provider will ask questions about your symptoms and any stressful life events. Your health care provider will also ask about your medical history and may order blood tests or other tests. Certain medical conditions and medicine can cause physical symptoms similar to stress.  Mental illness can cause emotional symptoms and unhealthy behaviors similar to stress. Your health care provider may refer you to a mental health professional for further evaluation.  °TREATMENT  °Stress management is the recommended treatment for stress. The goals of stress management are reducing stressful life events and coping with stress in healthy ways.  °Techniques for reducing stressful life events include the following: °· Stress identification. Self-monitor for stress and identify what causes stress for you. These skills may help you to avoid some stressful events. °· Time management. Set your priorities, keep a calendar of events, and learn to say "no." These tools can help you avoid making too many commitments. °Techniques for coping with stress include the following: °· Rethinking the problem. Try to think realistically about stressful events rather than ignoring them or overreacting. Try to find the positives in a stressful situation rather than focusing on the negatives. °· Exercise. Physical exercise can release both physical and emotional tension. The key is to find a form of exercise you enjoy and do  it regularly. °· Relaxation techniques. These relax the body and mind. Examples include yoga, meditation, tai chi, biofeedback, deep breathing, progressive muscle relaxation, listening to music, being out in nature, journaling, and other hobbies. Again, the key is to find one or more that you enjoy and can do regularly. °· Healthy lifestyle. Eat a balanced diet, get plenty of sleep, and do not smoke. Avoid using alcohol or drugs to relax. °· Strong support network. Spend time with family, friends, or other people you enjoy being around. Express your feelings and talk things over with someone you trust. °Counseling or talk therapy with a mental health professional may be helpful if you are having difficulty managing stress on your own. Medicine is typically not recommended for the treatment of stress. Talk to your health care provider if you think you need medicine for symptoms of stress. °HOME CARE INSTRUCTIONS °· Keep all follow-up visits as directed by your health care provider. °· Take all medicines as directed by your health care provider. °SEEK MEDICAL CARE IF: °· Your symptoms get   worse or you start having new symptoms. °· You feel overwhelmed by your problems and can no longer manage them on your own. °SEEK IMMEDIATE MEDICAL CARE IF: °· You feel like hurting yourself or someone else. °  °This information is not intended to replace advice given to you by your health care provider. Make sure you discuss any questions you have with your health care provider. °  °Document Released: 02/17/2001 Document Revised: 09/14/2014 Document Reviewed: 04/18/2013 °Elsevier Interactive Patient Education ©2016 Elsevier Inc. ° °

## 2016-01-19 NOTE — ED Provider Notes (Signed)
CSN: [id number]     Arrival date & time 01/18/16  2300 History   First MD Initiated Contact with Patient 01/19/16 0015     Chief Complaint  Patient presents with  . Chest Pain     (Consider location/radiation/quality/duration/timing/severity/associated sxs/prior Treatment) HPI  This is a 45 year old female who presents emergency Department with chief complaint of chest pain, abdominal pain and shortness of breath. Patient states she has been extremely stressed out over the past few days because there are rats in her home. They have just caught a exterminator. She feels that her house is dirty even if she keeps it clean house. Patient states she has been unable to sleep because of this. She also states that her mother died about 6 years ago on Mother's Day and she is grieving her. The patient states that she has been exhausted, feeling somewhat short of breath and dizzy at times with standing. She does have a history of OSA and is frequently noncompliant with her CPAP machine. The patient also has noticed black tarry stools over the past few days. However, over the past 2 days. She's had very light colored stools. She has intermittent right upper quadrant abdominal pain. States she has had multiple workups with gastroenterology for this with negative results. The patient has chronic left lower extremity edema, is taking estrogen replacement therapy. She denies any risk factors for a acute coronary syndrome. She has had some mild nausea without vomiting.  Past Medical History  Diagnosis Date  . Hypothyroidism   . Edema     LLE  . Hyperlipidemia     diet controlled - no med  . Vasovagal syncope     one episode only  . Fibromyalgia   . Scoliosis     history  . Hx of cardiovascular stress test 11/22/12    Lex MV 2/14: EF 81%, no ischemia  . Asthma   . Pneumonia     hx  . Bronchitis     one or two times/year  . Sleep apnea     Does not use CPAP every night  . Headache(784.0)     otc med  prn  . Anxiety     no meds  . PONV (postoperative nausea and vomiting)   . Elevated liver function tests   . Allergy    Past Surgical History  Procedure Laterality Date  . Appendectomy    . Nasal sinus surgery      x 3  . Mouth surgery      wisdom teeth ext  . Robotic assisted total hysterectomy with bilateral salpingo oopherectomy N/A 05/22/2014    Procedure: ROBOTIC ASSISTED TOTAL HYSTERECTOMY WITH BILATERAL SALPINGO OOPHORECTOMY and cystoscopy;  Surgeon: Jamey Reas de Berton Lan, MD;  Location: [facility];  Service: Gynecology;  Laterality: N/A;  . Robotic assisted laparoscopic lysis of adhesion Bilateral 05/22/2014    Procedure: XI ROBOTIC ASSISTED LAPAROSCOPIC LYSIS OF extensive ADHESION;  Surgeon: Jamey Reas de Berton Lan, MD;  Location: [facility];  Service: Gynecology;  Laterality: Bilateral;  . Cystoscopy  05/22/2014    Procedure: CYSTOSCOPY;  Surgeon: Jamey Reas de Berton Lan, MD;  Location: [facility];  Service: Gynecology;;   Family History  Problem Relation Age of Onset  . CAD Maternal Grandmother   . Ovarian cancer Mother 39    ovarian  . Emphysema Maternal Uncle   . Heart disease Maternal Grandfather   . Heart disease Paternal Grandfather   . Heart  disease Paternal Grandmother   . Heart disease Father   . Breast cancer Maternal Aunt    Social History  Substance Use Topics  . Smoking status: Never Smoker   . Smokeless tobacco: Never Used  . Alcohol Use: No   OB History    Gravida Para Term Preterm AB TAB SAB Ectopic Multiple Living   0              Review of Systems   Ten systems reviewed and are negative for acute change, except as noted in the HPI.   Allergies  Codeine; Hepatitis b virus vaccine; Imitrex; and Pneumococcal vaccines  Home Medications   Prior to Admission medications   Medication Sig Start Date End Date Taking? Authorizing Provider  albuterol (PROVENTIL HFA;VENTOLIN HFA) 108 (90 BASE) MCG/ACT inhaler Inhale 2  puffs into the lungs every 4 (four) hours as needed for wheezing or shortness of breath. 02/03/13   Deneise Lever, MD  amoxicillin (AMOXIL) 500 MG capsule Take 1 capsule (500 mg total) by mouth 3 (three) times daily. 12/10/15   Nicole Pisciotta, PA-C  calcium carbonate (OS-CAL - DOSED IN MG OF ELEMENTAL CALCIUM) 1250 MG tablet Take 1 tablet by mouth daily.    Historical Provider, MD  DENTA 5000 PLUS 1.1 % CREA dental cream Place 1 application onto teeth at bedtime.  05/03/14   Historical Provider, MD  estradiol (VIVELLE-DOT) 0.1 MG/24HR patch PLACE 1 PATCH (0.1 MG TOTAL) ONTO THE SKIN 2 (TWO) TIMES A WEEK. 05/24/15   Regina Eck, CNM  fluticasone (FLONASE) 50 MCG/ACT nasal spray Place 1 spray into both nostrils daily. 01/11/14   Historical Provider, MD  ipratropium-albuterol (DUONEB) 0.5-2.5 (3) MG/3ML SOLN Take 3 mLs by nebulization every 6 (six) hours as needed (asthma).    Historical Provider, MD  levothyroxine (SYNTHROID, LEVOTHROID) 50 MCG tablet Take 50 mcg by mouth daily before breakfast.     Historical Provider, MD  mometasone-formoterol (DULERA) 200-5 MCG/ACT AERO Inhale 2 puffs into the lungs as needed for wheezing (asthma).    Historical Provider, MD  Multiple Vitamin (MULTIVITAMIN WITH MINERALS) TABS Take 1 tablet by mouth every morning.     Historical Provider, MD  Nebulizers (COMPRESSOR/NEBULIZER) MISC Use as directed for asthma 02/03/13   Deneise Lever, MD  Omega-3 Fatty Acids (FISH OIL PO) Take 2,400 mg by mouth daily.    Historical Provider, MD  OVER THE COUNTER MEDICATION Place 1 application onto the skin as needed. Essential oil therapy Anola Gurney, Stress Away    Historical Provider, MD  Spacer/Aero-Holding Chambers ([place]) [place]  01/27/13   Historical Provider, MD  vitamin B-12 (CYANOCOBALAMIN) 1000 MCG tablet Take 1,000 mcg by mouth every morning.     Historical Provider, MD  vitamin C (ASCORBIC ACID) 500 MG tablet Take 1,000 mg by mouth every morning.      Historical Provider, MD   BP 124/76 mmHg  Pulse 63  Temp(Src) 97.7 F (36.5 C) (Oral)  Resp 9  SpO2 96%  LMP 04/08/2014 (Exact Date) Physical Exam  Constitutional: She is oriented to person, place, and time. She appears well-developed and well-nourished. No distress.  HENT:  Head: Normocephalic and atraumatic.  Eyes: Conjunctivae are normal. No scleral icterus.  Neck: Normal range of motion.  Cardiovascular: Normal rate, regular rhythm and normal heart sounds.  Exam reveals no gallop and no friction rub.   No murmur heard. Pulmonary/Chest: Effort normal and breath sounds normal. No respiratory distress.  Abdominal: Soft. Bowel sounds are  normal. She exhibits no distension and no mass. There is no tenderness. There is no guarding.  Neurological: She is alert and oriented to person, place, and time.  Skin: Skin is warm and dry. She is not diaphoretic.  Nursing note and vitals reviewed.   ED Course  Procedures (including critical care time) Labs Review Labs Reviewed  BASIC METABOLIC PANEL - Abnormal; Notable for the following:    Glucose, Bld 134 (*)    All other components within normal limits  CBC - Abnormal; Notable for the following:    RBC 3.53 (*)    Hemoglobin 8.1 (*)    HCT 27.1 (*)    MCV 76.8 (*)    MCH 22.9 (*)    MCHC 29.9 (*)    RDW 16.8 (*)    All other components within normal limits  HEPATIC FUNCTION PANEL  LIPASE, BLOOD  I-STAT TROPOININ, ED  POC OCCULT BLOOD, ED  TYPE AND SCREEN    Imaging Review Dg Chest 2 View  01/19/2016  CLINICAL DATA:  45 year old female with chest pain EXAM: CHEST  2 VIEW COMPARISON:  Chest CT dated 01/17/2013 FINDINGS: The heart size and mediastinal contours are within normal limits. Both lungs are clear. The visualized skeletal structures are unremarkable. IMPRESSION: No active cardiopulmonary disease. Electronically Signed   By: Anner Crete M.D.   On: 01/19/2016 00:32   I have personally reviewed and evaluated these  images and lab results as part of my medical decision-making.   EKG Interpretation   Date/Time:  Saturday Jan 18 2016 23:03:11 EDT Ventricular Rate:  75 PR Interval:  156 QRS Duration: 90 QT Interval:  382 QTC Calculation: 426 R Axis:   78 Text Interpretation:  Normal sinus rhythm Septal infarct , age  undetermined Abnormal ECG No significant change since last tracing  Confirmed by WARD,  DO, KRISTEN ([id number]) on 01/19/2016 1:13:58 AM      MDM   Final diagnoses:  Chest discomfort  Under emotional stress    Patient here with chest discomfort, feeling short of breath. Negative troponin. EKG is unremarkable. I doubt pulmonary embolus as her oxygen saturations are normal, normal respiratory rate and pulse.She has chronic UL LLE edema that is unchanged and has had multiple DVT workups Heart score of 2 with multiple previous work ups without sig abnormalities. Apparent initial hgb on cbc was a lab error. HGB appears at baseline. I have a high suspicion that anxiety and sleep deprivation are playing a sig role in the patient's sxs today.     Margarita Mail, PA-C 01/19/16 [place], DO 01/20/16 0005

## 2016-04-03 ENCOUNTER — Ambulatory Visit (INDEPENDENT_AMBULATORY_CARE_PROVIDER_SITE_OTHER): Payer: BC Managed Care – PPO | Admitting: Nurse Practitioner

## 2016-04-03 ENCOUNTER — Encounter: Payer: Self-pay | Admitting: Nurse Practitioner

## 2016-04-03 VITALS — BP 118/72 | HR 60 | Ht 62.5 in | Wt 184.0 lb

## 2016-04-03 DIAGNOSIS — Z0000 Encounter for general adult medical examination without abnormal findings: Secondary | ICD-10-CM | POA: Diagnosis not present

## 2016-04-03 DIAGNOSIS — Z01419 Encounter for gynecological examination (general) (routine) without abnormal findings: Secondary | ICD-10-CM | POA: Diagnosis not present

## 2016-04-03 MED ORDER — ESTRADIOL 0.1 MG/24HR TD PTTW: MEDICATED_PATCH | TRANSDERMAL | 4 refills | Status: DC

## 2016-04-03 NOTE — Progress Notes (Signed)
Reviewed personally.  M. Suzanne Shalandra Leu, MD.  

## 2016-04-03 NOTE — Patient Instructions (Signed)

## 2016-04-03 NOTE — Progress Notes (Signed)
Patient ID: Destiny Sharp, female   DOB: 1971/07/12, 45 y.o.   MRN: [id number]  45 y.o. G0P0000 Single  Caucasian Fe here for annual exam.  Recent flare of asthma with bronchitis in the spring.  She has sleep apnea and not using her C pap as directed.  In addition she and her brother are restoring a farm house that her grandfather built in 1878.  They are doing the work themselves and she has been out of doors a lot which flares her allergies.  She is doing well from hysterectomy for the most part with only minor twinges of pain right lower abdomen when she lifts or turns a certain way.  She thinks this is from adhesions and pain is only intermittent.  She is doing well with vaso symptoms on the Vivelle patch and wants to continue.  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  Patient's last menstrual period was 04/08/2014 (exact date).          Sexually active: No. never SA The current method of family planning is abstinence.    Exercising: Yes.    walking and yard work Smoker:  no  Health Maintenance: Pap:  03/16/14, Negative with neg HR HPV MMG:  04/09/15, Bi-Rads 1: negative  TDaP:   UTD with PCP HIV: will get done at PCP  Labs: HB: 01/18/16 in EPIC 8.1, pt was told this was lab error   reports that she has never smoked. She has never used smokeless tobacco. She reports that she does not drink alcohol or use drugs.  Past Medical History:  Diagnosis Date  . Allergy   . Anxiety    no meds  . Asthma   . Bronchitis    one or two times/year  . Edema    LLE  . Elevated liver function tests   . Fibromyalgia   . Headache(784.0)    otc med prn  . Hx of cardiovascular stress test 11/22/12   Lex MV 2/14: EF 81%, no ischemia  . Hyperlipidemia    diet controlled - no med  . Hypothyroidism   . Pneumonia    hx  . PONV (postoperative nausea and vomiting)   . Scoliosis    history  . Sleep apnea    Does not use CPAP every night  . Vasovagal syncope    one episode only    Past  Surgical History:  Procedure Laterality Date  . APPENDECTOMY    . CYSTOSCOPY  05/22/2014   Procedure: CYSTOSCOPY;  Surgeon: Jamey Reas de Berton Lan, MD;  Location: [facility];  Service: Gynecology;;  . MOUTH SURGERY     wisdom teeth ext  . NASAL SINUS SURGERY     x 3  . ROBOTIC ASSISTED LAPAROSCOPIC LYSIS OF ADHESION Bilateral 05/22/2014   Procedure: XI ROBOTIC ASSISTED LAPAROSCOPIC LYSIS OF extensive ADHESION;  Surgeon: Jamey Reas de Berton Lan, MD;  Location: [facility];  Service: Gynecology;  Laterality: Bilateral;  . ROBOTIC ASSISTED TOTAL HYSTERECTOMY WITH BILATERAL SALPINGO OOPHERECTOMY N/A 05/22/2014   Procedure: ROBOTIC ASSISTED TOTAL HYSTERECTOMY WITH BILATERAL SALPINGO OOPHORECTOMY and cystoscopy;  Surgeon: Jamey Reas de Berton Lan, MD;  Location: [facility];  Service: Gynecology;  Laterality: N/A;    Current Outpatient Prescriptions  Medication Sig Dispense Refill  . albuterol (PROVENTIL HFA;VENTOLIN HFA) 108 (90 BASE) MCG/ACT inhaler Inhale 2 puffs into the lungs every 4 (four) hours as needed for wheezing or shortness of breath. 1 Inhaler  prn  . calcium carbonate (OS-CAL - DOSED IN MG OF ELEMENTAL CALCIUM) 1250 MG tablet Take 1 tablet by mouth daily.    . DENTA 5000 PLUS 1.1 % CREA dental cream Place 1 application onto teeth at bedtime as needed (dental care).     Marland Kitchen estradiol (VIVELLE-DOT) 0.1 MG/24HR patch PLACE 1 PATCH (0.1 MG TOTAL) ONTO THE SKIN 2 (TWO) TIMES A WEEK. 8 patch 11  . fluticasone (FLONASE) 50 MCG/ACT nasal spray Place 1 spray into both nostrils daily as needed for allergies.     . hydrOXYzine (ATARAX/VISTARIL) 25 MG tablet Take 1 tablet (25 mg total) by mouth every 6 (six) hours as needed for anxiety. 12 tablet 0  . ipratropium-albuterol (DUONEB) 0.5-2.5 (3) MG/3ML SOLN Take 3 mLs by nebulization every 6 (six) hours as needed (asthma).    Marland Kitchen levothyroxine (SYNTHROID, LEVOTHROID) 50 MCG tablet Take 50 mcg by mouth daily before breakfast.      . mometasone-formoterol (DULERA) 200-5 MCG/ACT AERO Inhale 2 puffs into the lungs as needed for wheezing (asthma).    . Multiple Vitamin (MULTIVITAMIN WITH MINERALS) TABS Take 1 tablet by mouth every morning.     . Nebulizers (COMPRESSOR/NEBULIZER) MISC Use as directed for asthma 1 each prn  . Omega-3 Fatty Acids (FISH OIL PO) Take 2,400 mg by mouth daily.    Marland Kitchen OVER THE COUNTER MEDICATION Place 1 application onto the skin as needed. Essential oil therapy Rosemary, Eucaliptus, Stress Away    . Spacer/Aero-Holding Chambers ([place]) MISC     . vitamin B-12 (CYANOCOBALAMIN) 1000 MCG tablet Take 1,000 mcg by mouth every morning.     . vitamin C (ASCORBIC ACID) 500 MG tablet Take 1,000 mg by mouth every morning.      No current facility-administered medications for this visit.     Family History  Problem Relation Age of Onset  . CAD Maternal Grandmother   . Ovarian cancer Mother 71    ovarian  . Heart disease Maternal Grandfather   . Heart disease Paternal Grandfather   . Heart disease Paternal Grandmother   . Heart disease Father   . Breast cancer Maternal Aunt   . Emphysema Maternal Uncle     ROS:  Pertinent items are noted in HPI.  Otherwise, a comprehensive ROS was negative.  Exam:   LMP 04/08/2014 (Exact Date)    Ht Readings from Last 3 Encounters:  03/25/15 5' 2.25" (1.581 m)  01/21/15 5\' 2"  (1.575 m)  10/26/14 5\' 2"  (1.575 m)    General appearance: alert, cooperative and appears stated age Head: Normocephalic, without obvious abnormality, atraumatic Neck: no adenopathy, supple, symmetrical, trachea midline and thyroid normal to inspection and palpation Lungs: clear to auscultation bilaterally Breasts: normal appearance, no masses or tenderness Heart: regular rate and rhythm Abdomen: soft, non-tender; no masses,  no organomegaly Extremities: extremities normal, atraumatic, no cyanosis or edema Skin: Skin color, texture, turgor normal. No rashes or  lesions Lymph nodes: Cervical, supraclavicular, and axillary nodes normal. No abnormal inguinal nodes palpated Neurologic: Grossly normal   Pelvic: External genitalia:  no lesions              Urethra:  normal appearing urethra with no masses, tenderness or lesions              Bartholin's and Skene's: normal                 Vagina: normal appearing vagina with normal color and discharge, no lesions  Cervix: absent              Pap taken: No. Bimanual Exam:  Uterus:  uterus absent              Adnexa: no mass, fullness, tenderness               Rectovaginal: Confirms               Anus:  normal sphincter tone, no lesions  Chaperone present: no  A:  Well Woman with normal exam             S/P Robotic assisted TAH/BSO with LOA 05/22/14             ERT started 06/2014              [facility]: mother with OV cancer, MA with breast cancer              Hypothyroid                          P:   Reviewed health and wellness pertinent to exam  Pap smear not done  Mammogram is due in August and will schedule  Refill on Vivelle dot 0.1 mg twice a week.  Counseled with risk of DVT, CVA, cancer, etc  She has declined genetic testing  Counseled on breast self exam, mammography screening, use and side effects of HRT, adequate intake of calcium and vitamin D, diet and exercise return annually or prn  An After Visit Summary was printed and given to the patient.

## 2016-04-09 ENCOUNTER — Other Ambulatory Visit: Payer: Self-pay | Admitting: Nurse Practitioner

## 2016-04-09 DIAGNOSIS — Z1231 Encounter for screening mammogram for malignant neoplasm of breast: Secondary | ICD-10-CM

## 2016-04-15 ENCOUNTER — Telehealth: Payer: Self-pay | Admitting: Internal Medicine

## 2016-04-15 ENCOUNTER — Ambulatory Visit
Admission: RE | Admit: 2016-04-15 | Discharge: 2016-04-15 | Disposition: A | Discharge status: Home / Self Care | Payer: BC Managed Care – PPO | Source: Ambulatory Visit | Attending: Nurse Practitioner | Admitting: Nurse Practitioner

## 2016-04-15 DIAGNOSIS — Z1231 Encounter for screening mammogram for malignant neoplasm of breast: Secondary | ICD-10-CM

## 2016-04-15 DIAGNOSIS — G4733 Obstructive sleep apnea (adult) (pediatric): Secondary | ICD-10-CM

## 2016-04-15 NOTE — Telephone Encounter (Signed)
Called spoke with pt. She reports last year she had her CPAP adjusted to 12 cm. She reports now the pressure is blowing the mask off her face. Pt has not been seen since 07/06/14. No pending appt. Pt reports she is trying actively trying to lose weight. She is requesting an order to be changed back to 10cm. Please advise Dr. Annamaria Boots thanks

## 2016-04-15 NOTE — Telephone Encounter (Signed)
Ok to order DME please change CPAP to 10 as requested.

## 2016-04-16 NOTE — Telephone Encounter (Signed)
Spoke with pt and advised that [place] approved CPAP pressure change. Order sent to [place]. Pt advised to call uf this does not help. Nothing further needed.

## 2016-04-20 ENCOUNTER — Other Ambulatory Visit: Payer: Self-pay | Admitting: Nurse Practitioner

## 2016-04-20 ENCOUNTER — Telehealth: Payer: Self-pay | Admitting: Nurse Practitioner

## 2016-04-20 DIAGNOSIS — R928 Other abnormal and inconclusive findings on diagnostic imaging of breast: Secondary | ICD-10-CM

## 2016-04-20 NOTE — Telephone Encounter (Signed)
Spoke with patient. Patient is calling to review her mammogram results from 04/15/2016. States she was contacted and is scheduled for left breast diagnostic imaging with ultrasound on 04/23/2016 at 9:20 am. Reviewed results with patient as seen below please see results in EPIC. All questions answered. Patient is agreeable and verbalizes understanding. Patient is in mammogram hold  FINDINGS: In the left breast, a possible asymmetry warrants further evaluation. In the right breast, no findings suspicious for malignancy. Images were processed with CAD.  IMPRESSION: Further evaluation is suggested for possible asymmetry in the left breast.  RECOMMENDATION: Diagnostic mammogram and possibly ultrasound of the left breast. (Code:FI-L-38M)  Routing to provider for final review. Patient agreeable to disposition. Will close encounter.

## 2016-04-20 NOTE — Telephone Encounter (Signed)
Patient wants to speak with the nurse concerning a call she received from the breast center.

## 2016-04-23 ENCOUNTER — Ambulatory Visit
Admission: RE | Admit: 2016-04-23 | Discharge: 2016-04-23 | Disposition: A | Discharge status: Home / Self Care | Payer: BC Managed Care – PPO | Source: Ambulatory Visit | Attending: Nurse Practitioner | Admitting: Nurse Practitioner

## 2016-04-23 DIAGNOSIS — R928 Other abnormal and inconclusive findings on diagnostic imaging of breast: Secondary | ICD-10-CM

## 2016-07-22 ENCOUNTER — Telehealth: Payer: Self-pay | Admitting: Internal Medicine

## 2016-07-23 NOTE — Telephone Encounter (Signed)
15 minute slot will be fine. Please make sure we get download or AirView compliance and pressure report.

## 2016-07-23 NOTE — Telephone Encounter (Signed)
Called and spoke with pt and she is aware of appt with CY on 12/12 at 1030.  [place] per [employer].  Pt stated that she has not been using her cpap in the last couple of months---only a couple of times since she has had a lot going on.  She stated that the setting is at 10 and this works better for her.  Will make katie aware. Pt stated that she uses [facility] in [place].

## 2016-07-23 NOTE — Telephone Encounter (Signed)
lmtcb X1 Will await call back.

## 2016-07-23 NOTE — Telephone Encounter (Signed)
Pt states that she needs an appt with CY to follow up for CPAP (DOT related) and also for pulmonary issues - asthma. Pt requesting an appt in December. Pt states that if she does not have this done then she will be out of a job until she has a DOT check for OSA.  Please advise Dr Annamaria Boots where we can schedule this patient. There are a few openings on your scheduled in December but they are only 15 min slots. Thanks.

## 2016-08-18 ENCOUNTER — Encounter: Payer: Self-pay | Admitting: Internal Medicine

## 2016-08-18 ENCOUNTER — Ambulatory Visit (INDEPENDENT_AMBULATORY_CARE_PROVIDER_SITE_OTHER): Payer: BC Managed Care – PPO | Admitting: Internal Medicine

## 2016-08-18 DIAGNOSIS — G4733 Obstructive sleep apnea (adult) (pediatric): Secondary | ICD-10-CM

## 2016-08-18 DIAGNOSIS — R0602 Shortness of breath: Secondary | ICD-10-CM

## 2016-08-18 NOTE — Assessment & Plan Note (Signed)
Acute bronchitis now resolving. We discussed ways to avoid colds this winter.

## 2016-08-18 NOTE — Progress Notes (Signed)
HPI Allergy skin test 03/07/14- significant positive intradermal tests female never smoker followed for Asthma, episodic dyspnea, restrictive PFT, hyperventilation, OSA, anxiety, history abnormal CT Office spirometry 10/27/12- FVC2.32/ 71%, FEV1 2.02/ 73%, FEV1/FVC 0.87, FEF 25-75% 94% NPSG- 12/15/10- Dr Jaynie [facility]- RDI 6.8/ hr. CPAP 10,, [facility] DME, full face mask, humidifier ECHO [id number]- Gr 2 diastolic dysfunction/CHF, no PHTN PFT 02/03/13- severe restriction of total lung capacity, normal spirometry flows for volume with insignificant response to bronchodilator, diffusion moderately reduced. TLC 49%, DLCO 62%. FVC 2.19/62%, FEV1 1.95/68%, FEV1/FVC 89/108%, FEF 25-75% 88/104%. 6 minute walk test 02/03/2013-98%, 99%, 99%, 303 m. Methacholine inhalation challenge test 01/27/2013 positive for hyperreactive airways at stage I.0 female never smoker followed for Asthma, episodic dyspnea, restrictive PFT, hyperventilation, OSA, anxiety, history abnormal CT ABG 10/12/12 (? FiO2), pH 7.609, PCO2 20.3, PO2 122, HCO3 20.5- consistent w/ acute respiratory alkalosis. -----------------------------------------------------    07/06/14- 43 yoF never smoker followed for asthma, episodic dyspnea, restrictive PFT,  hyperventilation, OSA, anxiety  Hx abnormal CT FOLLOWS FOR: states she is doing well, has only been at work for 3 weeks so far this school year d/t another procedure.   She had hysterectomy, then fell at home and has been put out of work until November 12. Breathing okay. Sneezes when she uses [employer]. 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 We reviewed results of her allergy testing from July and agreed to try to make conservative measures work.  08/18/2016-45 year old female never smoker followed for Asthma, episodic dyspnea, restrictive PFT, hyperventilation, OSA, anxiety, history abnormal CT CPAP 10/ [facility] FOLLOWS FOR: Pt wears CPAP inconsistently recently. Pt states that after  moving pressure from 12 to 10 she noticed a big improvement. DME: [facility];  Pt states that a few weeks ago she had a terrible URI which left her with a cough and increased SOB. Pt completed Prednisone x 2 weeks ago. No download available. 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 She dropped off CPAP while dealing with a sustained bronchitis but is ready to restart. Sleeping in recliner and CPAP mask would not seal in that position. Says the bronchitis is largely resolved now after several rounds of prednisone, Augmentin. CXR 01/19/2016-NAD  ROS-see HPI Constitutional:   No-   weight loss, night sweats, fevers, chills, +fatigue, lassitude. HEENT:   No-  headaches, difficulty swallowing, tooth/dental problems, sore throat,       itching, no-ear ache, +nasal congestion, +post nasal drip,  CV:  No-chest pain/ pressure, no-orthopnea, PND, swelling in lower extremities, anasarca,                                               dizziness, palpitations Resp: +  shortness of breath with exertion or at rest.              No-   productive cough,  No non-productive cough,  No- coughing up of blood.              No-   change in color of mucus.  No- wheezing.   Skin: No-   rash or lesions. GI:  No-   heartburn, indigestion, abdominal pain, nausea, vomiting, GU:  MS:  No-   joint pain or swelling. + back pain Neuro-     nothing unusual Psych:  No- change in mood or affect. No depression + anxiety.  No memory loss.  OBJ- Physical Exam   General- Alert, Oriented, Affect- calm, Skin- clear Lymphadenopathy- none Head- atraumatic            Eyes- Gross vision intact, PERRLA, conjunctivae and secretions clear            Ears- Hearing, canals-normal            Nose- Clear, no-Septal dev, mucus, polyps, erosion, perforation             Throat- Mallampati III , mucosa clear , drainage- none, tonsils- atrophic Neck- flexible , trachea midline, no stridor , thyroid nl, carotid no  bruit Chest - symmetrical excursion , unlabored           Heart/CV- RRR , no murmur , no gallop  , no rub, nl s1 s2                           - JVD- none , edema- none, stasis changes- none, varices- none           Lung- clear to P&A, wheeze- none, cough- none , dullness-none, rub- none.           Chest wall-  Abd-  Br/ Gen/ Rectal- Not done, not indicated Extrem- cyanosis- none, clubbing, none, atrophy- none, strength- nl Neuro- grossly intact to observation

## 2016-08-18 NOTE — Patient Instructions (Addendum)
Ok to continue CPAP 10/ [facility], mask of choice, humidifier, supplies, AirView    Dx OSA  Please resume full use of your CPAP. You can get the download for the documentation that your DOT physical needs.

## 2016-08-18 NOTE — Assessment & Plan Note (Signed)
She is restarting CPAP after an acute bronchitis. 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 We reinforced her responsibility to drive safely, basic sleep hygiene today. She continues CPAP 10.

## 2016-09-08 ENCOUNTER — Emergency Department (HOSPITAL_COMMUNITY): Payer: BC Managed Care – PPO

## 2016-09-08 ENCOUNTER — Emergency Department (HOSPITAL_COMMUNITY)
Admission: EM | Admit: 2016-09-08 | Discharge: 2016-09-08 | Disposition: A | Discharge status: Home / Self Care | Payer: BC Managed Care – PPO | Attending: Emergency Medicine | Admitting: Emergency Medicine

## 2016-09-08 ENCOUNTER — Encounter (HOSPITAL_COMMUNITY): Payer: Self-pay | Admitting: Emergency Medicine

## 2016-09-08 DIAGNOSIS — R05 Cough: Secondary | ICD-10-CM | POA: Diagnosis present

## 2016-09-08 DIAGNOSIS — J189 Pneumonia, unspecified organism: Secondary | ICD-10-CM

## 2016-09-08 DIAGNOSIS — J181 Lobar pneumonia, unspecified organism: Secondary | ICD-10-CM | POA: Diagnosis not present

## 2016-09-08 DIAGNOSIS — J45909 Unspecified asthma, uncomplicated: Secondary | ICD-10-CM | POA: Insufficient documentation

## 2016-09-08 DIAGNOSIS — E039 Hypothyroidism, unspecified: Secondary | ICD-10-CM | POA: Insufficient documentation

## 2016-09-08 DIAGNOSIS — I10 Essential (primary) hypertension: Secondary | ICD-10-CM | POA: Insufficient documentation

## 2016-09-08 MED ORDER — AZITHROMYCIN 250 MG PO TABS: 250.0000 mg | ORAL_TABLET | Freq: Every day | ORAL | 0 refills | Status: DC

## 2016-09-08 MED ORDER — DEXTROSE 5 % IV SOLN
1.0000 g | Freq: Once | INTRAVENOUS | Status: AC
  Administered 2016-09-08: 1 g via INTRAVENOUS
  Filled 2016-09-08: qty 10

## 2016-09-08 MED ORDER — AZITHROMYCIN 250 MG PO TABS
500.0000 mg | ORAL_TABLET | Freq: Once | ORAL | Status: AC
  Administered 2016-09-08: 500 mg via ORAL
  Filled 2016-09-08: qty 2

## 2016-09-08 NOTE — ED Provider Notes (Signed)
[facility] DEPT Provider Note   CSN: [id number] Arrival date & time: 09/08/16  1151     History   Chief Complaint Chief Complaint  Patient presents with  . flu like symptoms    HPI Destiny Sharp is a 46 y.o. female.  The history is provided by the patient. No language interpreter was used.  Cough  This is a new problem. The current episode started more than 1 week ago. The problem occurs constantly. The problem has been gradually worsening. The cough is productive of sputum. There has been no fever. Associated symptoms include rhinorrhea and shortness of breath. She has tried nothing for the symptoms. The treatment provided no relief. She is not a smoker. Her past medical history does not include pneumonia.    Past Medical History:  Diagnosis Date  . Allergy   . Anxiety    no meds  . Asthma   . Bronchitis    one or two times/year  . Edema    LLE  . Elevated liver function tests   . Fibromyalgia   . Headache(784.0)    otc med prn  . Hx of cardiovascular stress test 11/22/12   Lex MV 2/14: EF 81%, no ischemia  . Hyperlipidemia    diet controlled - no med  . Hypothyroidism   . Pneumonia    hx  . PONV (postoperative nausea and vomiting)   . Scoliosis    history  . Sleep apnea    Does not use CPAP every night  . Vasovagal syncope    one episode only    Patient Active Problem List   Diagnosis Date Noted  . Seasonal and perennial allergic rhinitis 07/07/2014  . Status post laparoscopic hysterectomy 05/22/2014  . Vasovagal attack 03/07/2014  . Obstructive sleep apnea 01/17/2013  . Abnormal CT of the chest 10/30/2012  . Chest pain 10/07/2012  . Hypothyroidism   . Hypertension   . SOB (shortness of breath)   . Fatigue   . Edema     Past Surgical History:  Procedure Laterality Date  . APPENDECTOMY    . CYSTOSCOPY  05/22/2014   Procedure: CYSTOSCOPY;  Surgeon: Jamey Reas de Berton Lan, MD;  Location: [facility];  Service: Gynecology;;  . MOUTH  SURGERY     wisdom teeth ext  . NASAL SINUS SURGERY     x 3  . ROBOTIC ASSISTED LAPAROSCOPIC LYSIS OF ADHESION Bilateral 05/22/2014   Procedure: XI ROBOTIC ASSISTED LAPAROSCOPIC LYSIS OF extensive ADHESION;  Surgeon: Jamey Reas de Berton Lan, MD;  Location: [facility];  Service: Gynecology;  Laterality: Bilateral;  . ROBOTIC ASSISTED TOTAL HYSTERECTOMY WITH BILATERAL SALPINGO OOPHERECTOMY N/A 05/22/2014   Procedure: ROBOTIC ASSISTED TOTAL HYSTERECTOMY WITH BILATERAL SALPINGO OOPHORECTOMY and cystoscopy;  Surgeon: Jamey Reas de Berton Lan, MD;  Location: [facility];  Service: Gynecology;  Laterality: N/A;    OB History    Gravida Para Term Preterm AB Living   0 0 0 0 0 0   SAB TAB Ectopic Multiple Live Births   0 0 0 0 0       Home Medications    Prior to Admission medications   Medication Sig Start Date End Date Taking? Authorizing Provider  calcium carbonate (OS-CAL - DOSED IN MG OF ELEMENTAL CALCIUM) 1250 MG tablet Take 1 tablet by mouth daily.   Yes Historical Provider, MD  doxycycline (VIBRAMYCIN) 100 MG capsule Take 100 mg by mouth 2 (two) times daily. 09/04/16  Yes Historical Provider, MD  estradiol (VIVELLE-DOT) 0.1 MG/24HR patch PLACE 1 PATCH (0.1 MG TOTAL) ONTO THE SKIN 2 (TWO) TIMES A WEEK. 04/03/16  Yes Kem Boroughs, FNP  fluticasone (FLONASE) 50 MCG/ACT nasal spray Place 1 spray into both nostrils daily as needed for allergies.  01/11/14  Yes Historical Provider, MD  ipratropium-albuterol (DUONEB) 0.5-2.5 (3) MG/3ML SOLN Take 3 mLs by nebulization every 6 (six) hours as needed (asthma).   Yes Historical Provider, MD  levothyroxine (SYNTHROID, LEVOTHROID) 50 MCG tablet Take 50 mcg by mouth daily before breakfast.    Yes Historical Provider, MD  mirabegron ER (MYRBETRIQ) 50 MG TB24 tablet Take 50 mg by mouth daily.   Yes Historical Provider, MD  Multiple Vitamin (MULTIVITAMIN WITH MINERALS) TABS Take 1 tablet by mouth every morning.    Yes Historical Provider, MD    Omega-3 Fatty Acids (FISH OIL PO) Take 2,400 mg by mouth daily.   Yes Historical Provider, MD  vitamin B-12 (CYANOCOBALAMIN) 1000 MCG tablet Take 1,000 mcg by mouth every morning.    Yes Historical Provider, MD  vitamin C (ASCORBIC ACID) 500 MG tablet Take 1,000 mg by mouth every morning.    Yes Historical Provider, MD  albuterol (PROVENTIL HFA;VENTOLIN HFA) 108 (90 BASE) MCG/ACT inhaler Inhale 2 puffs into the lungs every 4 (four) hours as needed for wheezing or shortness of breath. 02/03/13   Deneise Lever, MD  DENTA 5000 PLUS 1.1 % CREA dental cream Place 1 application onto teeth at bedtime as needed (dental care).  05/03/14   Historical Provider, MD  Nebulizers (COMPRESSOR/NEBULIZER) MISC Use as directed for asthma 02/03/13   Deneise Lever, MD  OVER THE COUNTER MEDICATION Place 1 application onto the skin as needed. Essential oil therapy Anola Gurney, Stress Away    Historical Provider, MD  Spacer/Aero-Holding Chambers ([place]) [place]  01/27/13   Historical Provider, MD    Family History Family History  Problem Relation Age of Onset  . CAD Maternal Grandmother   . Ovarian cancer Mother 99    ovarian  . Heart disease Maternal Grandfather   . Heart disease Paternal Grandfather   . Heart disease Paternal Grandmother   . Heart disease Father   . Breast cancer Maternal Aunt   . Emphysema Maternal Uncle     Social History Social History  Substance Use Topics  . Smoking status: Never Smoker  . Smokeless tobacco: Never Used  . Alcohol use No     Allergies   Codeine; Hepatitis b virus vaccine; Imitrex [sumatriptan]; and Pneumococcal vaccines   Review of Systems Review of Systems  HENT: Positive for rhinorrhea.   Respiratory: Positive for cough and shortness of breath.   All other systems reviewed and are negative.    Physical Exam Updated Vital Signs BP 121/76 (BP Location: Right Arm)   Pulse 97   Temp 97.9 F (36.6 C) (Oral)   Resp 18   LMP  05/22/2014 (Exact Date)   SpO2 95%   Physical Exam  Constitutional: She appears well-developed and well-nourished. No distress.  HENT:  Head: Normocephalic and atraumatic.  Mouth/Throat: Oropharynx is clear and moist.  Eyes: Conjunctivae are normal.  Neck: Neck supple.  Cardiovascular: Normal rate and regular rhythm.   No murmur heard. Pulmonary/Chest: Effort normal and breath sounds normal. No respiratory distress.  Abdominal: Soft. There is no tenderness.  Musculoskeletal: Normal range of motion. She exhibits no edema.  Neurological: She is alert.  Skin: Skin is warm and dry.  Psychiatric:  She has a normal mood and affect.  Nursing note and vitals reviewed.    ED Treatments / Results  Labs (all labs ordered are listed, but only abnormal results are displayed) Labs Reviewed - No data to display  EKG  EKG Interpretation None       Radiology Dg Chest 2 View  Result Date: 09/08/2016 CLINICAL DATA:  Asthma attack over the past week associated with coughing and chest congestion and shortness of breath. EXAM: CHEST  2 VIEW COMPARISON:  PA and lateral chest x-ray of Jan 19, 2016 FINDINGS: The lungs are mildly hypoinflated. There is increased density at the left lung base posteriorly. The right lung is clear. The heart and pulmonary vascularity are normal. The mediastinum is normal in width. The bony thorax exhibits no acute abnormality. IMPRESSION: Left lower lobe pneumonia. Followup PA and lateral chest X-ray is recommended in 3-4 weeks following trial of antibiotic therapy to ensure resolution. Electronically Signed   By: David  Martinique M.D.   On: 09/08/2016 12:58    Procedures Procedures (including critical care time)  Medications Ordered in ED Medications  cefTRIAXone (ROCEPHIN) 1 g in dextrose 5 % 50 mL IVPB (not administered)  azithromycin (ZITHROMAX) tablet 500 mg (500 mg Oral Given 09/08/16 1647)     Initial Impression / Assessment and Plan / ED Course  I have  reviewed the triage vital signs and the nursing notes.  Pertinent labs & imaging results that were available during my care of the patient were reviewed by me and considered in my medical decision making (see chart for details).  Clinical Course     Chest xray shows left lower lobe pneumonia.  Pt advised of results.  Pt given rocephin iv and zithromax po.  Pt advised she needs repeat chest xray in 3-4 weeks.  Final Clinical Impressions(s) / ED Diagnoses   Final diagnoses:  Community acquired pneumonia of left lower lobe of lung (HCC)    New Prescriptions New Prescriptions   AZITHROMYCIN (ZITHROMAX) 250 MG TABLET    Take 1 tablet (250 mg total) by mouth daily. Take first 2 tablets together, then 1 every day until finished.     Hollace Kinnier [place], PA-C 09/08/16 [place], MD 09/09/16 [phone]

## 2016-09-08 NOTE — ED Triage Notes (Signed)
Per EMS states  Flu like symptoms, cough, chills for over a week was treated at [facility] and went to PCP today because she has not improved

## 2016-09-08 NOTE — ED Notes (Signed)
Patient was alert, oriented and stable upon discharge. RN went over AVS and patient had no further questions.  

## 2016-09-08 NOTE — Discharge Instructions (Signed)
Return if any problems.  See your Physician for recheck in 1 week.  You need to have a repeat chest xray in 3-4 weeks.

## 2017-03-08 ENCOUNTER — Encounter: Payer: Self-pay | Admitting: Nurse Practitioner

## 2017-03-30 ENCOUNTER — Other Ambulatory Visit: Payer: Self-pay | Admitting: Obstetrics and Gynecology

## 2017-03-30 DIAGNOSIS — Z1231 Encounter for screening mammogram for malignant neoplasm of breast: Secondary | ICD-10-CM

## 2017-04-06 ENCOUNTER — Ambulatory Visit: Payer: BC Managed Care – PPO | Admitting: Nurse Practitioner

## 2017-04-19 ENCOUNTER — Ambulatory Visit
Admission: RE | Admit: 2017-04-19 | Discharge: 2017-04-19 | Disposition: A | Discharge status: Home / Self Care | Payer: BC Managed Care – PPO | Source: Ambulatory Visit | Attending: Obstetrics and Gynecology | Admitting: Obstetrics and Gynecology

## 2017-04-19 DIAGNOSIS — Z1231 Encounter for screening mammogram for malignant neoplasm of breast: Secondary | ICD-10-CM

## 2017-04-26 ENCOUNTER — Other Ambulatory Visit: Payer: Self-pay

## 2017-04-26 MED ORDER — ESTRADIOL 0.1 MG/24HR TD PTTW: MEDICATED_PATCH | TRANSDERMAL | 1 refills | Status: DC

## 2017-04-26 NOTE — Telephone Encounter (Signed)
Medication refill request: Estradiol patch Last AEX:  04/03/16 PG Next AEX: 09/03/17 BS Last MMG (if hormonal medication request): 04/19/17 BIRADS 1 negative/density b Refill authorized: 04/03/16 #24 w/4 refills; today please advise

## 2017-08-23 ENCOUNTER — Ambulatory Visit: Payer: BC Managed Care – PPO | Admitting: Internal Medicine

## 2017-09-03 ENCOUNTER — Ambulatory Visit: Payer: BC Managed Care – PPO | Admitting: Obstetrics and Gynecology

## 2017-11-15 ENCOUNTER — Other Ambulatory Visit: Payer: Self-pay | Admitting: Obstetrics and Gynecology

## 2017-11-15 NOTE — Telephone Encounter (Signed)
Medication refill request: Estradiol patch Last AEX:  04-03-16 Next AEX: 12-14-17 Last MMG (if hormonal medication request): 04-19-17 [id number] Refill authorized: Please advise

## 2017-12-06 ENCOUNTER — Encounter: Payer: Self-pay | Admitting: Internal Medicine

## 2017-12-06 ENCOUNTER — Ambulatory Visit: Payer: BC Managed Care – PPO | Admitting: Internal Medicine

## 2017-12-06 VITALS — BP 118/74 | HR 84 | Ht 62.5 in | Wt 181.4 lb

## 2017-12-06 DIAGNOSIS — J4520 Mild intermittent asthma, uncomplicated: Secondary | ICD-10-CM

## 2017-12-06 DIAGNOSIS — G4733 Obstructive sleep apnea (adult) (pediatric): Secondary | ICD-10-CM

## 2017-12-06 MED ORDER — ALBUTEROL SULFATE HFA 108 (90 BASE) MCG/ACT IN AERS: 2.0000 | INHALATION_SPRAY | RESPIRATORY_TRACT | 99 refills | Status: DC | PRN

## 2017-12-06 NOTE — Assessment & Plan Note (Signed)
No sleep disturbance and rare need for rescue inhaler.  No major exacerbations. Plan-refill albuterol rescue inhaler to keep it available

## 2017-12-06 NOTE — Patient Instructions (Addendum)
Order- DME [facility]-  Please replace mask of choice, and supplies,   If eligible, please replace old CPAP machine, change to auto 5-15, mask of choice, humidifier, supplies, AirView    Dx OSA  Please call if we can help

## 2017-12-06 NOTE — Assessment & Plan Note (Signed)
She definitely benefits from CPAP.  We discussed her habit of letting herself fall asleep in a recliner, leading to average about 4 hours in bed with her machine on.  She seems to be rested during the day and has a busy schedule.  Not having any problems being alert while driving.  She is meeting minimal goals for CPAP compliance.  Her machine is getting older and we discussed replacing it before it breaks, taking the opportunity to change to AutoPap 5-15.

## 2017-12-06 NOTE — Progress Notes (Signed)
HPI Allergy skin test 03/07/14- significant positive intradermal tests female never smoker followed for Asthma, episodic dyspnea, restrictive PFT, hyperventilation, OSA, anxiety, history abnormal CT Office spirometry 10/27/12- FVC2.32/ 71%, FEV1 2.02/ 73%, FEV1/FVC 0.87, FEF 25-75% 94% NPSG- 12/15/10- Dr Jaynie [facility]- RDI 6.8/ hr. CPAP 10,, [facility] DME, full face mask, humidifier ECHO 0/97/35- Gr 2 diastolic dysfunction/CHF, no PHTN PFT 02/03/13- severe restriction of total lung capacity, normal spirometry flows for volume with insignificant response to bronchodilator, diffusion moderately reduced. TLC 49%, DLCO 62%. FVC 2.19/62%, FEV1 1.95/68%, FEV1/FVC 89/108%, FEF 25-75% 88/104%. 6 minute walk test 02/03/2013-98%, 99%, 99%, 303 m. Methacholine inhalation challenge test 01/27/2013 positive for hyperreactive airways at stage I.0 female never smoker followed for Asthma, episodic dyspnea, restrictive PFT, hyperventilation, OSA, anxiety, history abnormal CT ABG 10/12/12 (? FiO2), pH 7.609, PCO2 20.3, PO2 122, HCO3 20.5- consistent w/ acute respiratory alkalosis. -----------------------------------------------------.  08/18/2016-47 year old female never smoker followed for Asthma, episodic dyspnea, restrictive PFT, hyperventilation, OSA, anxiety, history abnormal CT CPAP 10/ [facility] FOLLOWS FOR: Pt wears CPAP inconsistently recently. Pt states that after moving pressure from 12 to 10 she noticed a big improvement. DME: [facility];  Pt states that a few weeks ago she had a terrible URI which left her with a cough and increased SOB. Pt completed Prednisone x 2 weeks ago. No download available. 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 She dropped off CPAP while dealing with a sustained bronchitis but is ready to restart. Sleeping in recliner and CPAP mask would not seal in that position. Says the bronchitis is largely resolved now after several rounds of prednisone,  Augmentin. CXR 01/19/2016-NAD  12/06/2017- 47 year old female never smoker followed for Asthma, episodic dyspnea, restrictive PFT, hyperventilation, OSA, anxiety, history abnormal CT, HBP CPAP 10/ [facility]>> today  Replace old machine, auto 5-15 ----OSA: DME: [facility]. Pt wears CPAP nightly.   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  Denies new health problems. Asked refill pro-air/albuterol to keep available.  Rare need. Remains very comfortable with CPAP used every night.  She often falls asleep in a recliner, recording only 4 hours in bed with her CPAP. Download 4 hours compliance AHI 3/hour.  Feels well rested during the day. CXR 09/08/2016 Left lower lobe pneumonia. Followup PA and lateral chest X-ray is recommended in 3-4 weeks following trial of antibiotic therapy to ensure resolution.  ROS-see HPI     + = positive Constitutional:   No-   weight loss, night sweats, fevers, chills, +fatigue, lassitude. HEENT:   No-  headaches, difficulty swallowing, tooth/dental problems, sore throat,       itching, no-ear ache, +nasal congestion, +post nasal drip,  CV:  No-chest pain/ pressure, no-orthopnea, PND, swelling in lower extremities, anasarca,                                               dizziness, palpitations Resp: +  shortness of breath with exertion or at rest.              No-   productive cough,  No non-productive cough,  No- coughing up of blood.              No-   change in color of mucus.  No- wheezing.   Skin: No-   rash or lesions. GI:  No-  heartburn, indigestion, abdominal pain, nausea, vomiting, GU:  MS:  No-   joint pain or swelling. + back pain Neuro-     nothing unusual Psych:  No- change in mood or affect. No depression + anxiety.  No memory loss.  OBJ- Physical Exam   General- Alert, Oriented, Affect- calm, + overweightj Skin- clear Lymphadenopathy- none Head- atraumatic            Eyes- Gross vision intact, PERRLA,  conjunctivae and secretions clear            Ears- Hearing, canals-normal            Nose- Clear, no-Septal dev, mucus, polyps, erosion, perforation             Throat- Mallampati III , mucosa clear , drainage- none, tonsils- atrophic Neck- flexible , trachea midline, no stridor , thyroid nl, carotid no bruit Chest - symmetrical excursion , unlabored           Heart/CV- RRR , no murmur , no gallop  , no rub, nl s1 s2                           - JVD- none , edema- none, stasis changes- none, varices- none           Lung- clear to P&A, wheeze- none, cough- none , dullness-none, rub- none.           Chest wall-  Abd-  Br/ Gen/ Rectal- Not done, not indicated Extrem- cyanosis- none, clubbing, none, atrophy- none, strength- nl Neuro- grossly intact to observation

## 2017-12-12 ENCOUNTER — Other Ambulatory Visit: Payer: Self-pay | Admitting: Obstetrics and Gynecology

## 2017-12-13 NOTE — Telephone Encounter (Signed)
Medication refill request: Vivelle Patch  Last AEX:  04-03-16 Next AEX: 12-14-17  Last MMG (if hormonal medication request): 04-19-17  Refill authorized: please advise

## 2017-12-14 ENCOUNTER — Ambulatory Visit (INDEPENDENT_AMBULATORY_CARE_PROVIDER_SITE_OTHER): Payer: BC Managed Care – PPO | Admitting: Obstetrics and Gynecology

## 2017-12-14 ENCOUNTER — Encounter: Payer: Self-pay | Admitting: Obstetrics and Gynecology

## 2017-12-14 ENCOUNTER — Other Ambulatory Visit: Payer: Self-pay

## 2017-12-14 VITALS — BP 128/80 | HR 84 | Resp 16 | Ht 62.5 in | Wt 178.0 lb

## 2017-12-14 DIAGNOSIS — Z1211 Encounter for screening for malignant neoplasm of colon: Secondary | ICD-10-CM | POA: Diagnosis not present

## 2017-12-14 DIAGNOSIS — Z01419 Encounter for gynecological examination (general) (routine) without abnormal findings: Secondary | ICD-10-CM

## 2017-12-14 MED ORDER — ESTRADIOL 0.05 MG/24HR TD PTTW: 1.0000 | MEDICATED_PATCH | TRANSDERMAL | 12 refills | Status: DC

## 2017-12-14 NOTE — Progress Notes (Signed)
47 y.o. G0P0000 Single Caucasian female here for annual exam.    Wants refill of estrogen.  Trying to wean off medications in general.  Has seen dermatology and vascular specialist about her lower extremity swelling.  FH of breast and ovarian cancer.   Caring for her father who has Alzheimer's and an aunt with health issues.  Putting together a plan of care.   Labs with PCP.   PCP:  Vicenta Aly, NP  Patient's last menstrual period was 05/22/2014 (exact date).           Sexually active: No.  The current method of family planning is status post hysterectomy.    Exercising: Yes.    walking Smoker:  no  Health Maintenance: Pap:  03/16/14, Negative with neg HR HPV History of abnormal Pap:  no MMG:  04/19/17 BIRADS 1 negative/density b TDaP:  09/08/12 Gardasil:   no HIV: never Hep C: never Screening Labs:  PCP Colonoscopy in 2013 or 2014 - normal.  Had it done when she had GB problems.    reports that she has never smoked. She has never used smokeless tobacco. She reports that she does not drink alcohol or use drugs.  Past Medical History:  Diagnosis Date  . Allergy   . Anxiety    no meds  . Asthma   . Bronchitis    one or two times/year  . Edema    LLE  . Elevated liver function tests   . Fibromyalgia   . Headache(784.0)    otc med prn  . Hx of cardiovascular stress test 11/22/12   Lex MV 2/14: EF 81%, no ischemia  . Hyperlipidemia    diet controlled - no med  . Hypothyroidism   . Pneumonia    hx  . PONV (postoperative nausea and vomiting)   . Scoliosis    history  . Sleep apnea    Does not use CPAP every night  . Vasovagal syncope    one episode only    Past Surgical History:  Procedure Laterality Date  . APPENDECTOMY    . CYSTOSCOPY  05/22/2014   Procedure: CYSTOSCOPY;  Surgeon: Jamey Reas de Berton Lan, MD;  Location: [facility];  Service: Gynecology;;  . MOUTH SURGERY     wisdom teeth ext  . NASAL SINUS SURGERY     x 3  . ROBOTIC  ASSISTED LAPAROSCOPIC LYSIS OF ADHESION Bilateral 05/22/2014   Procedure: XI ROBOTIC ASSISTED LAPAROSCOPIC LYSIS OF extensive ADHESION;  Surgeon: Jamey Reas de Berton Lan, MD;  Location: [facility];  Service: Gynecology;  Laterality: Bilateral;  . ROBOTIC ASSISTED TOTAL HYSTERECTOMY WITH BILATERAL SALPINGO OOPHERECTOMY N/A 05/22/2014   Procedure: ROBOTIC ASSISTED TOTAL HYSTERECTOMY WITH BILATERAL SALPINGO OOPHORECTOMY and cystoscopy;  Surgeon: Jamey Reas de Berton Lan, MD;  Location: [facility];  Service: Gynecology;  Laterality: N/A;    Current Outpatient Medications  Medication Sig Dispense Refill  . albuterol (PROVENTIL HFA;VENTOLIN HFA) 108 (90 Base) MCG/ACT inhaler Inhale 2 puffs into the lungs every 4 (four) hours as needed for wheezing or shortness of breath. 1 Inhaler prn  . calcium carbonate (OS-CAL - DOSED IN MG OF ELEMENTAL CALCIUM) 1250 MG tablet Take 1 tablet by mouth daily.    . DENTA 5000 PLUS 1.1 % CREA dental cream Place 1 application onto teeth at bedtime as needed (dental care).     Marland Kitchen estradiol (VIVELLE-DOT) 0.1 MG/24HR patch PLACE 1 PATCH (0.1 MG TOTAL) ONTO THE SKIN 2 (TWO)  TIMES A WEEK. 8 patch 0  . fluticasone (FLONASE) 50 MCG/ACT nasal spray Place 1 spray into both nostrils daily as needed for allergies.     Marland Kitchen ipratropium-albuterol (DUONEB) 0.5-2.5 (3) MG/3ML SOLN Take 3 mLs by nebulization every 6 (six) hours as needed (asthma).    Marland Kitchen levothyroxine (SYNTHROID, LEVOTHROID) 50 MCG tablet Take 50 mcg by mouth daily before breakfast.     . mirabegron ER (MYRBETRIQ) 50 MG TB24 tablet Take 50 mg by mouth daily.    . Multiple Vitamin (MULTIVITAMIN WITH MINERALS) TABS Take 1 tablet by mouth every morning.     . Nebulizers (COMPRESSOR/NEBULIZER) MISC Use as directed for asthma 1 each prn  . Omega-3 Fatty Acids (FISH OIL PO) Take 2,400 mg by mouth daily.    Marland Kitchen OVER THE COUNTER MEDICATION Place 1 application onto the skin as needed. Essential oil therapy Rosemary,  Eucaliptus, Stress Away    . Spacer/Aero-Holding Chambers ([place]) MISC     . vitamin C (ASCORBIC ACID) 500 MG tablet Take 1,000 mg by mouth every morning.      No current facility-administered medications for this visit.     Family History  Problem Relation Age of Onset  . CAD Maternal Grandmother   . Ovarian cancer Mother 52       ovarian  . Heart disease Maternal Grandfather   . Heart disease Paternal Grandfather   . Heart disease Paternal Grandmother   . Heart disease Father   . Breast cancer Maternal Aunt   . Emphysema Maternal Uncle     ROS:  Pertinent items are noted in HPI.  Otherwise, a comprehensive ROS was negative.  Exam:   BP 128/80 (BP Location: Right Arm, Patient Position: Sitting, Cuff Size: Normal)   Pulse 84   Resp 16   Ht 5' 2.5" (1.588 m)   Wt 178 lb (80.7 kg)   LMP 05/22/2014 (Exact Date)   BMI 32.04 kg/m     General appearance: alert, cooperative and appears stated age Head: Normocephalic, without obvious abnormality, atraumatic Neck: no adenopathy, supple, symmetrical, trachea midline and thyroid normal to inspection and palpation Lungs: clear to auscultation bilaterally Breasts: normal appearance, no masses or tenderness, No nipple retraction or dimpling, No nipple discharge or bleeding, No axillary or supraclavicular adenopathy Heart: regular rate and rhythm Abdomen: soft, non-tender; no masses, no organomegaly Extremities:  Brawny edema of LLE.  RLE with edema as well.  Skin: Skin color, texture, turgor normal. No rashes or lesions Lymph nodes: Cervical, supraclavicular, and axillary nodes normal. No abnormal inguinal nodes palpated Neurologic: Grossly normal  Pelvic: External genitalia:  no lesions              Urethra:  normal appearing urethra with no masses, tenderness or lesions              Bartholins and Skenes: normal                 Vagina: normal appearing vagina with normal color and discharge, no lesions               Cervix:  Absent.              Pap taken: No. Bimanual Exam:  Uterus:  Absent.              Adnexa: no mass, fullness, tenderness              Rectal exam: Yes.  .  Confirms.  Anus:  normal sphincter tone, no lesions  Chaperone was present for exam. Assessment:   Well woman visit with normal exam. Status post robotic hysterectomy with BSO and LOA.  On ERT.  FH ovarian cancer and breast cancer.  Mother had negative genetic testing. Brawny edema of LLE.  Urinary frequency.  On Myrbetriq.  Plan: Mammogram screening discussed. Recommended self breast awareness. Pap and HR HPV as above. Guidelines for Calcium, Vitamin D, regular exercise program including cardiovascular and weight bearing exercise. Reduce transdermal estrogen to Vivelle Dot 0.05 mg twice weekly.  We talked about risks of stroke, DVT, PE, and possible breast cancer.  IFOB through [facility]. Kit to patient and order placed. Follow up annually and prn.       After visit summary provided.

## 2017-12-14 NOTE — Patient Instructions (Signed)

## 2018-01-18 ENCOUNTER — Telehealth: Payer: Self-pay | Admitting: Obstetrics and Gynecology

## 2018-01-18 NOTE — Telephone Encounter (Signed)
Spoke with patient and reminded her to complete IFOB card and return to our office in the next week if possible. Patient states she will try to do so.

## 2018-01-18 NOTE — Telephone Encounter (Signed)
Please contact patient regarding her IFOB which has not been received to date.  If not returned to the office within one week, order will be cancelled.

## 2018-04-20 ENCOUNTER — Other Ambulatory Visit: Payer: Self-pay | Admitting: Obstetrics and Gynecology

## 2018-04-20 DIAGNOSIS — Z1231 Encounter for screening mammogram for malignant neoplasm of breast: Secondary | ICD-10-CM

## 2018-04-25 ENCOUNTER — Telehealth: Payer: Self-pay | Admitting: Obstetrics and Gynecology

## 2018-04-25 NOTE — Telephone Encounter (Signed)
Patient called requesting to speak with the nurse about a 3-D mammogram she has scheduled at the [facility] on 04/28/18. She said her insurance does not cover the 3-D mammogram and she wants to know if that specific test is what's recommended by her doctor.

## 2018-04-25 NOTE — Telephone Encounter (Signed)
Call to patient. RN advised our office does recommend 3D MMG as the 3D provides better imaging, but ultimately it is patient's decision if she chooses to pay the 3D charge. Patient states she has had several friends with breast cancer and an aunt who had breast cancer in the 8's. States the 3D charge for her is $130, so she was just trying to weigh her options before deciding. Patient appreciative of phone call and update of recommendations.   Routing to provider for final review. Patient agreeable to disposition. Will close encounter.

## 2018-04-28 ENCOUNTER — Ambulatory Visit
Admission: RE | Admit: 2018-04-28 | Discharge: 2018-04-28 | Disposition: A | Discharge status: Home / Self Care | Payer: BC Managed Care – PPO | Source: Ambulatory Visit | Attending: Obstetrics and Gynecology | Admitting: Obstetrics and Gynecology

## 2018-04-28 DIAGNOSIS — Z1231 Encounter for screening mammogram for malignant neoplasm of breast: Secondary | ICD-10-CM

## 2018-08-18 ENCOUNTER — Encounter

## 2018-09-27 ENCOUNTER — Other Ambulatory Visit: Payer: Self-pay | Admitting: Obstetrics and Gynecology

## 2018-09-27 NOTE — Telephone Encounter (Signed)
Medication refill request: Estradiol  Last AEX:  12/14/17 BS Next AEX: 12/21/18 Last MMG (if hormonal medication request): 04/28/18 BIRADS 1 negative/density c  Refill authorized: 12/16/17 #8 w/12; today please advise, the pharmacy is requesting a 90-day supply. Order has been adjusted with a quantity to last patient until her AEX.

## 2018-12-07 ENCOUNTER — Telehealth: Payer: Self-pay | Admitting: Obstetrics and Gynecology

## 2018-12-07 NOTE — Telephone Encounter (Signed)
Return call to patient.  Annual exam was canceled due to Covid 19. Patient interested in discontinuing HRT. Currently on Vivelle Dot 0.05. Decreased from 0.1 mg last year and has done fine.  Discussed option of lower dose patch. Patient prefers to omit this step to save cost. She states she sometimes forgets patch and is fine. Discussed that once HRT discontinued, she would need to call if experiences side effects she cant tolerate.   Advised Dr Quincy Simmonds will review and we will call back with recommendation.

## 2018-12-07 NOTE — Telephone Encounter (Signed)
Patient requested a call back from the nurse to talk about stopping estradiol.

## 2018-12-08 ENCOUNTER — Ambulatory Visit: Payer: BC Managed Care – PPO | Admitting: Internal Medicine

## 2018-12-08 NOTE — Telephone Encounter (Signed)
I agree with just stopping her ERT.  I agree with your recommendations.   Ok to close encounter.

## 2018-12-08 NOTE — Telephone Encounter (Signed)
Patient notified. Encounter closed

## 2018-12-08 NOTE — Telephone Encounter (Signed)
Call to patient. Left message (with female that answered phone) to call back to doctor's office.

## 2018-12-21 ENCOUNTER — Ambulatory Visit: Payer: BC Managed Care – PPO | Admitting: Obstetrics and Gynecology

## 2018-12-27 ENCOUNTER — Other Ambulatory Visit: Payer: Self-pay

## 2018-12-27 MED ORDER — ESTRADIOL 0.05 MG/24HR TD PTTW: 1.0000 | MEDICATED_PATCH | TRANSDERMAL | 0 refills | Status: DC

## 2018-12-27 NOTE — Telephone Encounter (Signed)
Medication refill request: Estradiol Last AEX:  12/14/17 BS Next AEX: none scheduled  Last MMG (if hormonal medication request): 04/28/18 BIRADS 1 negative/density c Refill authorized: Please advise if appropriate; order pended #24 w/0 refills if authorized

## 2019-05-29 ENCOUNTER — Other Ambulatory Visit: Payer: Self-pay | Admitting: Obstetrics and Gynecology

## 2019-05-29 DIAGNOSIS — Z1231 Encounter for screening mammogram for malignant neoplasm of breast: Secondary | ICD-10-CM

## 2019-09-03 LAB — PULMONARY FUNCTION TEST
DL/VA % pred: 116 %
DL/VA: 5.36 ml/min/mmHg/L
DLCO unc % pred: 62 %
DLCO unc: 13.92 ml/min/mmHg
FEF 25-75 Post: 3.15 L/sec
FEF 25-75 Pre: 3.04 L/sec
FEF2575-%Change-Post: 3 %
FEF2575-%Pred-Post: 106 %
FEF2575-%Pred-Pre: 102 %
FEV1-%Change-Post: 0 %
FEV1-%Pred-Post: 68 %
FEV1-%Pred-Pre: 68 %
FEV1-Post: 1.95 L
FEV1-Pre: 1.93 L
FEV1FVC-%Change-Post: -2 %
FEV1FVC-%Pred-Pre: 110 %
FEV6-%Change-Post: 2 %
FEV6-%Pred-Post: 64 %
FEV6-%Pred-Pre: 62 %
FEV6-Post: 2.19 L
FEV6-Pre: 2.13 L
FEV6FVC-%Pred-Post: 102 %
FEV6FVC-%Pred-Pre: 102 %
FVC-%Change-Post: 2 %
FVC-%Pred-Post: 62 %
FVC-%Pred-Pre: 60 %
FVC-Post: 2.19 L
Post FEV1/FVC ratio: 89 %
Post FEV6/FVC ratio: 100 %
Pre FEV1/FVC ratio: 91 %
Pre FEV6/FVC Ratio: 100 %
RV % pred: 98 %
RV: 1.54 L
TLC % pred: 49 %
TLC: 2.37 L

## 2019-09-11 ENCOUNTER — Other Ambulatory Visit: Payer: Self-pay

## 2019-09-11 ENCOUNTER — Ambulatory Visit
Admission: RE | Admit: 2019-09-11 | Discharge: 2019-09-11 | Disposition: A | Discharge status: Home / Self Care | Payer: BC Managed Care – PPO | Source: Ambulatory Visit | Attending: Obstetrics and Gynecology | Admitting: Obstetrics and Gynecology

## 2019-09-11 DIAGNOSIS — Z1231 Encounter for screening mammogram for malignant neoplasm of breast: Secondary | ICD-10-CM

## 2019-10-23 ENCOUNTER — Ambulatory Visit: Payer: BC Managed Care – PPO | Admitting: Internal Medicine

## 2019-10-23 ENCOUNTER — Encounter: Payer: Self-pay | Admitting: Internal Medicine

## 2019-10-23 ENCOUNTER — Other Ambulatory Visit: Payer: Self-pay

## 2019-10-23 VITALS — BP 122/68 | HR 87 | Temp 97.1°F | Ht 62.5 in | Wt 176.6 lb

## 2019-10-23 DIAGNOSIS — J4520 Mild intermittent asthma, uncomplicated: Secondary | ICD-10-CM

## 2019-10-23 DIAGNOSIS — J3089 Other allergic rhinitis: Secondary | ICD-10-CM

## 2019-10-23 DIAGNOSIS — G4733 Obstructive sleep apnea (adult) (pediatric): Secondary | ICD-10-CM

## 2019-10-23 DIAGNOSIS — J302 Other seasonal allergic rhinitis: Secondary | ICD-10-CM | POA: Diagnosis not present

## 2019-10-23 MED ORDER — BREO ELLIPTA 200-25 MCG/INH IN AEPB: 1.0000 | INHALATION_SPRAY | Freq: Every day | RESPIRATORY_TRACT | 0 refills | Status: DC

## 2019-10-23 MED ORDER — BREO ELLIPTA 100-25 MCG/INH IN AEPB: INHALATION_SPRAY | RESPIRATORY_TRACT | 12 refills | Status: DC

## 2019-10-23 NOTE — Patient Instructions (Signed)
Order- DME [facility]- please change CPAP to autopap 5-15, continue mask of choice, humidifier, supplies, AirView/ card  Sample x 2 and printed script for Breo 100 maintenance inhaler  Inhale 1 puff then rinse mouth, once daily  Please call if we can help

## 2019-10-23 NOTE — Progress Notes (Signed)
HPI Allergy skin test 03/07/14- significant positive intradermal tests female never smoker followed for Asthma, episodic dyspnea, restrictive PFT, hyperventilation, OSA, anxiety, history abnormal CT Office spirometry 10/27/12- FVC2.32/ 71%, FEV1 2.02/ 73%, FEV1/FVC 0.87, FEF 25-75% 94% NPSG- 12/15/10- Dr [facility]- RDI 6.8/ hr. CPAP 10,, [facility] DME, full face mask, humidifier ECHO [id number]- Gr 2 diastolic dysfunction/CHF, no PHTN PFT 02/03/13- severe restriction of total lung capacity, normal spirometry flows for volume with insignificant response to bronchodilator, diffusion moderately reduced. TLC 49%, DLCO 62%. FVC 2.19/62%, FEV1 1.95/68%, FEV1/FVC 89/108%, FEF 25-75% 88/104%. 6 minute walk test 02/03/2013-98%, 99%, 99%, 303 m. Methacholine inhalation challenge test 01/27/2013 positive for hyperreactive airways at stage I.0 female never smoker followed for Asthma, episodic dyspnea, restrictive PFT, hyperventilation, OSA, anxiety, history abnormal CT ABG 10/12/12 (? FiO2), pH 7.609, PCO2 20.3, PO2 122, HCO3 20.5- consistent w/ acute respiratory alkalosis. -----------------------------------------------------.   12/06/2017- 49 year old female never smoker followed for Asthma, episodic dyspnea, restrictive PFT, hyperventilation, OSA, anxiety, history abnormal CT, HBP CPAP 10/ [facility]>> today  Replace old machine, auto 5-15 ----OSA: DME: [facility]. Pt wears CPAP nightly.   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  Denies new health problems. Asked refill pro-air/albuterol to keep available.  Rare need. Remains very comfortable with CPAP used every night.  She often falls asleep in a recliner, recording only 4 hours in bed with her CPAP. Download 4 hours compliance AHI 3/hour.  Feels well rested during the day. CXR 09/08/2016 Left lower lobe pneumonia. Followup PA and lateral chest X-ray is recommended in 3-4 weeks following trial of  antibiotic therapy to ensure resolution.  10/23/19- 49 year old female never smoker followed for Asthma, episodic dyspnea, restrictive PFT, hyperventilation, OSA, anxiety, history abnormal CT, HBP, DM2, -----f/u OSA. Breathing has been ok with  Asthma  Body weight today 176 lbs CPAP 10/ [facility] Download- Albuterol hfa, flonase, neb Duoneb,  Uses rescue inhaler some in cold weather.Feels controlled. Breathing doesn't wake her. 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 Had flu vax and pending covid vax.  Using CPAP more but often sleeps in recliner.  Recently dx'd DM2, not needing insulin.  ROS-see HPI     + = positive Constitutional:   No-   weight loss, night sweats, fevers, chills, +fatigue, lassitude. HEENT:   No-  headaches, difficulty swallowing, tooth/dental problems, sore throat,       itching, no-ear ache, nasal congestion, +post nasal drip,  CV:  No-chest pain/ pressure, no-orthopnea, PND, swelling in lower extremities, anasarca,                                               dizziness, palpitations Resp: +  shortness of breath with exertion or at rest.              No-   productive cough,  No non-productive cough,  No- coughing up of blood.              No-   change in color of mucus.  No- wheezing.   Skin: No-   rash or lesions. GI:  No-   heartburn, indigestion, abdominal pain, nausea, vomiting, GU:  MS:  No-   joint pain or swelling. + back pain Neuro-     nothing unusual Psych:  No- change  in mood or affect. No depression + anxiety.  No memory loss.  OBJ- Physical Exam   General- Alert, Oriented, Affect- calm, + overweightj Skin- clear Lymphadenopathy- none Head- atraumatic            Eyes- Gross vision intact, PERRLA, conjunctivae and secretions clear            Ears- Hearing, canals-normal            Nose- Clear, no-Septal dev, mucus, polyps, erosion, perforation             Throat- Mallampati III , mucosa clear ,  drainage- none, tonsils- atrophic Neck- flexible , trachea midline, no stridor , thyroid nl, carotid no bruit Chest - symmetrical excursion , unlabored           Heart/CV- RRR , no murmur , no gallop  , no rub, nl s1 s2                           - JVD- none , edema- none, stasis changes- none, varices- none           Lung- clear to P&A, wheeze- none, cough- none , dullness-none, rub- none.           Chest wall-  Abd-  Br/ Gen/ Rectal- Not done, not indicated Extrem- cyanosis- none, clubbing, none, atrophy- none, strength- nl Neuro- grossly intact to observation

## 2019-10-29 NOTE — Assessment & Plan Note (Signed)
Very mild OSA at original testing. Trying to meet compliance goals. CPAP does help snoring.  Plan - continue CPAP 10

## 2019-10-29 NOTE — Assessment & Plan Note (Signed)
Watching for impact of spring seasonal pollens starting in about a month.

## 2019-10-29 NOTE — Assessment & Plan Note (Signed)
Mild, intermittent, uncomplicated. Plan- rescue inhaler and neb have been sufficient.

## 2019-12-06 ENCOUNTER — Ambulatory Visit: Payer: BC Managed Care – PPO | Admitting: Obstetrics and Gynecology

## 2020-04-09 ENCOUNTER — Ambulatory Visit: Payer: BC Managed Care – PPO | Admitting: Obstetrics and Gynecology

## 2020-04-17 ENCOUNTER — Ambulatory Visit: Payer: BC Managed Care – PPO | Admitting: Obstetrics and Gynecology

## 2020-09-10 ENCOUNTER — Telehealth: Payer: Self-pay | Admitting: Internal Medicine

## 2020-09-10 NOTE — Telephone Encounter (Signed)
ATC x1, left detailed message per [facility] regarding download and need to bring in SD card prior to appointment to get download for CPAP.  Advised to call office with any questions.

## 2020-09-12 NOTE — Telephone Encounter (Signed)
Called and spoke to pt. Pt states she received [place] message. Pt questioned if she could go to an [employer] store to get a DL. I advised pt to call [employer] first but she should be able to get a DL from [employer]. Pt verbalized understanding and denied any further questions or concerns. 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

## 2020-10-25 NOTE — Progress Notes (Addendum)
HPI Allergy skin test 03/07/14- significant positive intradermal tests female never smoker followed for Asthma, episodic dyspnea, restrictive PFT, hyperventilation, OSA, anxiety, history abnormal CT Office spirometry 10/27/12- FVC2.32/ 71%, FEV1 2.02/ 73%, FEV1/FVC 0.87, FEF 25-75% 94% NPSG- 12/15/10- Dr [facility]- RDI 6.8/ hr. CPAP 10,, [facility] DME, full face mask, humidifier ECHO 1/61/09- Gr 2 diastolic dysfunction/CHF, no PHTN PFT 02/03/13- severe restriction of total lung capacity, normal spirometry flows for volume with insignificant response to bronchodilator, diffusion moderately reduced. TLC 49%, DLCO 62%. FVC 2.19/62%, FEV1 1.95/68%, FEV1/FVC 89/108%, FEF 25-75% 88/104%. 6 minute walk test 02/03/2013-98%, 99%, 99%, 303 m. Methacholine inhalation challenge test 01/27/2013 positive for hyperreactive airways at stage I.0 female never smoker followed for Asthma, episodic dyspnea, restrictive PFT, hyperventilation, OSA, anxiety, history abnormal CT ABG 10/12/12 (? FiO2), pH 7.609, PCO2 20.3, PO2 122, HCO3 20.5- consistent w/ acute respiratory alkalosis. -----------------------------------------------------.   10/23/19- 50 year old female never smoker followed for Asthma, episodic dyspnea, restrictive PFT, hyperventilation, OSA, anxiety, history abnormal CT, HBP, DM2, -----f/u OSA. Breathing has been ok with  Asthma  Body weight today 176 lbs CPAP 10/ [facility] Download- Albuterol hfa, flonase, neb Duoneb,  Uses rescue inhaler some in cold weather.Feels controlled. Breathing doesn't wake her. 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 Had flu vax and pending covid vax.  Using CPAP more but often sleeps in recliner.  Recently dx'd DM2, not needing insulin.  10/28/20-  50 year old female never smoker followed for Asthma, episodic dyspnea, restrictive PFT, hyperventilation, OSA, anxiety, history abnormal CT, HBP, DM2 Neb Duoneb,  Ventolin hfa, Breo 100,  CPAP 10/ [facility] >> replace old machine auto 5-15 Download- Body weight today-164 lbs Covid vax-3 Phizer Flu vax-had ED in October for L leg pain and swelling, rx'd as cellulitis after neg Doppler. -----Patient states that about a month ago she broke out in a rash and is not sure why other than she bought a new mattress. Did not affect her breathing or asthma Rash resolved, nonspecific. If it recurs she can go to allergist. Asthma has been well controlled w/o exacerbation or need for rescue inhaler recently She is back driving school bus full time.  Has recognized benefit from CPAP with improved sleep and alertness. Stopped using CPAP when leaking mask attachment began to whistle. DOT wants to see documentation she is using it, so needs replacement. Machine is old. Considering changing DME from [place] since they moved to [place].  ROS-see HPI     + = positive Constitutional:   No-   weight loss, night sweats, fevers, chills, +fatigue, lassitude. HEENT:   No-  headaches, difficulty swallowing, tooth/dental problems, sore throat,       itching, no-ear ache, nasal congestion, +post nasal drip,  CV:  No-chest pain/ pressure, no-orthopnea, PND, swelling in lower extremities, anasarca,                                               dizziness, palpitations Resp: +  shortness of breath with exertion or at rest.              No-   productive cough,  No non-productive cough,  No- coughing up of blood.              No-   change in color of mucus.  No- wheezing.  Skin: No-   rash or lesions. GI:  No-   heartburn, indigestion, abdominal pain, nausea, vomiting, GU:  MS:  No-   joint pain or swelling. + back pain Neuro-     nothing unusual Psych:  No- change in mood or affect. No depression + anxiety.  No memory loss.  OBJ- Physical Exam   General- Alert, Oriented, Affect- calm, + overweightj Skin- clear Lymphadenopathy- none Head- atraumatic            Eyes- Gross vision  intact, PERRLA, conjunctivae and secretions clear            Ears- Hearing, canals-normal            Nose- Clear, no-Septal dev, mucus, polyps, erosion, perforation             Throat- Mallampati III , mucosa clear , drainage- none, tonsils- atrophic Neck- flexible , trachea midline, no stridor , thyroid nl, carotid no bruit Chest - symmetrical excursion , unlabored           Heart/CV- RRR , no murmur , no gallop  , no rub, nl s1 s2                           - JVD- none , edema- none, stasis changes- none, varices- none           Lung- clear to P&A, wheeze- none, cough- none , dullness-none, rub- none.           Chest wall-  Abd-  Br/ Gen/ Rectal- Not done, not indicated Extrem- cyanosis- none, clubbing, none, atrophy- none, strength- nl Neuro- grossly intact to observation

## 2020-10-28 ENCOUNTER — Ambulatory Visit (INDEPENDENT_AMBULATORY_CARE_PROVIDER_SITE_OTHER): Payer: BC Managed Care – PPO | Admitting: Internal Medicine

## 2020-10-28 ENCOUNTER — Other Ambulatory Visit: Payer: Self-pay

## 2020-10-28 ENCOUNTER — Encounter: Payer: Self-pay | Admitting: Internal Medicine

## 2020-10-28 VITALS — BP 100/60 | HR 95 | Temp 97.8°F | Ht 62.0 in | Wt 164.0 lb

## 2020-10-28 DIAGNOSIS — J4520 Mild intermittent asthma, uncomplicated: Secondary | ICD-10-CM | POA: Diagnosis not present

## 2020-10-28 DIAGNOSIS — G4733 Obstructive sleep apnea (adult) (pediatric): Secondary | ICD-10-CM | POA: Diagnosis not present

## 2020-10-28 NOTE — Patient Instructions (Addendum)
Order- DME [facility] please replace old CPAP machine, change to auto 5-15, mask of choice, humidifier, supplies, AirView/ card  Order- referral to sleep center for mask fitting  Please call if we can help  If you need an allergist, I suggest the [facility] on [address]

## 2020-10-30 NOTE — Assessment & Plan Note (Addendum)
Benefits with improved sleep using CPAP. Need to replace old machine, replacing mask and changing to auto 5-15. If she changes DME from [facility], we may need to update sleep study documentation.

## 2020-10-30 NOTE — Assessment & Plan Note (Signed)
Now mild intermittent well controlled without recent need for rescue as long as she continues Breo.

## 2020-11-11 ENCOUNTER — Other Ambulatory Visit (HOSPITAL_BASED_OUTPATIENT_CLINIC_OR_DEPARTMENT_OTHER): Payer: BC Managed Care – PPO | Admitting: Internal Medicine

## 2020-11-18 ENCOUNTER — Other Ambulatory Visit (HOSPITAL_BASED_OUTPATIENT_CLINIC_OR_DEPARTMENT_OTHER): Payer: BC Managed Care – PPO | Admitting: Internal Medicine

## 2020-12-17 ENCOUNTER — Ambulatory Visit (INDEPENDENT_AMBULATORY_CARE_PROVIDER_SITE_OTHER): Payer: BC Managed Care – PPO | Admitting: Family

## 2020-12-17 ENCOUNTER — Encounter: Payer: Self-pay | Admitting: Family

## 2020-12-17 DIAGNOSIS — M25561 Pain in right knee: Secondary | ICD-10-CM | POA: Diagnosis not present

## 2020-12-18 NOTE — Progress Notes (Signed)
Office Visit Note   Patient: Destiny Sharp           Date of Birth: 11-16-1970           MRN: [id number] Visit Date: 12/17/2020              Requested by: Vicenta Aly, [address] [place],  [place] [address] PCP: Vicenta Aly, FNP  No chief complaint on file.     HPI: The patient is a 50 year old woman who presents today for initial evaluation of right knee pain.  She states that she is always had some chronic knee pain but unfortunately about 4 weeks ago she had a sudden increase in her pain.  She cannot recall any associated injury.  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  She does relate that it has made her blood sugars run all much higher and is leery of having further steroid injections  She has been using a neoprene knee sleeve without relief.  This is a second knee brace she has tried she is also been using Aleve with modest improvement.  Assessment & Plan: Visit Diagnoses:  1. Right knee pain, unspecified chronicity     Plan: We will proceed with MRI of the right knee.  Evaluate for meniscal pathology.  She will follow-up in the office for MRI review the patient is in agreement with the plan  Follow-Up Instructions: Return mri review.   Right Knee Exam   Muscle Strength  The patient has normal right knee strength.  Tenderness  The patient is experiencing tenderness in the medial joint line.  Range of Motion  The patient has normal right knee ROM. Right knee flexion: painful.   Tests  Varus: negative Valgus:  negative  Other  Erythema: absent Effusion: no effusion present      Patient is alert, oriented, no adenopathy, well-dressed, normal affect, normal respiratory effort. Limping gait  Imaging: No results found. No images are attached to the encounter.  Outside radiographs reviewed today.  Demonstrate mild degenerative changes of the right knee moderate to severe degenerative changes of the left knee.  Labs: Lab Results  Component Value Date   ESRSEDRATE 15 01/24/2013   REPTSTATUS 06/12/2014 FINAL 06/11/2014   CULT NO GROWTH Performed at [facility] 06/11/2014   LABORGA Multiple bacterial morphotypes present, none 03/16/2014   LABORGA predominant. Suggest appropriate recollection if  03/16/2014   LABORGA clinically indicated. 03/16/2014     Lab Results  Component Value Date   ALBUMIN 3.5 01/19/2016   ALBUMIN 4.4 01/21/2015   ALBUMIN 4.2 06/11/2014    No results found for: MG No results found for: VD25OH  No results found for: PREALBUMIN CBC EXTENDED Latest Ref Rng & Units 01/19/2016 01/18/2016 01/21/2015  WBC 4.0 - 10.5 K/uL 5.7 6.5 6.4  RBC 3.87 - 5.11 MIL/uL 3.72(L) 3.53(L) 4.41  HGB 12.0 - 15.0 g/dL 11.8(L) 8.1(L) 13.6  HCT 36.0 - 46.0 % 33.8(L) 27.1(L) 40.8  PLT 150 - 400 K/uL 194 284 -  NEUTROABS 1.7 - 7.7 K/uL 3.0 - -  LYMPHSABS 0.7 - 4.0 K/uL 2.1 - -     There is no height or weight on file to calculate BMI.  Orders:  Orders Placed This Encounter  Procedures  . MR Knee Right w/o contrast   No orders of the defined types were placed in this encounter.    Procedures: No procedures performed  Clinical Data: No additional findings.  ROS:  All other systems negative, except as noted in the HPI. Review of Systems  Constitutional: Negative for chills and fever.  Musculoskeletal: Positive for arthralgias and gait problem. Negative for joint swelling.    Objective: Vital Signs: LMP 05/22/2014 (Exact Date)   Specialty Comments:  No  specialty comments available.  PMFS History: Patient Active Problem List   Diagnosis Date Noted  . Seasonal and perennial allergic rhinitis 07/07/2014  . Status post laparoscopic hysterectomy 05/22/2014  . Vasovagal attack 03/07/2014  . Obstructive sleep apnea 01/17/2013  . Abnormal CT of the chest 10/30/2012  . Chest pain 10/07/2012  . Hypothyroidism   . Hypertension   . Allergic asthma, mild intermittent, uncomplicated   . Fatigue   . Edema    Past Medical History:  Diagnosis Date  . Allergy   . Anxiety    no meds  . Asthma   . Bronchitis    one or two times/year  . Edema    LLE  . Elevated liver function tests   . Fibromyalgia   . Headache(784.0)    otc med prn  . Hx of cardiovascular stress test 11/22/12   Lex MV 2/14: EF 81%, no ischemia  . Hyperlipidemia    diet controlled - no med  . Hypothyroidism   . Pneumonia    hx  . PONV (postoperative nausea and vomiting)   . Scoliosis    history  . Sleep apnea    Does not use CPAP every night  . Vasovagal syncope    one episode only    Family History  Problem Relation Age of Onset  . CAD Maternal Grandmother   . Ovarian cancer Mother 12       ovarian  . Heart disease Maternal Grandfather   . Heart disease Paternal Grandfather   . Heart disease Paternal Grandmother   . Heart disease Father   . Breast cancer Maternal Aunt   . Emphysema Maternal Uncle     Past Surgical History:  Procedure Laterality Date  . APPENDECTOMY    . CYSTOSCOPY  05/22/2014   Procedure: CYSTOSCOPY;  Surgeon: Jamey Reas de Berton Lan, MD;  Location: [facility];  Service: Gynecology;;  . MOUTH SURGERY     wisdom teeth ext  . NASAL SINUS SURGERY     x 3  . ROBOTIC ASSISTED LAPAROSCOPIC LYSIS OF ADHESION Bilateral 05/22/2014   Procedure: XI ROBOTIC ASSISTED LAPAROSCOPIC LYSIS OF extensive ADHESION;  Surgeon: Jamey Reas de Berton Lan, MD;  Location: [facility];  Service: Gynecology;  Laterality: Bilateral;  . ROBOTIC  ASSISTED TOTAL HYSTERECTOMY WITH BILATERAL SALPINGO OOPHERECTOMY N/A 05/22/2014   Procedure: ROBOTIC ASSISTED TOTAL HYSTERECTOMY WITH BILATERAL SALPINGO OOPHORECTOMY and cystoscopy;  Surgeon: Jamey Reas de Berton Lan, MD;  Location: [facility];  Service: Gynecology;  Laterality: N/A;   Social History   Occupational History    Employer: [employer]    Comment: Drive School Bus  Tobacco Use  . Smoking status: Never Smoker  . Smokeless tobacco: Never Used  Vaping Use  . Vaping Use: Never used  Substance and Sexual Activity  . Alcohol use: No    Alcohol/week: 0.0 standard drinks  . Drug use: No  . Sexual activity: Never    Partners: Male    Birth control/protection: Surgical    Comment: R-TLH/BSO

## 2021-01-01 ENCOUNTER — Telehealth: Payer: Self-pay | Admitting: Internal Medicine

## 2021-01-01 NOTE — Telephone Encounter (Signed)
I called and spoke with  Andee Poles and she stated that she has faxed over the form that will need to be signed by CY.    Pt is wanting to get a new machine since her's is over 50 years old.  Danielle wanted to see if CY would addened his note from her OV on 10/28/20 and stated how she benefits from using the machine.  Will forward to CY to see if he can do this.

## 2021-01-02 NOTE — Telephone Encounter (Signed)
I made the requested changes in the office note from 2/21 indicating benefit from CPAP. Is there a form I am supposed to sign??

## 2021-01-03 NOTE — Telephone Encounter (Signed)
Checked with Vallarie Mare to see if perhaps the form was a CMN. She stated that she has not received anything for this patient since February 2022.   Called [facility] and spoke with [place]. She stated that it was in fact a CMN form that needed to be completed. Verified the fax number. She will resend the form to our office.   Nothing further needed.

## 2021-03-20 ENCOUNTER — Encounter (HOSPITAL_COMMUNITY): Payer: Self-pay | Admitting: Student

## 2021-03-20 ENCOUNTER — Emergency Department (HOSPITAL_COMMUNITY)
Admission: EM | Admit: 2021-03-20 | Discharge: 2021-03-20 | Disposition: A | Discharge status: Home / Self Care | Payer: BC Managed Care – PPO | Attending: Emergency Medicine | Admitting: Emergency Medicine

## 2021-03-20 ENCOUNTER — Other Ambulatory Visit: Payer: Self-pay

## 2021-03-20 DIAGNOSIS — E039 Hypothyroidism, unspecified: Secondary | ICD-10-CM | POA: Insufficient documentation

## 2021-03-20 DIAGNOSIS — R531 Weakness: Secondary | ICD-10-CM | POA: Diagnosis not present

## 2021-03-20 DIAGNOSIS — R197 Diarrhea, unspecified: Secondary | ICD-10-CM | POA: Diagnosis not present

## 2021-03-20 DIAGNOSIS — J4520 Mild intermittent asthma, uncomplicated: Secondary | ICD-10-CM | POA: Diagnosis not present

## 2021-03-20 DIAGNOSIS — R112 Nausea with vomiting, unspecified: Secondary | ICD-10-CM | POA: Insufficient documentation

## 2021-03-20 DIAGNOSIS — Z7984 Long term (current) use of oral hypoglycemic drugs: Secondary | ICD-10-CM | POA: Insufficient documentation

## 2021-03-20 DIAGNOSIS — Z79899 Other long term (current) drug therapy: Secondary | ICD-10-CM | POA: Insufficient documentation

## 2021-03-20 DIAGNOSIS — R000 Tachycardia, unspecified: Secondary | ICD-10-CM | POA: Insufficient documentation

## 2021-03-20 DIAGNOSIS — Z7951 Long term (current) use of inhaled steroids: Secondary | ICD-10-CM | POA: Insufficient documentation

## 2021-03-20 DIAGNOSIS — E119 Type 2 diabetes mellitus without complications: Secondary | ICD-10-CM | POA: Diagnosis not present

## 2021-03-20 DIAGNOSIS — D696 Thrombocytopenia, unspecified: Secondary | ICD-10-CM | POA: Insufficient documentation

## 2021-03-20 LAB — COMPREHENSIVE METABOLIC PANEL
ALT: 27 U/L (ref 0–44)
AST: 27 U/L (ref 15–41)
Albumin: 3.9 g/dL (ref 3.5–5.0)
Alkaline Phosphatase: 56 U/L (ref 38–126)
Anion gap: 10 (ref 5–15)
BUN: 25 mg/dL — ABNORMAL HIGH (ref 6–20)
CO2: 25 mmol/L (ref 22–32)
Calcium: 9 mg/dL (ref 8.9–10.3)
Chloride: 104 mmol/L (ref 98–111)
Creatinine, Ser: 0.62 mg/dL (ref 0.44–1.00)
GFR, Estimated: >60 mL/min (ref >60)
Glucose, Bld: 141 mg/dL — ABNORMAL HIGH (ref 70–99)
Potassium: 3.5 mmol/L (ref 3.5–5.1)
Sodium: 139 mmol/L (ref 135–145)
Total Bilirubin: 1.7 mg/dL — ABNORMAL HIGH (ref 0.3–1.2)
Total Protein: 6.8 g/dL (ref 6.5–8.1)

## 2021-03-20 LAB — CBC WITH DIFFERENTIAL/PLATELET
Abs Immature Granulocytes: 0.02 10*3/uL (ref 0.00–0.07)
Basophils Absolute: 0 10*3/uL (ref 0.0–0.1)
Basophils Relative: 0 %
Eosinophils Absolute: 0 10*3/uL (ref 0.0–0.5)
Eosinophils Relative: 0 %
HCT: 37.5 % (ref 36.0–46.0)
Hemoglobin: 13.3 g/dL (ref 12.0–15.0)
Immature Granulocytes: 0 %
Lymphocytes Relative: 6 %
Lymphs Abs: 0.4 10*3/uL — ABNORMAL LOW (ref 0.7–4.0)
MCH: 32 pg (ref 26.0–34.0)
MCHC: 35.5 g/dL (ref 30.0–36.0)
MCV: 90.4 fL (ref 80.0–100.0)
Monocytes Absolute: 0.2 10*3/uL (ref 0.1–1.0)
Monocytes Relative: 4 %
Neutro Abs: 5.3 10*3/uL (ref 1.7–7.7)
Neutrophils Relative %: 90 %
Platelets: 141 10*3/uL — ABNORMAL LOW (ref 150–400)
RBC: 4.15 MIL/uL (ref 3.87–5.11)
RDW: 12.9 % (ref 11.5–15.5)
WBC: 5.9 10*3/uL (ref 4.0–10.5)
nRBC: 0 % (ref 0.0–0.2)

## 2021-03-20 LAB — URINALYSIS, ROUTINE W REFLEX MICROSCOPIC
Bilirubin Urine: NEGATIVE
Glucose, UA: NEGATIVE mg/dL
Hgb urine dipstick: NEGATIVE
Ketones, ur: NEGATIVE mg/dL
Leukocytes,Ua: NEGATIVE
Nitrite: NEGATIVE
Protein, ur: NEGATIVE mg/dL
Specific Gravity, Urine: 1.003 — ABNORMAL LOW (ref 1.005–1.030)
pH: 6 (ref 5.0–8.0)

## 2021-03-20 LAB — LIPASE, BLOOD: Lipase: 25 U/L (ref 11–51)

## 2021-03-20 MED ORDER — FAMOTIDINE IN NACL 20-0.9 MG/50ML-% IV SOLN
20.0000 mg | Freq: Once | INTRAVENOUS | Status: AC
  Administered 2021-03-20: 20 mg via INTRAVENOUS
  Filled 2021-03-20: qty 50

## 2021-03-20 MED ORDER — SODIUM CHLORIDE 0.9 % IV BOLUS
1000.0000 mL | Freq: Once | INTRAVENOUS | Status: AC
  Administered 2021-03-20: 1000 mL via INTRAVENOUS

## 2021-03-20 MED ORDER — FAMOTIDINE 20 MG PO TABS: 20.0000 mg | ORAL_TABLET | Freq: Two times a day (BID) | ORAL | 0 refills | Status: DC | PRN

## 2021-03-20 MED ORDER — ONDANSETRON HCL 4 MG/2ML IJ SOLN
4.0000 mg | Freq: Once | INTRAMUSCULAR | Status: AC
  Administered 2021-03-20: 4 mg via INTRAVENOUS
  Filled 2021-03-20: qty 2

## 2021-03-20 NOTE — ED Provider Notes (Signed)
[facility] DEPT Provider Note   CSN: [id number] Arrival date & time: 03/20/21  0021     History Chief Complaint  Patient presents with   Fatigue   Weakness    Destiny Sharp is a 50 y.o. female with a history of asthma, anxiety, fibromyalgia, hyperlipidemia, hypothyroidism, sleep apnea, prior appendectomy as well as total hysterectomy with salpingo-oophorectomy, and diabetes mellitus who presents to the emergency department with complaints of generalized weakness with nausea, vomiting, and diarrhea today.  Patient states she woke up at 5 AM with onset of nausea and vomiting, has subsequently vomited somewhere between 7-10 times today, she has had associated 2 episodes of diarrhea and some intermittent abdominal cramping with the vomiting.  She was seen in urgent care for this around 12:48 PM, she was given 8 mg of ODT Zofran, however she then returned home and continued to have episodes of emesis.  She has not taken the Zofran since being present in urgent care but was discharged home with a prescription for this. She has felt very weak with some subjective fever and chills. 1 episode of dysuria otherwise none.  She denies hematemesis, melena, hematochezia, constipation, vaginal bleeding, vaginal discharge, syncope, recent foreign travel, recent antibiotics, or sick contacts with similar symptoms.  No specific suspicious p.o. intake recently.  HPI     Past Medical History:  Diagnosis Date   Allergy    Anxiety    no meds   Asthma    Bronchitis    one or two times/year   Edema    LLE   Elevated liver function tests    Fibromyalgia    Headache(784.0)    otc med prn   Hx of cardiovascular stress test 11/22/12   Lex MV 2/14: EF 81%, no ischemia   Hyperlipidemia    diet controlled - no med   Hypothyroidism    Pneumonia    hx   PONV (postoperative nausea and vomiting)    Scoliosis    history   Sleep apnea    Does not use CPAP every night    Vasovagal syncope    one episode only    Patient Active Problem List   Diagnosis Date Noted   Seasonal and perennial allergic rhinitis 07/07/2014   Status post laparoscopic hysterectomy 05/22/2014   Vasovagal attack 03/07/2014   Obstructive sleep apnea 01/17/2013   Abnormal CT of the chest 10/30/2012   Chest pain 10/07/2012   Hypothyroidism    Hypertension    Allergic asthma, mild intermittent, uncomplicated    Fatigue    Edema     Past Surgical History:  Procedure Laterality Date   APPENDECTOMY     CYSTOSCOPY  05/22/2014   Procedure: CYSTOSCOPY;  Surgeon: Jamey Reas de Berton Lan, MD;  Location: [facility];  Service: Gynecology;;   MOUTH SURGERY     wisdom teeth ext   NASAL SINUS SURGERY     x 3   ROBOTIC ASSISTED LAPAROSCOPIC LYSIS OF ADHESION Bilateral 05/22/2014   Procedure: XI ROBOTIC ASSISTED LAPAROSCOPIC LYSIS OF extensive ADHESION;  Surgeon: Jamey Reas de Berton Lan, MD;  Location: [facility];  Service: Gynecology;  Laterality: Bilateral;   ROBOTIC ASSISTED TOTAL HYSTERECTOMY WITH BILATERAL SALPINGO OOPHERECTOMY N/A 05/22/2014   Procedure: ROBOTIC ASSISTED TOTAL HYSTERECTOMY WITH BILATERAL SALPINGO OOPHORECTOMY and cystoscopy;  Surgeon: Jamey Reas de Berton Lan, MD;  Location: [facility];  Service: Gynecology;  Laterality: N/A;     OB History  Gravida  0   Para  0   Term  0   Preterm  0   AB  0   Living  0      SAB  0   IAB  0   Ectopic  0   Multiple  0   Live Births  0           Family History  Problem Relation Age of Onset   CAD Maternal Grandmother    Ovarian cancer Mother 29       ovarian   Heart disease Maternal Grandfather    Heart disease Paternal Grandfather    Heart disease Paternal Grandmother    Heart disease Father    Breast cancer Maternal Aunt    Emphysema Maternal Uncle     Social History   Tobacco Use   Smoking status: Never   Smokeless tobacco: Never  Vaping Use   Vaping Use: Never  used  Substance Use Topics   Alcohol use: No    Alcohol/week: 0.0 standard drinks   Drug use: No    Home Medications Prior to Admission medications   Medication Sig Start Date End Date Taking? Authorizing Provider  Accu-Chek Softclix Lancets lancets  10/20/19   [provider]  albuterol (PROVENTIL HFA;VENTOLIN HFA) 108 (90 Base) MCG/ACT inhaler Inhale 2 puffs into the lungs every 4 (four) hours as needed for wheezing or shortness of breath. 12/06/17   Deneise Lever, MD  Blood Glucose Monitoring Suppl DEVI Use as directed once daily. Please dispense meter best covered by insurance. 10/18/19   [provider]  calcium carbonate (OS-CAL - DOSED IN MG OF ELEMENTAL CALCIUM) 1250 MG tablet Take 1 tablet by mouth daily.    [provider]  DENTA 5000 PLUS 1.1 % CREA dental cream Place 1 application onto teeth at bedtime as needed (dental care).  05/03/14   [provider]  fluticasone (FLONASE) 50 MCG/ACT nasal spray Place 1 spray into both nostrils daily as needed for allergies.  01/11/14   [provider]  fluticasone furoate-vilanterol (BREO ELLIPTA) 100-25 MCG/INH AEPB Inhale 1 puff then rinse mouth,once daily maintenance 10/23/19   Young, Tarri Fuller D, MD  Glucose Blood (BLOOD GLUCOSE TEST STRIPS) STRP Use as instructed once daily. 10/18/19   [provider]  ipratropium-albuterol (DUONEB) 0.5-2.5 (3) MG/3ML SOLN Take 3 mLs by nebulization every 6 (six) hours as needed (asthma).    [provider]  Lancets MISC Use as directed once daily 10/18/19   [provider]  levothyroxine (SYNTHROID, LEVOTHROID) 50 MCG tablet Take 50 mcg by mouth daily before breakfast.     [provider]  metFORMIN (GLUCOPHAGE) 500 MG tablet Take 1 tablet by mouth in the morning and at bedtime. 06/19/20   [provider]  mirabegron ER (MYRBETRIQ) 50 MG TB24 tablet Take 50 mg by mouth daily.    [provider]  Multiple Vitamin  (MULTIVITAMIN WITH MINERALS) TABS Take 1 tablet by mouth every morning.     [provider]  Nebulizers (COMPRESSOR/NEBULIZER) MISC Use as directed for asthma 02/03/13   Baird Lyons D, MD  Omega-3 Fatty Acids (FISH OIL PO) Take 2,400 mg by mouth daily.    [provider]  OVER THE COUNTER MEDICATION Place 1 application onto the skin as needed. Essential oil therapy Anola Gurney, Stress Away    [provider]  Spacer/Aero-Holding Josiah Lobo ([place]) [place]  01/27/13   [provider]    Allergies  Pneumococcal vaccine, Codeine, Hepatitis b vaccine recomb (3-antigen), Hepatitis b virus vaccines, Imitrex [sumatriptan], and Pneumococcal vaccines  Review of Systems   Review of Systems  Constitutional:  Positive for chills, fatigue and fever.  HENT:  Negative for congestion, ear pain and sore throat.   Respiratory:  Negative for cough and shortness of breath.   Cardiovascular:  Negative for chest pain.  Gastrointestinal:  Positive for abdominal pain, diarrhea, nausea and vomiting. Negative for anal bleeding and blood in stool.  Genitourinary:  Positive for dysuria. Negative for vaginal bleeding and vaginal discharge.  Neurological:  Positive for weakness. Negative for syncope.  All other systems reviewed and are negative.  Physical Exam Updated Vital Signs BP 120/69 (BP Location: Left Arm)   Pulse (!) 120   Temp 98.2 F (36.8 C) (Oral)   Resp 16   Ht 5\' 2"  (1.575 m)   Wt 72.6 kg   LMP 05/22/2014 (Exact Date)   SpO2 97%   BMI 29.26 kg/m   Physical Exam Vitals and nursing note reviewed.  Constitutional:      General: She is not in acute distress.    Appearance: She is well-developed. She is not toxic-appearing.  HENT:     Head: Normocephalic and atraumatic.     Mouth/Throat:     Mouth: Mucous membranes are dry.  Eyes:     General:        Right eye: No discharge.        Left eye: No discharge.     Conjunctiva/sclera:  Conjunctivae normal.  Cardiovascular:     Rate and Rhythm: Regular rhythm. Tachycardia present.  Pulmonary:     Effort: Pulmonary effort is normal. No respiratory distress.     Breath sounds: Normal breath sounds. No wheezing, rhonchi or rales.  Abdominal:     General: There is no distension.     Palpations: Abdomen is soft.     Tenderness: There is no abdominal tenderness. There is no guarding or rebound.  Musculoskeletal:     Cervical back: Neck supple.  Skin:    General: Skin is warm and dry.     Findings: No rash.  Neurological:     Mental Status: She is alert.     Comments: Clear speech.   Psychiatric:        Behavior: Behavior normal.    ED Results / Procedures / Treatments   Labs (all labs ordered are listed, but only abnormal results are displayed) Labs Reviewed  COMPREHENSIVE METABOLIC PANEL - Abnormal; Notable for the following components:      Result Value   Glucose, Bld 141 (*)    BUN 25 (*)    Total Bilirubin 1.7 (*)    All other components within normal limits  CBC WITH DIFFERENTIAL/PLATELET - Abnormal; Notable for the following components:   Platelets 141 (*)    Lymphs Abs 0.4 (*)    All other components within normal limits  URINALYSIS, ROUTINE W REFLEX MICROSCOPIC - Abnormal; Notable for the following components:   Color, Urine STRAW (*)    Specific Gravity, Urine 1.003 (*)    All other components within normal limits  LIPASE, BLOOD    EKG None  Radiology No results found.  Procedures Procedures   Medications Ordered in ED Medications  sodium chloride 0.9 % bolus 1,000 mL (0 mLs Intravenous Stopped 03/20/21 0235)  ondansetron (ZOFRAN) injection 4 mg (4 mg Intravenous Given 03/20/21 0139)  famotidine (PEPCID) IVPB 20 mg premix (0 mg Intravenous Stopped 03/20/21  0235)  sodium chloride 0.9 % bolus 1,000 mL (0 mLs Intravenous Stopped 03/20/21 0514)    ED Course  I have reviewed the triage vital signs and the nursing notes.  Pertinent labs &  imaging results that were available during my care of the patient were reviewed by me and considered in my medical decision making (see chart for details).    MDM Rules/Calculators/A&P                         Patient presents to the ED with complaints of N/V/D. Patient nontoxic appearing, vitals w/ tachycardia. On exam patient abdomen is nontender wo peritoneal signs. Will evaluate with labs, antiemetics, antacid, and fluids ordered.  Additional history obtained:  Additional history obtained from chart review & nursing note review.   Lab Tests:  I Ordered, reviewed, and interpreted labs, which included:  CBC: Mild thrombocytopenia.  No leukocytosis. CMP: Mildly elevated BUN, creatinine preserved, mild elevation in total bilirubin with normal LFTs.  No significant electrolyte derangement. Lipase: Within normal limits UA: Unremarkable.   ED Course:   05:00 RE-EVAL: Patient is feeling much better in the emergency department, she is tolerating p.o., she has received 2 L of fluids and has ambulated without difficulty. On repeat abdominal exam patient remains without peritoneal signs, low suspicion for cholecystitis, pancreatitis, diverticulitis, bowel obstruction/perforation,  or other acute surgical process.  Unclear definitive etiology, favor viral GI illness.  Will discharge home with Pepcid and recommendation to take the Zofran previously prescribed at urgent care.  Diet guidelines provided.  I discussed results, treatment plan, need for PCP follow-up, and return precautions with the patient. Provided opportunity for questions, patient confirmed understanding and is in agreement with plan.    Portions of this note were generated with L[employer]. Dictation errors may occur despite best attempts at proofreading.    Final Clinical Impression(s) / ED Diagnoses Final diagnoses:  Nausea, vomiting, and diarrhea    Rx / DC Orders ED Discharge Orders          Ordered     famotidine (PEPCID) 20 MG tablet  2 times daily PRN        03/20/21 0516             Amaryllis Dyke, PA-C 03/20/21 0519    Maudie Flakes, MD 03/20/21 [phone]

## 2021-03-20 NOTE — ED Triage Notes (Signed)
Pt reports she has been feeling weak all day, states she started throwing up around 5am yesterday morning and went to urgent care and was told she has a stomach bug.  Pt reports n/v/d as well and has not been able to keep anything down today but a few crackers and some soda.  Pt also thinks she has a uti because she has some burning when she urinates.  Pt states she has not had any of her home meds today due to n/v.

## 2021-03-20 NOTE — Discharge Instructions (Addendum)
You were seen in the emergency department tonight for vomiting and diarrhea.  Your blood work did show that your BUN which looks at kidney function and normal bilirubin are each mildly elevated and you do have some mildly low platelets, please have these rechecked by your primary care provider.  You were given fluids and IV Zofran in the emergency department.  Please take the Zofran prescribed at urgent care as directed. We are sending you home with Pepcid to take twice per day as needed for upset stomach.  We have prescribed you new medication(s) today. Discuss the medications prescribed today with your pharmacist as they can have adverse effects and interactions with your other medicines including over the counter and prescribed medications. Seek medical evaluation if you start to experience new or abnormal symptoms after taking one of these medicines, seek care immediately if you start to experience difficulty breathing, feeling of your throat closing, facial swelling, or rash as these could be indications of a more serious allergic reaction  Please follow attached diet guidelines.   Follow up with your primary care provider within 3 days for re-evaluation.  Return to the ER for new or worsening symptoms including but not limited to worsened pain, new pain, inability to keep fluids down, blood in vomit/stool, passing out, or any other concerns.

## 2021-03-20 NOTE — ED Notes (Signed)
Patient walked to the bathroom for a urine sample.

## 2021-04-01 ENCOUNTER — Other Ambulatory Visit: Payer: Self-pay | Admitting: Family Medicine

## 2021-04-01 DIAGNOSIS — Z1231 Encounter for screening mammogram for malignant neoplasm of breast: Secondary | ICD-10-CM

## 2021-06-21 ENCOUNTER — Other Ambulatory Visit: Payer: Self-pay

## 2021-06-21 ENCOUNTER — Encounter (HOSPITAL_BASED_OUTPATIENT_CLINIC_OR_DEPARTMENT_OTHER): Payer: Self-pay | Admitting: Emergency Medicine

## 2021-06-21 ENCOUNTER — Emergency Department (HOSPITAL_BASED_OUTPATIENT_CLINIC_OR_DEPARTMENT_OTHER)
Admission: EM | Admit: 2021-06-21 | Discharge: 2021-06-21 | Disposition: A | Discharge status: Home / Self Care | Payer: BC Managed Care – PPO | Attending: Emergency Medicine | Admitting: Emergency Medicine

## 2021-06-21 ENCOUNTER — Emergency Department (HOSPITAL_BASED_OUTPATIENT_CLINIC_OR_DEPARTMENT_OTHER): Payer: BC Managed Care – PPO | Admitting: Radiology

## 2021-06-21 DIAGNOSIS — J4520 Mild intermittent asthma, uncomplicated: Secondary | ICD-10-CM | POA: Diagnosis not present

## 2021-06-21 DIAGNOSIS — E039 Hypothyroidism, unspecified: Secondary | ICD-10-CM | POA: Diagnosis not present

## 2021-06-21 DIAGNOSIS — Z79899 Other long term (current) drug therapy: Secondary | ICD-10-CM | POA: Insufficient documentation

## 2021-06-21 DIAGNOSIS — I10 Essential (primary) hypertension: Secondary | ICD-10-CM | POA: Diagnosis not present

## 2021-06-21 DIAGNOSIS — R2231 Localized swelling, mass and lump, right upper limb: Secondary | ICD-10-CM | POA: Insufficient documentation

## 2021-06-21 DIAGNOSIS — Z7951 Long term (current) use of inhaled steroids: Secondary | ICD-10-CM | POA: Diagnosis not present

## 2021-06-21 DIAGNOSIS — R2230 Localized swelling, mass and lump, unspecified upper limb: Secondary | ICD-10-CM

## 2021-06-21 NOTE — Discharge Instructions (Signed)
You are seen in the ER for swelling in your hand.  Unclear why you have the swelling and related symptoms.  We would recommend that you follow-up with a hand surgeon by calling the number provided to set up an appointment in 7 to 14 days.  Until seen by orthopedist, take ibuprofen 400 mg every 6 hours for the next 3 to 5 days with food. Also ice your hand for about 15 minutes every 6 hours.  Avoid any heavy lifting with that hand. Return to the ER if you start having fevers, increased swelling.

## 2021-06-21 NOTE — ED Triage Notes (Signed)
Pt reports right hand swelling without known injury and intermittent "throbbing" pain in hand that radiates up right arm.  Visible mild swelling noted to right hand, extending up to right wrist.

## 2021-06-21 NOTE — ED Provider Notes (Signed)
[facility] DEPT Provider Note   CSN: [id number] Arrival date & time: 06/21/21  3419     History Chief Complaint  Patient presents with   hand swelling    Destiny Sharp is a 50 y.o. female.  HPI    50 year old female comes in with chief complaint of hand swelling.  Patient has no significant medical history.  She reports that she has been having throbbing type pain over her right hand dorsally for the last 2 or 3 weeks.  Over time there has been increased swelling and now the pain is radiating up her right hand on towards the elbow and shoulder.  The pain is worse with activity.  No trauma.  No history of similar symptoms.  Patient works as a Teacher, early years/pre, denies any repeated activity involving just the right hand.  No numbness over the hand.  Past Medical History:  Diagnosis Date   Allergy    Anxiety    no meds   Asthma    Bronchitis    one or two times/year   Edema    LLE   Elevated liver function tests    Fibromyalgia    Headache(784.0)    otc med prn   Hx of cardiovascular stress test 11/22/12   Lex MV 2/14: EF 81%, no ischemia   Hyperlipidemia    diet controlled - no med   Hypothyroidism    Pneumonia    hx   PONV (postoperative nausea and vomiting)    Scoliosis    history   Sleep apnea    Does not use CPAP every night   Vasovagal syncope    one episode only    Patient Active Problem List   Diagnosis Date Noted   Seasonal and perennial allergic rhinitis 07/07/2014   Status post laparoscopic hysterectomy 05/22/2014   Vasovagal attack 03/07/2014   Obstructive sleep apnea 01/17/2013   Abnormal CT of the chest 10/30/2012   Chest pain 10/07/2012   Hypothyroidism    Hypertension    Allergic asthma, mild intermittent, uncomplicated    Fatigue    Edema     Past Surgical History:  Procedure Laterality Date   APPENDECTOMY     CYSTOSCOPY  05/22/2014   Procedure: CYSTOSCOPY;  Surgeon: Jamey Reas de Berton Lan,  MD;  Location: [facility];  Service: Gynecology;;   MOUTH SURGERY     wisdom teeth ext   NASAL SINUS SURGERY     x 3   ROBOTIC ASSISTED LAPAROSCOPIC LYSIS OF ADHESION Bilateral 05/22/2014   Procedure: XI ROBOTIC ASSISTED LAPAROSCOPIC LYSIS OF extensive ADHESION;  Surgeon: Jamey Reas de Berton Lan, MD;  Location: [facility];  Service: Gynecology;  Laterality: Bilateral;   ROBOTIC ASSISTED TOTAL HYSTERECTOMY WITH BILATERAL SALPINGO OOPHERECTOMY N/A 05/22/2014   Procedure: ROBOTIC ASSISTED TOTAL HYSTERECTOMY WITH BILATERAL SALPINGO OOPHORECTOMY and cystoscopy;  Surgeon: Jamey Reas de Berton Lan, MD;  Location: [facility];  Service: Gynecology;  Laterality: N/A;     OB History     Gravida  0   Para  0   Term  0   Preterm  0   AB  0   Living  0      SAB  0   IAB  0   Ectopic  0   Multiple  0   Live Births  0           Family History  Problem Relation Age of Onset  CAD Maternal Grandmother    Ovarian cancer Mother 24       ovarian   Heart disease Maternal Grandfather    Heart disease Paternal Grandfather    Heart disease Paternal Grandmother    Heart disease Father    Breast cancer Maternal Aunt    Emphysema Maternal Uncle     Social History   Tobacco Use   Smoking status: Never   Smokeless tobacco: Never  Vaping Use   Vaping Use: Never used  Substance Use Topics   Alcohol use: No    Alcohol/week: 0.0 standard drinks   Drug use: No    Home Medications Prior to Admission medications   Medication Sig Start Date End Date Taking? Authorizing Provider  Accu-Chek Softclix Lancets lancets  10/20/19   [provider]  albuterol (PROVENTIL HFA;VENTOLIN HFA) 108 (90 Base) MCG/ACT inhaler Inhale 2 puffs into the lungs every 4 (four) hours as needed for wheezing or shortness of breath. 12/06/17   Deneise Lever, MD  Blood Glucose Monitoring Suppl DEVI Use as directed once daily. Please dispense meter best covered by insurance. 10/18/19    [provider]  calcium carbonate (OS-CAL - DOSED IN MG OF ELEMENTAL CALCIUM) 1250 MG tablet Take 1 tablet by mouth daily.    [provider]  DENTA 5000 PLUS 1.1 % CREA dental cream Place 1 application onto teeth at bedtime as needed (dental care).  05/03/14   [provider]  famotidine (PEPCID) 20 MG tablet Take 1 tablet (20 mg total) by mouth 2 (two) times daily as needed for heartburn or indigestion. 03/20/21   Petrucelli, Samantha R, PA-C  fluticasone (FLONASE) 50 MCG/ACT nasal spray Place 1 spray into both nostrils daily as needed for allergies.  01/11/14   [provider]  fluticasone furoate-vilanterol (BREO ELLIPTA) 100-25 MCG/INH AEPB Inhale 1 puff then rinse mouth,once daily maintenance 10/23/19   Young, Tarri Fuller D, MD  Glucose Blood (BLOOD GLUCOSE TEST STRIPS) STRP Use as instructed once daily. 10/18/19   [provider]  ipratropium-albuterol (DUONEB) 0.5-2.5 (3) MG/3ML SOLN Take 3 mLs by nebulization every 6 (six) hours as needed (asthma).    [provider]  Lancets MISC Use as directed once daily 10/18/19   [provider]  levothyroxine (SYNTHROID, LEVOTHROID) 50 MCG tablet Take 50 mcg by mouth daily before breakfast.     [provider]  metFORMIN (GLUCOPHAGE) 500 MG tablet Take 1 tablet by mouth in the morning and at bedtime. 06/19/20   [provider]  mirabegron ER (MYRBETRIQ) 50 MG TB24 tablet Take 50 mg by mouth daily.    [provider]  Multiple Vitamin (MULTIVITAMIN WITH MINERALS) TABS Take 1 tablet by mouth every morning.     [provider]  Nebulizers (COMPRESSOR/NEBULIZER) MISC Use as directed for asthma 02/03/13   Baird Lyons D, MD  Omega-3 Fatty Acids (FISH OIL PO) Take 2,400 mg by mouth daily.    [provider]  OVER THE COUNTER MEDICATION Place 1 application onto the skin as needed. Essential oil therapy Anola Gurney, Stress Away    [provider]  Spacer/Aero-Holding Chambers ([place]) [place]  01/27/13   [provider]    Allergies    Pneumococcal vaccine, Codeine, Hepatitis b vaccine recomb (3-antigen), Hepatitis b virus vaccines, Imitrex [sumatriptan], and Pneumococcal vaccines  Review of Systems   Review of Systems  Constitutional:  Negative for activity change.  Musculoskeletal:  Positive for arthralgias.  Allergic/Immunologic: Negative for immunocompromised  state.  Neurological:  Negative for numbness.  Hematological:  Does not bruise/bleed easily.   Physical Exam Updated Vital Signs BP 135/67   Pulse 83   Temp 97.9 F (36.6 C)   Resp 18   Ht 5\' 2"  (1.575 m)   Wt 72.6 kg   LMP 05/22/2014 (Exact Date)   SpO2 98%   BMI 29.26 kg/m   Physical Exam Vitals and nursing note reviewed.  Constitutional:      Appearance: She is well-developed.  HENT:     Head: Atraumatic.  Cardiovascular:     Rate and Rhythm: Normal rate.  Pulmonary:     Effort: Pulmonary effort is normal.  Musculoskeletal:        General: Swelling and tenderness present. No deformity.     Cervical back: Normal range of motion and neck supple.  Skin:    General: Skin is warm and dry.     Findings: No erythema.  Neurological:     Mental Status: She is alert and oriented to person, place, and time.    ED Results / Procedures / Treatments   Labs (all labs ordered are listed, but only abnormal results are displayed) Labs Reviewed - No data to display  EKG None  Radiology DG Hand Complete Right  Result Date: 06/21/2021 CLINICAL DATA:  Pt reports right hand swelling without known injury and intermittent "throbbing" pain in hand that radiates up right arm. The EXAM: RIGHT HAND - COMPLETE 3+ VIEW COMPARISON:  None. FINDINGS: There is no evidence of fracture or dislocation. There is no evidence of arthropathy or other focal bone abnormality. Soft tissues are unremarkable. IMPRESSION: Negative. Electronically Signed   By:  Audie Pinto M.D.   On: 06/21/2021 10:38    Procedures Procedures   Medications Ordered in ED Medications - No data to display  ED Course  I have reviewed the triage vital signs and the nursing notes.  Pertinent labs & imaging results that were available during my care of the patient were reviewed by me and considered in my medical decision making (see chart for details).    MDM Rules/Calculators/A&P                           50 year old comes in with chief complaint of swelling to her right hand.  Swelling has been present for the last few days with throbbing pain.  No trauma.  No signs of infection on her exam.  Patient does not have any numbness and she is able to flex and extend over the wrist, IP joints, and abduct, adduct her digits and oppose her thumb.  Clinically does not appear to be carpal tunnels or de Quervain's tenosynovitis either.  I think we will have to treat this conservatively with RICE and have her follow-up with orthopedist if not getting better.  Final Clinical Impression(s) / ED Diagnoses Final diagnoses:  Localized swelling on hand    Rx / DC Orders ED Discharge Orders     None        Varney Biles, MD 06/21/21 1148

## 2021-06-24 ENCOUNTER — Ambulatory Visit (INDEPENDENT_AMBULATORY_CARE_PROVIDER_SITE_OTHER): Payer: BC Managed Care – PPO | Admitting: Orthopedic Surgery

## 2021-06-24 ENCOUNTER — Encounter: Payer: Self-pay | Admitting: Orthopedic Surgery

## 2021-06-24 DIAGNOSIS — M79641 Pain in right hand: Secondary | ICD-10-CM | POA: Insufficient documentation

## 2021-06-24 MED ORDER — METHYLPREDNISOLONE 4 MG PO TBPK: ORAL_TABLET | ORAL | 0 refills | Status: DC

## 2021-06-24 NOTE — Progress Notes (Signed)
Office Visit Note   Patient: Destiny Sharp           Date of Birth: 1971/04/24           MRN: [id number] Visit Date: 06/24/2021              Requested by: Vicenta Aly, [place],  [place] [id number] PCP: Vicenta Aly, FNP   Assessment & Plan: Visit Diagnoses:  1. Pain in right hand     Plan: Discussed with patient that his seems most like an inflammatory condition affection her index finger at the MP joint.  This does not seem infectious as she has no real erythema, no recent illness, no recent scrapes/cuts/breaks in the skin, and no systemic symptoms. She has no history of gout. She has no evidence of osteoarthritis on XR and denies any history of trauma to this hand. We will try a medrol dosepak and see if this helps her symptoms.  If her symptoms start to worsen or do not improve the she will return to the office.   Follow-Up Instructions: No follow-ups on file.   Orders:  No orders of the defined types were placed in this encounter.  No orders of the defined types were placed in this encounter.     Procedures: No procedures performed   Clinical Data: No additional findings.   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    This is a 50 year old right-hand-dominant female who works as a Teacher, early years/pre who presents with atraumatic pain involving the right hand including the index finger MP joint.  She reports that this started 2 to 3 weeks ago.  She was seen in the ER over the weekend because she described pain that was radiating proximally up her arm.  That has resolved.  She now describes pain that mostly over the index finger MP joint.  She has  no pain with range of motion of the wrist.  She has no range of motion with her thumb, middle, ring or small finger.  She mostly has soreness when ranging the index finger.  There is no erythema.  She denies any breaks in the skin such as cuts scrapes or abrasions.  She denies any recent illnesses.  She denies any systemic symptoms.  She otherwise feels her normal self.  She has no history of inflammatory arthritis.  She has no history of gout.  She has no history of trauma to this hand.  She notes that this is hand has been giving her significant difficulty when driving particularly when using her parking break.   Review of Systems  Constitutional: Negative.   Respiratory: Negative.    Cardiovascular: Negative.   Musculoskeletal:  Positive for joint swelling.  Neurological:  Negative for numbness.    Objective: Vital Signs: BP 115/74 (BP Location: Left Arm, Patient Position: Sitting)   Pulse 87   Ht 5\' 2"  (1.575 m)   Wt 160 lb 0.9 oz (72.6 kg)   LMP 05/22/2014 (Exact Date)   BMI 29.27 kg/m   Physical Exam Constitutional:      Appearance: Normal appearance.  Cardiovascular:     Rate  and Rhythm: Normal rate.     Pulses: Normal pulses.  Pulmonary:     Effort: Pulmonary effort is normal.  Skin:    General: Skin is warm and dry.     Capillary Refill: Capillary refill takes less than 2 seconds.  Neurological:     Mental Status: She is alert.    Right Hand Exam   Tenderness  Right hand tenderness location: Mildly TTP at index finger MP joint.  Range of Motion  The patient has normal right wrist ROM.   Muscle Strength  The patient has normal right wrist strength.  Other  Erythema: absent Sensation: normal Pulse: present  Comments:  Mild swelling over index MP joint.  No erythema.  Near full ROM of hand when making a fist but somewhat limited by index MP swelling.  No TTP at dorsum of hand, other digits, or wrist.  Full and painless wrist ROM.  No pain along flexor tendon  sheath of any finger.      Specialty Comments:  No specialty comments available.  Imaging: 3 views of the right hand taken today reviewed interpreted by me.  They demonstrate no acute bony injury.  The index MP joint where she is most symptomatic has a well-maintained joint space.  There are no periarticular erosions.  There is evidence of some soft tissue swelling in the area.   PMFS History: Patient Active Problem List   Diagnosis Date Noted   Pain in right hand 06/24/2021   Seasonal and perennial allergic rhinitis 07/07/2014   Status post laparoscopic hysterectomy 05/22/2014   Vasovagal attack 03/07/2014   Obstructive sleep apnea 01/17/2013   Abnormal CT of the chest 10/30/2012   Chest pain 10/07/2012   Hypothyroidism    Hypertension    Allergic asthma, mild intermittent, uncomplicated    Fatigue    Edema    Past Medical History:  Diagnosis Date   Allergy    Anxiety    no meds   Asthma    Bronchitis    one or two times/year   Edema    LLE   Elevated liver function tests    Fibromyalgia    Headache(784.0)    otc med prn   Hx of cardiovascular stress test 11/22/12   Lex MV 2/14: EF 81%, no ischemia   Hyperlipidemia    diet controlled - no med   Hypothyroidism    Pneumonia    hx   PONV (postoperative nausea and vomiting)    Scoliosis    history   Sleep apnea    Does not use CPAP every night   Vasovagal syncope    one episode only    Family History  Problem Relation Age of Onset   CAD Maternal Grandmother    Ovarian cancer Mother 48       ovarian   Heart disease Maternal Grandfather    Heart disease Paternal Grandfather    Heart disease Paternal Grandmother    Heart disease Father    Breast cancer Maternal Aunt    Emphysema Maternal Uncle     Past Surgical History:  Procedure Laterality Date   APPENDECTOMY     CYSTOSCOPY  05/22/2014   Procedure: CYSTOSCOPY;  Surgeon: Everardo All Amundson de Berton Lan, MD;  Location: [facility];  Service:  Gynecology;;   MOUTH SURGERY     wisdom teeth ext   NASAL SINUS SURGERY     x 3   ROBOTIC ASSISTED LAPAROSCOPIC LYSIS OF ADHESION Bilateral 05/22/2014  Procedure: XI ROBOTIC ASSISTED LAPAROSCOPIC LYSIS OF extensive ADHESION;  Surgeon: Everardo All Amundson de Berton Lan, MD;  Location: [facility];  Service: Gynecology;  Laterality: Bilateral;   ROBOTIC ASSISTED TOTAL HYSTERECTOMY WITH BILATERAL SALPINGO OOPHERECTOMY N/A 05/22/2014   Procedure: ROBOTIC ASSISTED TOTAL HYSTERECTOMY WITH BILATERAL SALPINGO OOPHORECTOMY and cystoscopy;  Surgeon: Jamey Reas de Berton Lan, MD;  Location: [facility];  Service: Gynecology;  Laterality: N/A;   Social History   Occupational History    Employer: [employer]    Comment: Drive School Bus  Tobacco Use   Smoking status: Never   Smokeless tobacco: Never  Vaping Use   Vaping Use: Never used  Substance and Sexual Activity   Alcohol use: No    Alcohol/week: 0.0 standard drinks   Drug use: No   Sexual activity: Never    Partners: Male    Birth control/protection: Surgical    Comment: R-TLH/BSO

## 2021-09-07 DIAGNOSIS — M069 Rheumatoid arthritis, unspecified: Secondary | ICD-10-CM

## 2021-09-07 HISTORY — DX: Rheumatoid arthritis, unspecified: M06.9

## 2021-09-09 DIAGNOSIS — Z1231 Encounter for screening mammogram for malignant neoplasm of breast: Secondary | ICD-10-CM

## 2021-11-12 ENCOUNTER — Other Ambulatory Visit: Payer: Self-pay

## 2021-11-12 ENCOUNTER — Emergency Department (HOSPITAL_BASED_OUTPATIENT_CLINIC_OR_DEPARTMENT_OTHER): Payer: BC Managed Care – PPO

## 2021-11-12 ENCOUNTER — Encounter (HOSPITAL_BASED_OUTPATIENT_CLINIC_OR_DEPARTMENT_OTHER): Payer: Self-pay

## 2021-11-12 ENCOUNTER — Emergency Department (HOSPITAL_BASED_OUTPATIENT_CLINIC_OR_DEPARTMENT_OTHER)
Admission: EM | Admit: 2021-11-12 | Discharge: 2021-11-12 | Disposition: A | Discharge status: Home / Self Care | Payer: BC Managed Care – PPO | Attending: Emergency Medicine | Admitting: Emergency Medicine

## 2021-11-12 DIAGNOSIS — M79662 Pain in left lower leg: Secondary | ICD-10-CM | POA: Diagnosis not present

## 2021-11-12 DIAGNOSIS — E039 Hypothyroidism, unspecified: Secondary | ICD-10-CM | POA: Diagnosis not present

## 2021-11-12 DIAGNOSIS — J45909 Unspecified asthma, uncomplicated: Secondary | ICD-10-CM | POA: Insufficient documentation

## 2021-11-12 DIAGNOSIS — M79605 Pain in left leg: Secondary | ICD-10-CM

## 2021-11-12 DIAGNOSIS — R52 Pain, unspecified: Secondary | ICD-10-CM

## 2021-11-12 LAB — CBC WITH DIFFERENTIAL/PLATELET
Abs Immature Granulocytes: 0.01 10*3/uL (ref 0.00–0.07)
Basophils Absolute: 0 10*3/uL (ref 0.0–0.1)
Basophils Relative: 1 %
Eosinophils Absolute: 0.3 10*3/uL (ref 0.0–0.5)
Eosinophils Relative: 5 %
HCT: 38.1 % (ref 36.0–46.0)
Hemoglobin: 13.5 g/dL (ref 12.0–15.0)
Immature Granulocytes: 0 %
Lymphocytes Relative: 33 %
Lymphs Abs: 1.9 10*3/uL (ref 0.7–4.0)
MCH: 31.3 pg (ref 26.0–34.0)
MCHC: 35.4 g/dL (ref 30.0–36.0)
MCV: 88.2 fL (ref 80.0–100.0)
Monocytes Absolute: 0.3 10*3/uL (ref 0.1–1.0)
Monocytes Relative: 5 %
Neutro Abs: 3.3 10*3/uL (ref 1.7–7.7)
Neutrophils Relative %: 56 %
Platelets: 199 10*3/uL (ref 150–400)
RBC: 4.32 MIL/uL (ref 3.87–5.11)
RDW: 13.2 % (ref 11.5–15.5)
WBC: 5.8 10*3/uL (ref 4.0–10.5)
nRBC: 0 % (ref 0.0–0.2)

## 2021-11-12 LAB — BASIC METABOLIC PANEL
Anion gap: 11 (ref 5–15)
BUN: 20 mg/dL (ref 6–20)
CO2: 26 mmol/L (ref 22–32)
Calcium: 10.1 mg/dL (ref 8.9–10.3)
Chloride: 103 mmol/L (ref 98–111)
Creatinine, Ser: 0.64 mg/dL (ref 0.44–1.00)
GFR, Estimated: >60 mL/min (ref >60)
Glucose, Bld: 140 mg/dL — ABNORMAL HIGH (ref 70–99)
Potassium: 3.9 mmol/L (ref 3.5–5.1)
Sodium: 140 mmol/L (ref 135–145)

## 2021-11-12 MED ORDER — DOXYCYCLINE HYCLATE 100 MG PO CAPS: 100.0000 mg | ORAL_CAPSULE | Freq: Two times a day (BID) | ORAL | 0 refills | Status: AC

## 2021-11-12 MED ORDER — DOXYCYCLINE HYCLATE 100 MG PO TABS
100.0000 mg | ORAL_TABLET | Freq: Once | ORAL | Status: AC
  Administered 2021-11-12: 100 mg via ORAL
  Filled 2021-11-12: qty 1

## 2021-11-12 NOTE — ED Triage Notes (Signed)
Left leg and behind the knee pain starting app 5-6 days ago. Denies injury. ?

## 2021-11-12 NOTE — Discharge Instructions (Signed)
You were evaluated in the Emergency Department and after careful evaluation, we did not find any emergent condition requiring admission or further testing in the hospital. ? ?Your exam/testing today was overall reassuring.  Ultrasound does not show any signs of a blood clot.  Recommend taking the doxycycline antibiotic to treat for possible skin infection.  Use Tylenol or Motrin for pain.  Recommend elevating and resting the leg for the next 2 days. ? ?Please return to the Emergency Department if you experience any worsening of your condition.  Thank you for allowing [place] to be a part of your care. ? ?

## 2021-11-12 NOTE — ED Provider Notes (Signed)
?[facility] ?[facility] ?Provider Note ?MRN:  [id number]  ?Arrival date & time: 11/12/21    ? ?Chief Complaint   ?Leg Pain ?  ?History of Present Illness   ?Destiny Sharp is a 51 y.o. year-old female with a history of fibromyalgia presenting to the ED with chief complaint of leg pain. ? ?Constant left leg pain worsening over the past 5 or 6 days.  Pain is located in the left calf and behind the left knee.  Associated with some swelling and redness.  Denies chest pain or shortness of breath, no fever, no trauma.  Works as a Recruitment consultant. ? ?Review of Systems  ?A thorough review of systems was obtained and all systems are negative except as noted in the HPI and PMH.  ? ?Patient's Health History   ? ?Past Medical History:  ?Diagnosis Date  ? Allergy   ? Anxiety   ? no meds  ? Asthma   ? Bronchitis   ? one or two times/year  ? Edema   ? LLE  ? Elevated liver function tests   ? Fibromyalgia   ? Headache(784.0)   ? otc med prn  ? Hx of cardiovascular stress test 11/22/12  ? Lex MV 2/14: EF 81%, no ischemia  ? Hyperlipidemia   ? diet controlled - no med  ? Hypothyroidism   ? Pneumonia   ? hx  ? PONV (postoperative nausea and vomiting)   ? Scoliosis   ? history  ? Sleep apnea   ? Does not use CPAP every night  ? Vasovagal syncope   ? one episode only  ?  ?Past Surgical History:  ?Procedure Laterality Date  ? ABDOMINAL HYSTERECTOMY    ? APPENDECTOMY    ? CYSTOSCOPY  05/22/2014  ? Procedure: CYSTOSCOPY;  Surgeon: Jamey Reas de Berton Lan, MD;  Location: [facility];  Service: Gynecology;;  ? MOUTH SURGERY    ? wisdom teeth ext  ? NASAL SINUS SURGERY    ? x 3  ? ROBOTIC ASSISTED LAPAROSCOPIC LYSIS OF ADHESION Bilateral 05/22/2014  ? Procedure: XI ROBOTIC ASSISTED LAPAROSCOPIC LYSIS OF extensive ADHESION;  Surgeon: Everardo All Amundson de Berton Lan, MD;  Location: [facility];  Service: Gynecology;  Laterality: Bilateral;  ? ROBOTIC ASSISTED TOTAL HYSTERECTOMY WITH BILATERAL SALPINGO  OOPHERECTOMY N/A 05/22/2014  ? Procedure: ROBOTIC ASSISTED TOTAL HYSTERECTOMY WITH BILATERAL SALPINGO OOPHORECTOMY and cystoscopy;  Surgeon: Jamey Reas de Berton Lan, MD;  Location: [facility];  Service: Gynecology;  Laterality: N/A;  ?  ?Family History  ?Problem Relation Age of Onset  ? CAD Maternal Grandmother   ? Ovarian cancer Mother 27  ?     ovarian  ? Heart disease Maternal Grandfather   ? Heart disease Paternal Grandfather   ? Heart disease Paternal Grandmother   ? Heart disease Father   ? Breast cancer Maternal Aunt   ? Emphysema Maternal Uncle   ?  ?Social History  ? ?Socioeconomic History  ? Marital status: Single  ?  Spouse name: Not on file  ? Number of children: 0  ? Years of education: Not on file  ? Highest education level: Not on file  ?Occupational History  ?  Employer: [employer]  ?  Comment: [employer]  ?Tobacco Use  ? Smoking status: Never  ? Smokeless tobacco: Never  ?Vaping Use  ? Vaping Use: Never used  ?Substance and Sexual Activity  ? Alcohol use: No  ?  Alcohol/week: 0.0 standard drinks  ? Drug use: No  ? Sexual activity: Never  ?  Partners: Male  ?  Birth control/protection: Surgical  ?  Comment: R-TLH/BSO  ?Other Topics Concern  ? Not on file  ?Social History Narrative  ? Patient is a school bus driver.  ? ?Social Determinants of Health  ? ?Financial Resource Strain: Not on file  ?Food Insecurity: Not on file  ?Transportation Needs: Not on file  ?Physical Activity: Not on file  ?Stress: Not on file  ?Social Connections: Not on file  ?Intimate Partner Violence: Not on file  ?  ? ?Physical Exam  ? ?Vitals:  ? 11/12/21 2230 11/12/21 2300  ?BP: 124/71 123/73  ?Pulse: 88 83  ?Resp: 18 18  ?Temp:    ?SpO2: 96% 99%  ?  ?CONSTITUTIONAL: Well-appearing, NAD ?NEURO/PSYCH:  Alert and oriented x 3, no focal deficits ?EYES:  eyes equal and reactive ?ENT/NECK:  no LAD, no JVD ?CARDIO: Regular rate, well-perfused, normal S1 and S2 ?PULM:  CTAB no wheezing or rhonchi ?GI/GU:   non-distended, non-tender ?MSK/SPINE:  No gross deformities, no edema ?SKIN: Mild erythema, edema to the left lower extremity ? ? ?*Additional and/or pertinent findings included in MDM below ? ?Diagnostic and Interventional Summary  ? ? EKG Interpretation ? ?Date/Time:    ?Ventricular Rate:    ?PR Interval:    ?QRS Duration:   ?QT Interval:    ?QTC Calculation:   ?R Axis:     ?Text Interpretation:   ?  ? ?  ? ?Labs Reviewed  ?BASIC METABOLIC PANEL - Abnormal; Notable for the following components:  ?    Result Value  ? Glucose, Bld 140 (*)   ? All other components within normal limits  ?CBC WITH DIFFERENTIAL/PLATELET  ?  ?US Venous Img Lower Unilateral Left (DVT)  ?Final Result  ?  ?  ?Medications  ?doxycycline (VIBRA-TABS) tablet 100 mg (has no administration in time range)  ?  ? ?Procedures  /  Critical Care ?Procedures ? ?ED Course and Medical Decision Making  ?Initial Impression and Ddx ?Exam suspicious for cellulitis versus DVT.  Patient has normal range of motion of the foot, ankle, knee, no trauma, doubt septic joint.  Pain is mostly behind the knee, bursitis also a possibility.  Labs are reassuring and ultrasound is without evidence of DVT.  Patient is appropriate for discharge, will cover for cellulitis with doxycycline. ? ?Past medical/surgical history that increases complexity of ED encounter: None ? ?Interpretation of Diagnostics ?I personally reviewed the laboratory assessment and ultrasound and my interpretation is as follows: No significant blood count or electrolyte disturbance, no evidence of DVT ?   ? ? ?Patient Reassessment and Ultimate Disposition/Management ?Discharge home ? ?Patient management required discussion with the following services or consulting groups:  None ? ?Complexity of Problems Addressed ?Acute illness or injury that poses threat of life of bodily function ? ?Additional Data Reviewed and Analyzed ?Further history obtained from: ?None ? ?Additional Factors Impacting ED Encounter  Risk ?Prescriptions ? ?Barth Kirks. Sedonia Small, MD ?[facility] ?[place] [email or web address] ? ?Final Clinical Impressions(s) / ED Diagnoses  ? ?  ICD-10-CM   ?1. Pain of left lower extremity  M79.605   ?  ?2. Pain  R52 US Venous Img Lower Unilateral Left (DVT)  ?  US Venous Img Lower Unilateral Left (DVT)  ?  ?  ?ED Discharge Orders   ? ?      Ordered  ?  doxycycline (  VIBRAMYCIN) 100 MG capsule  2 times daily       ? 11/12/21 2319  ? ?  ?  ? ?  ?  ? ?Discharge Instructions Discussed with and Provided to Patient:  ? ? ?Discharge Instructions   ? ?  ?You were evaluated in the Emergency Department and after careful evaluation, we did not find any emergent condition requiring admission or further testing in the hospital. ? ?Your exam/testing today was overall reassuring.  Ultrasound does not show any signs of a blood clot.  Recommend taking the doxycycline antibiotic to treat for possible skin infection.  Use Tylenol or Motrin for pain.  Recommend elevating and resting the leg for the next 2 days. ? ?Please return to the Emergency Department if you experience any worsening of your condition.  Thank you for allowing [place] to be a part of your care. ? ? ? ? ?  ?Maudie Flakes, MD ?11/12/21 2323 ? ?

## 2021-11-12 NOTE — ED Notes (Signed)
Pt verbalizes understanding of discharge instructions. Opportunity for questioning and answers were provided. Pt discharged from ED to home.   ? ?

## 2021-12-25 ENCOUNTER — Encounter (HOSPITAL_COMMUNITY): Payer: Self-pay

## 2021-12-25 ENCOUNTER — Ambulatory Visit (HOSPITAL_COMMUNITY)
Admission: EM | Admit: 2021-12-25 | Discharge: 2021-12-25 | Disposition: A | Discharge status: Home / Self Care | Payer: Worker's Compensation | Attending: Family Medicine | Admitting: Family Medicine

## 2021-12-25 DIAGNOSIS — H9313 Tinnitus, bilateral: Secondary | ICD-10-CM

## 2021-12-25 DIAGNOSIS — H9203 Otalgia, bilateral: Secondary | ICD-10-CM

## 2021-12-25 MED ORDER — IBUPROFEN 800 MG PO TABS: 800.0000 mg | ORAL_TABLET | Freq: Three times a day (TID) | ORAL | 0 refills | Status: DC | PRN

## 2021-12-25 NOTE — ED Provider Notes (Signed)
?[place] ? ? ? ?CSN: [id number] ?Arrival date & time: 12/25/21  1136 ? ? ?  ? ?History   ?Chief Complaint ?Chief Complaint  ?Patient presents with  ? Tinnitus  ? ? ?HPI ?Destiny Sharp is a 51 y.o. female.  ? ?HPI ?Here for tinnitus and bilateral ear pain.  Her right ear feels the worst.  No fever or chills or cough. ? 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 ? ?Past Medical History:  ?Diagnosis Date  ? Allergy   ? Anxiety   ? no meds  ? Asthma   ? Bronchitis   ? one or two times/year  ? Edema   ? LLE  ? Elevated liver function tests   ? Fibromyalgia   ? Headache(784.0)   ? otc med prn  ? Hx of cardiovascular stress test 11/22/12  ? Lex MV 2/14: EF 81%, no ischemia  ? Hyperlipidemia   ? diet controlled - no med  ? Hypothyroidism   ? Pneumonia   ? hx  ? PONV (postoperative nausea and vomiting)   ? Scoliosis   ? history  ? Sleep apnea   ? Does not use CPAP every night  ? Vasovagal syncope   ? one episode only  ? ? ?Patient Active Problem List  ? Diagnosis Date Noted  ? Pain in right hand 06/24/2021  ? Seasonal and perennial allergic rhinitis 07/07/2014  ? Status post laparoscopic hysterectomy 05/22/2014  ? Vasovagal attack 03/07/2014  ? Obstructive sleep apnea 01/17/2013  ? Abnormal CT of the chest 10/30/2012  ? Chest pain 10/07/2012  ? Hypothyroidism   ? Hypertension   ? Allergic asthma, mild intermittent, uncomplicated   ? Fatigue   ? Edema   ? ? ?Past Surgical History:  ?Procedure Laterality Date  ? ABDOMINAL HYSTERECTOMY    ? APPENDECTOMY    ? CYSTOSCOPY  05/22/2014  ? Procedure: CYSTOSCOPY;  Surgeon: Jamey Reas de Berton Lan, MD;  Location: [facility];  Service: Gynecology;;  ? MOUTH SURGERY    ? wisdom teeth ext  ? NASAL SINUS SURGERY    ? x 3  ? ROBOTIC ASSISTED LAPAROSCOPIC LYSIS OF ADHESION Bilateral 05/22/2014  ? Procedure: XI ROBOTIC ASSISTED LAPAROSCOPIC LYSIS OF extensive ADHESION;  Surgeon: Everardo All Amundson de  Berton Lan, MD;  Location: [facility];  Service: Gynecology;  Laterality: Bilateral;  ? ROBOTIC ASSISTED TOTAL HYSTERECTOMY WITH BILATERAL SALPINGO OOPHERECTOMY N/A 05/22/2014  ? Procedure: ROBOTIC ASSISTED TOTAL HYSTERECTOMY WITH BILATERAL SALPINGO OOPHORECTOMY and cystoscopy;  Surgeon: Jamey Reas de Berton Lan, MD;  Location: [facility];  Service: Gynecology;  Laterality: N/A;  ? ? ?OB History   ? ? Gravida  ?0  ? Para  ?0  ? Term  ?0  ? Preterm  ?0  ? AB  ?0  ? Living  ?0  ?  ? ? SAB  ?0  ? IAB  ?0  ? Ectopic  ?0  ? Multiple  ?0  ? Live Births  ?0  ?   ?  ?  ? ? ? ?Home Medications   ? ?Prior to Admission medications   ?Medication Sig Start Date End Date Taking? Authorizing Provider  ?ibuprofen (ADVIL) 800 MG tablet Take 1 tablet (800 mg total) by mouth every 8 (eight) hours as needed (pain). 12/25/21  Yes Barrett Henle, MD  ?Accu-Chek Softclix Lancets lancets  10/20/19  [provider]  ?albuterol (PROVENTIL HFA;VENTOLIN HFA) 108 (90 Base) MCG/ACT inhaler Inhale 2 puffs into the lungs every 4 (four) hours as needed for wheezing or shortness of breath. 12/06/17   Baird Lyons D, MD  ?Blood Glucose Monitoring Suppl DEVI Use as directed once daily. Please dispense meter best covered by insurance. 10/18/19   [provider]  ?calcium carbonate (OS-CAL - DOSED IN MG OF ELEMENTAL CALCIUM) 1250 MG tablet Take 1 tablet by mouth daily.    [provider]  ?DENTA 5000 PLUS 1.1 % CREA dental cream Place 1 application onto teeth at bedtime as needed (dental care).  05/03/14   [provider]  ?famotidine (PEPCID) 20 MG tablet Take 1 tablet (20 mg total) by mouth 2 (two) times daily as needed for heartburn or indigestion. 03/20/21   Petrucelli, Samantha R, PA-C  ?fluticasone (FLONASE) 50 MCG/ACT nasal spray Place 1 spray into both nostrils daily as needed for allergies.  01/11/14   [provider]  ?fluticasone furoate-vilanterol (BREO ELLIPTA) 100-25 MCG/INH AEPB Inhale  1 puff then rinse mouth,once daily maintenance 10/23/19   Baird Lyons D, MD  ?Glucose Blood (BLOOD GLUCOSE TEST STRIPS) STRP Use as instructed once daily. 10/18/19   [provider]  ?ipratropium-albuterol (DUONEB) 0.5-2.5 (3) MG/3ML SOLN Take 3 mLs by nebulization every 6 (six) hours as needed (asthma).    [provider]  ?Lancets MISC Use as directed once daily 10/18/19   [provider]  ?levothyroxine (SYNTHROID, LEVOTHROID) 50 MCG tablet Take 50 mcg by mouth daily before breakfast.     [provider]  ?metFORMIN (GLUCOPHAGE) 500 MG tablet Take 1 tablet by mouth in the morning and at bedtime. 06/19/20   [provider]  ?methylPREDNISolone (MEDROL DOSEPAK) 4 MG TBPK tablet Take as directed. 06/24/21   Sherilyn Cooter, MD  ?mirabegron ER (MYRBETRIQ) 50 MG TB24 tablet Take 50 mg by mouth daily.    [provider]  ?Multiple Vitamin (MULTIVITAMIN WITH MINERALS) TABS Take 1 tablet by mouth every morning.     [provider]  ?Nebulizers (COMPRESSOR/NEBULIZER) MISC Use as directed for asthma 02/03/13   Deneise Lever, MD  ?Omega-3 Fatty Acids (FISH OIL PO) Take 2,400 mg by mouth daily.    [provider]  ?OVER THE COUNTER MEDICATION Place 1 application onto the skin as needed. Essential oil therapy ?Anola Gurney, Stress Away    [provider]  ?Spacer/Aero-Holding Chambers (OPTICHAMBER DIAMOND) [place]  01/27/13   [provider]  ? ? ?Family History ?Family History  ?Problem Relation Age of Onset  ? CAD Maternal Grandmother   ? Ovarian cancer Mother 38  ?     ovarian  ? Heart disease Maternal Grandfather   ? Heart disease Paternal Grandfather   ? Heart disease Paternal Grandmother   ? Heart disease Father   ? Breast cancer Maternal Aunt   ? Emphysema Maternal Uncle   ? ? ?Social History ?Social History  ? ?Tobacco Use  ? Smoking status: Never  ? Smokeless tobacco: Never  ?Vaping Use  ? Vaping Use: Never used   ?Substance Use Topics  ? Alcohol use: No  ?  Alcohol/week: 0.0 standard drinks  ? Drug use: No  ? ? ? ?Allergies   ?Pneumococcal vaccine, Codeine, Hepatitis b vaccine recomb (3-antigen), Hepatitis b virus vaccines, Imitrex [sumatriptan], and Pneumococcal vaccines ? ? ?Review of Systems ?Review of Systems ? ? ?Physical Exam ?Triage Vital Signs ?ED Triage Vitals  ?Enc Vitals Group  ?  BP 12/25/21 1242 (!) 151/80  ?   Pulse Rate 12/25/21 1242 91  ?   Resp 12/25/21 1242 18  ?   Temp 12/25/21 1242 98 ?F (36.7 ?C)  ?   Temp Source 12/25/21 1242 Oral  ?   SpO2 12/25/21 1242 95 %  ?   Weight --   ?   Height --   ?   Head Circumference --   ?   Peak Flow --   ?   Pain Score 12/25/21 1239 6  ?   Pain Loc --   ?   Pain Edu? --   ?   Excl. in [place]? --   ? ?No data found. ? ?Updated Vital Signs ?BP (!) 151/80 (BP Location: Left Arm)   Pulse 91   Temp 98 ?F (36.7 ?C) (Oral)   Resp 18   LMP 05/22/2014 (Exact Date)   SpO2 95%  ? ?Visual Acuity ?Right Eye Distance:   ?Left Eye Distance:   ?Bilateral Distance:   ? ?Right Eye Near:   ?Left Eye Near:    ?Bilateral Near:    ? ?Physical Exam ?Vitals reviewed.  ?Constitutional:   ?   General: She is not in acute distress. ?   Appearance: She is not toxic-appearing.  ?HENT:  ?   Right Ear: Tympanic membrane and ear canal normal.  ?   Left Ear: Tympanic membrane and ear canal normal.  ?   Nose: Nose normal.  ?   Mouth/Throat:  ?   Mouth: Mucous membranes are moist.  ?Cardiovascular:  ?   Rate and Rhythm: Normal rate and regular rhythm.  ?   Heart sounds: No murmur heard. ?Pulmonary:  ?   Effort: Pulmonary effort is normal.  ?   Breath sounds: Normal breath sounds.  ?Skin: ?   Coloration: Skin is not jaundiced or pale.  ?Neurological:  ?   Mental Status: She is alert and oriented to person, place, and time.  ?Psychiatric:     ?   Behavior: Behavior normal.  ? ? ? ?UC Treatments / Results  ?Labs ?(all labs ordered are listed, but only abnormal results are displayed) ?Labs Reviewed - No  data to display ? ?EKG ? ? ?Radiology ?No results found. ? ?Procedures ?Procedures (including critical care time) ? ?Medications Ordered in UC ?Medications - No data to display ? ?Initial Impression / Assessment an

## 2021-12-25 NOTE — ED Triage Notes (Addendum)
Patient presents to Urgent Care with complaints of ear ringing  since Friday. 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  ?Pt st she needs to be back at work by 1;30 pm if possible  ? ?

## 2021-12-25 NOTE — Discharge Instructions (Addendum)
Take ibuprofen 800 mg--1 tab every 8 hours as needed for pain.  ? ?Consider doing a followup visit with the regular location for your worker's comp, so you can possibly have a hearing test done ?

## 2022-01-01 ENCOUNTER — Telehealth: Payer: Self-pay | Admitting: Internal Medicine

## 2022-01-01 ENCOUNTER — Ambulatory Visit (INDEPENDENT_AMBULATORY_CARE_PROVIDER_SITE_OTHER): Payer: BC Managed Care – PPO | Admitting: Orthopedic Surgery

## 2022-01-01 ENCOUNTER — Ambulatory Visit: Payer: Self-pay

## 2022-01-01 DIAGNOSIS — M79641 Pain in right hand: Secondary | ICD-10-CM

## 2022-01-01 MED ORDER — PREDNISONE 10 MG (21) PO TBPK: ORAL_TABLET | Freq: Every day | ORAL | 0 refills | Status: DC

## 2022-01-01 NOTE — Progress Notes (Signed)
? ?Office Visit Note ?  ?Patient: Destiny Sharp           ?Date of Birth: 11-Jul-1971           ?MRN: [id number] ?Visit Date: 01/01/2022 ?             ?Requested by: Vicenta Aly, [address] ?[place],  [place] [address] ?PCP: Vicenta Aly, FNP ? ? ?Assessment & Plan: ?Visit Diagnoses:  ?1. Pain in right hand   ? ? ?Plan: Reviewed today's x-rays with patient which are unremarkable with well-maintained joint spaces at the affected fingers.  Discussed that this seems like an inflammatory arthropathy.  She did respond to steroid Dosepak when she was last seen in October and had relief for several months.  Her symptoms have returned and worsened.  We will try another Medrol Dosepak to see if she gets any more improvement.  We also discussed potentially getting an MRI in the future if she fails to respond. ? ?Follow-Up Instructions: No follow-ups on file.  ? ?Orders:  ?Orders Placed This Encounter  ?Procedures  ? XR Hand Complete Right  ? ?Meds ordered this encounter  ?Medications  ? predniSONE (STERAPRED UNI-PAK 21 TAB) 10 MG (21) TBPK tablet  ?  Sig: Take by mouth daily. Audria Nine, MD  ?  Dispense:  1 each  ?  Refill:  0  ? ? ? ? Procedures: ?No procedures performed ? ? ?Clinical Data: ?No additional findings. ? ? ?Subjective: ?Chief Complaint  ?Patient presents with  ? Right Hand - Pain  ? ? 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  The hand pain is localized to the index finger MP and PIP joints and the small finger MP and PIP joints.  She was last seen in the office in October with a similar complaint involving just the index finger.  She was given a Medrol Dosepak at that time with symptom relief until around Christmas.  Since then her symptoms have returned and have actually worsened.  She has limited use of her hand secondary to pain and swelling in these fingers.  She is tried ice, heat, and Epsom salts.  She has been taking Tylenol  arthritis medication.  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  She denies ever injuring this hand.  She denies any history of gout or known inflammatory arthropathy. ? ? ?Review of Systems ? ? ?Objective: ?Vital Signs: LMP 05/22/2014 (Exact Date)  ? ?Physical Exam ?Constitutional:   ?   Appearance: Normal appearance.  ?Cardiovascular:  ?   Rate and Rhythm: Normal rate.  ?   Pulses: Normal pulses.  ?Pulmonary:  ?   Effort: Pulmonary effort is normal.  ?Skin: ?   General: Skin is warm.  ?   Capillary Refill: Capillary refill takes less than 2 seconds.  ?Neurological:  ?   Mental Status: She is alert.  ? ? ?Right Hand Exam  ? ?Tenderness  ?Right hand tenderness location: TTP at index and small finger MP and PIP joints w/ mild to moderate swelling.  No erythema. ? ?Range of Motion  ?The patient has normal right wrist ROM.  ? ?Other  ?Sensation: normal ?Pulse: present ? ?Comments:  Unable to make a complete fist secondary to pain and swelling in index and small finger.  No MP or PIP instability.  No palpable locking or  catching of fingers.  No cuts, scrapes, or breaks in the skin.  No erythema.  No rashes or skin lesions.  ? ? ? ? ?Specialty Comments:  ?No specialty comments available. ? ?Imaging: ?No results found. ? ? ?PMFS History: ?Patient Active Problem List  ? Diagnosis Date Noted  ? Pain in right hand 06/24/2021  ? Seasonal and perennial allergic rhinitis 07/07/2014  ? Status post laparoscopic hysterectomy 05/22/2014  ? Vasovagal attack 03/07/2014  ? Obstructive sleep apnea 01/17/2013  ? Abnormal CT of the chest 10/30/2012  ? Chest pain 10/07/2012  ? Hypothyroidism   ? Hypertension   ? Allergic asthma, mild intermittent, uncomplicated   ? Fatigue   ? Edema   ? ?Past Medical History:  ?Diagnosis Date  ? Allergy   ? Anxiety   ? no meds  ? Asthma   ? Bronchitis   ? one or two times/year  ? Edema   ? LLE  ? Elevated liver  function tests   ? Fibromyalgia   ? Headache(784.0)   ? otc med prn  ? Hx of cardiovascular stress test 11/22/12  ? Lex MV 2/14: EF 81%, no ischemia  ? Hyperlipidemia   ? diet controlled - no med  ? Hypothyroidism   ? Pneumonia   ? hx  ? PONV (postoperative nausea and vomiting)   ? Scoliosis   ? history  ? Sleep apnea   ? Does not use CPAP every night  ? Vasovagal syncope   ? one episode only  ?  ?Family History  ?Problem Relation Age of Onset  ? CAD Maternal Grandmother   ? Ovarian cancer Mother 19  ?     ovarian  ? Heart disease Maternal Grandfather   ? Heart disease Paternal Grandfather   ? Heart disease Paternal Grandmother   ? Heart disease Father   ? Breast cancer Maternal Aunt   ? Emphysema Maternal Uncle   ?  ?Past Surgical History:  ?Procedure Laterality Date  ? ABDOMINAL HYSTERECTOMY    ? APPENDECTOMY    ? CYSTOSCOPY  05/22/2014  ? Procedure: CYSTOSCOPY;  Surgeon: Jamey Reas de Berton Lan, MD;  Location: [facility];  Service: Gynecology;;  ? MOUTH SURGERY    ? wisdom teeth ext  ? NASAL SINUS SURGERY    ? x 3  ? ROBOTIC ASSISTED LAPAROSCOPIC LYSIS OF ADHESION Bilateral 05/22/2014  ? Procedure: XI ROBOTIC ASSISTED LAPAROSCOPIC LYSIS OF extensive ADHESION;  Surgeon: Everardo All Amundson de Berton Lan, MD;  Location: [facility];  Service: Gynecology;  Laterality: Bilateral;  ? ROBOTIC ASSISTED TOTAL HYSTERECTOMY WITH BILATERAL SALPINGO OOPHERECTOMY N/A 05/22/2014  ? Procedure: ROBOTIC ASSISTED TOTAL HYSTERECTOMY WITH BILATERAL SALPINGO OOPHORECTOMY and cystoscopy;  Surgeon: Jamey Reas de Berton Lan, MD;  Location: [facility];  Service: Gynecology;  Laterality: N/A;  ? ?Social History  ? ?Occupational History  ?  Employer: [employer]  ?  Comment: [employer]  ?Tobacco Use  ? Smoking status: Never  ? Smokeless tobacco: Never  ?Vaping Use  ? Vaping Use: Never used  ?Substance and Sexual Activity  ? Alcohol use: No  ?  Alcohol/week: 0.0 standard drinks  ? Drug use: No  ? Sexual  activity: Never  ?  Partners: Male  ?  Birth control/protection: Surgical  ?  Comment: R-TLH/BSO  ? ? ? ? ? ? ?

## 2022-01-15 ENCOUNTER — Other Ambulatory Visit: Payer: Self-pay | Admitting: Orthopedic Surgery

## 2022-01-15 ENCOUNTER — Encounter: Payer: Self-pay | Admitting: Orthopedic Surgery

## 2022-01-15 ENCOUNTER — Telehealth: Payer: Self-pay | Admitting: Orthopedic Surgery

## 2022-01-15 ENCOUNTER — Ambulatory Visit (INDEPENDENT_AMBULATORY_CARE_PROVIDER_SITE_OTHER): Payer: BC Managed Care – PPO | Admitting: Orthopedic Surgery

## 2022-01-15 DIAGNOSIS — M79641 Pain in right hand: Secondary | ICD-10-CM

## 2022-01-15 NOTE — Telephone Encounter (Signed)
Patient would like OT at [facility]. [place].  ?

## 2022-01-15 NOTE — Telephone Encounter (Signed)
Please advise on duration of OT and what focus point will be. ?

## 2022-01-15 NOTE — Progress Notes (Signed)
? ?Office Visit Note ?  ?Patient: Destiny Sharp           ?Date of Birth: [birth year]           ?MRN: [id number] ?Visit Date: 01/15/2022 ?             ?Requested by: Vicenta Aly, [address] ?[address] ?[place],  [place] [address] ?PCP: Vicenta Aly, FNP ? ? ?Assessment & Plan: ?Visit Diagnoses:  ?1. Pain in right hand   ? ? ?Plan: We discussed previously that her symptoms were likely an inflammatory type of issue.  Her symptoms have nearly resolved with a corticosteroid taper.  She still notes some weakness in the same compared to the contralateral side.  We will start her in some hand therapy to work on strengthening of this hand.  I can see her back in a month or so and see if she is making progress. ? ?Follow-Up Instructions: No follow-ups on file.  ? ?Orders:  ?No orders of the defined types were placed in this encounter. ? ?No orders of the defined types were placed in this encounter. ? ? ? ? Procedures: ?No procedures performed ? ? ?Clinical Data: ?No additional findings. ? ? ?Subjective: ?Chief Complaint  ?Patient presents with  ? Right Hand - Follow-up  ? ? 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  She previous was having pain at the index finger MP and PIP joints in the small finger MP and PIP joints.  She was seen on 4/27 at which time she was started on a Medrol Dosepak.  She notes that she has had significant symptom improvement since that time.  She previously was not able to make a full fist but can do so easily now.  She had significant swelling previously which is nearly resolved.  She was previously having difficulty with her daily tasks including dressing, bathing, and her daily activities.  She is able to do these things much more easily now with minimal discomfort.  She was having difficulty operating the hand brake of the her bus which she is able to do much better now.  She still has some decreased strength in  this hand compared to a year or so ago.  She is never done any hand therapy. ? ?Moses: All but follows a better because my oldest daughter was born on Father's Day.  I became a data involved today ?Medical history June 18 ? ?Review of Systems ? ? ?Objective: ?Vital Signs: LMP 05/22/2014 (Exact Date)  ? ?Physical Exam ? ?Right Hand Exam  ? ?Tenderness  ?Right hand tenderness location: Very mild tenderness at index finger PIP joint. ? ?Other  ?Erythema: absent ?Sensation: normal ?Pulse: present ? ?Comments:  Able to make a full and complete fist with full range of motion of the index finger and small finger MP and PIP joints but with some weakness compared to the contralateral side.  Previous significant swelling is nearly resolved.  No erythema. ? ? ? ? ?Specialty Comments:  ?No specialty comments available. ? ?Imaging: ?No results found. ? ? ?PMFS History: ?Patient Active Problem List  ? Diagnosis Date Noted  ? Pain in right hand 06/24/2021  ? Seasonal and perennial allergic rhinitis 07/07/2014  ? Status post laparoscopic hysterectomy 05/22/2014  ? Vasovagal attack 03/07/2014  ? Obstructive sleep apnea 01/17/2013  ? Abnormal CT of the chest 10/30/2012  ? Chest pain 10/07/2012  ? Hypothyroidism   ?  Hypertension   ? Allergic asthma, mild intermittent, uncomplicated   ? Fatigue   ? Edema   ? ?Past Medical History:  ?Diagnosis Date  ? Allergy   ? Anxiety   ? no meds  ? Asthma   ? Bronchitis   ? one or two times/year  ? Edema   ? LLE  ? Elevated liver function tests   ? Fibromyalgia   ? Headache(784.0)   ? otc med prn  ? Hx of cardiovascular stress test 11/22/12  ? Lex MV 2/14: EF 81%, no ischemia  ? Hyperlipidemia   ? diet controlled - no med  ? Hypothyroidism   ? Pneumonia   ? hx  ? PONV (postoperative nausea and vomiting)   ? Scoliosis   ? history  ? Sleep apnea   ? Does not use CPAP every night  ? Vasovagal syncope   ? one episode only  ?  ?Family History  ?Problem Relation Age of Onset  ? CAD Maternal Grandmother   ?  Ovarian cancer Mother 85  ?     ovarian  ? Heart disease Maternal Grandfather   ? Heart disease Paternal Grandfather   ? Heart disease Paternal Grandmother   ? Heart disease Father   ? Breast cancer Maternal Aunt   ? Emphysema Maternal Uncle   ?  ?Past Surgical History:  ?Procedure Laterality Date  ? ABDOMINAL HYSTERECTOMY    ? APPENDECTOMY    ? CYSTOSCOPY  05/22/2014  ? Procedure: CYSTOSCOPY;  Surgeon: Jamey Reas de Berton Lan, MD;  Location: [facility];  Service: Gynecology;;  ? MOUTH SURGERY    ? wisdom teeth ext  ? NASAL SINUS SURGERY    ? x 3  ? ROBOTIC ASSISTED LAPAROSCOPIC LYSIS OF ADHESION Bilateral 05/22/2014  ? Procedure: XI ROBOTIC ASSISTED LAPAROSCOPIC LYSIS OF extensive ADHESION;  Surgeon: Everardo All Amundson de Berton Lan, MD;  Location: [facility];  Service: Gynecology;  Laterality: Bilateral;  ? ROBOTIC ASSISTED TOTAL HYSTERECTOMY WITH BILATERAL SALPINGO OOPHERECTOMY N/A 05/22/2014  ? Procedure: ROBOTIC ASSISTED TOTAL HYSTERECTOMY WITH BILATERAL SALPINGO OOPHORECTOMY and cystoscopy;  Surgeon: Jamey Reas de Berton Lan, MD;  Location: [facility];  Service: Gynecology;  Laterality: N/A;  ? ?Social History  ? ?Occupational History  ?  Employer: [employer]  ?  Comment: [employer]  ?Tobacco Use  ? Smoking status: Never  ? Smokeless tobacco: Never  ?Vaping Use  ? Vaping Use: Never used  ?Substance and Sexual Activity  ? Alcohol use: No  ?  Alcohol/week: 0.0 standard drinks  ? Drug use: No  ? Sexual activity: Never  ?  Partners: Male  ?  Birth control/protection: Surgical  ?  Comment: R-TLH/BSO  ? ? ? ? ? ? ?

## 2022-01-22 NOTE — Telephone Encounter (Signed)
Pt has not been seen in over a year. Prior to [place] being able to do anything in regards to her cpap machine, she needs to be seen for an appt.   Attempted to call pt but unable to reach. Left message for her to return call.  Routing to front desk pool to try to help with getting pt scheduled for an appt with either Dr. Annamaria Boots or APP.

## 2022-01-23 NOTE — Telephone Encounter (Signed)
Spoke with patient who states the machine that is under recall is for her father. Patient denied scheduling an appointment for herself until everything is situation with her father's machine. Closing encounter and creating one for patient's father.

## 2022-01-28 ENCOUNTER — Ambulatory Visit: Payer: BC Managed Care – PPO | Admitting: Occupational Therapy

## 2022-03-02 ENCOUNTER — Other Ambulatory Visit: Payer: Self-pay | Admitting: Family Medicine

## 2022-03-02 DIAGNOSIS — Z1231 Encounter for screening mammogram for malignant neoplasm of breast: Secondary | ICD-10-CM

## 2022-03-05 DIAGNOSIS — Z1231 Encounter for screening mammogram for malignant neoplasm of breast: Secondary | ICD-10-CM

## 2022-03-09 ENCOUNTER — Other Ambulatory Visit: Payer: Self-pay | Admitting: Family Medicine

## 2022-03-09 DIAGNOSIS — Z1231 Encounter for screening mammogram for malignant neoplasm of breast: Secondary | ICD-10-CM

## 2022-03-11 ENCOUNTER — Ambulatory Visit
Admission: RE | Admit: 2022-03-11 | Discharge: 2022-03-11 | Disposition: A | Discharge status: Home / Self Care | Payer: BC Managed Care – PPO | Source: Ambulatory Visit

## 2022-03-11 DIAGNOSIS — Z1231 Encounter for screening mammogram for malignant neoplasm of breast: Secondary | ICD-10-CM

## 2022-03-13 ENCOUNTER — Other Ambulatory Visit: Payer: Self-pay | Admitting: Family Medicine

## 2022-03-13 DIAGNOSIS — R928 Other abnormal and inconclusive findings on diagnostic imaging of breast: Secondary | ICD-10-CM

## 2022-03-18 ENCOUNTER — Other Ambulatory Visit: Payer: BC Managed Care – PPO

## 2022-03-22 ENCOUNTER — Other Ambulatory Visit: Payer: Self-pay

## 2022-03-22 ENCOUNTER — Emergency Department (HOSPITAL_BASED_OUTPATIENT_CLINIC_OR_DEPARTMENT_OTHER): Payer: BC Managed Care – PPO | Admitting: Radiology

## 2022-03-22 ENCOUNTER — Emergency Department (HOSPITAL_BASED_OUTPATIENT_CLINIC_OR_DEPARTMENT_OTHER)
Admission: EM | Admit: 2022-03-22 | Discharge: 2022-03-22 | Disposition: A | Discharge status: Home / Self Care | Payer: BC Managed Care – PPO | Attending: Emergency Medicine | Admitting: Emergency Medicine

## 2022-03-22 ENCOUNTER — Encounter (HOSPITAL_BASED_OUTPATIENT_CLINIC_OR_DEPARTMENT_OTHER): Payer: Self-pay | Admitting: Emergency Medicine

## 2022-03-22 DIAGNOSIS — M25521 Pain in right elbow: Secondary | ICD-10-CM | POA: Diagnosis present

## 2022-03-22 MED ORDER — DICLOFENAC SODIUM 1 % EX GEL: 4.0000 g | Freq: Four times a day (QID) | CUTANEOUS | 0 refills | Status: AC

## 2022-03-22 NOTE — Discharge Instructions (Signed)
Please follow-up with your hand surgeon  Your x-rays without any fractures, I suspect that this is more of a tendinitis picture.  Since it seems to hurt more when you bring your wrist backwards I wonder if this is perhaps tennis elbow which is another word for lateral epicondylitis.  Please use Voltaren gel 4 times daily.  400 mg of ibuprofen every 6-8 hours.  Also recommend some icing.

## 2022-03-22 NOTE — ED Triage Notes (Signed)
Patient reports hitting her right arm 2 weeks ago. Patient does not remember what she hit her arm on. Yesterday and this morning she was cutting up vegetables when she noticed swelling and increased pain to right elbow.

## 2022-03-22 NOTE — ED Provider Notes (Signed)
[facility] Provider Note   CSN: [id number] Arrival date & time: 03/22/22  1155     History  Chief Complaint  Patient presents with   Arm Injury    Destiny Sharp is a 51 y.o. female.   Arm Injury Patient is a 51 year old female who follows with Dr. Tempie Donning for wrist pain presents emergency room today with complaints of right elbow pain.  She states that her pain has been constant since yesterday morning.  She states she feels it is an achy severe pain worse with movement  She denies any redness or swelling.  No trauma apart from smacking her elbow a couple weeks ago.  She states she did not have significant pain when she struck her elbow 2 weeks ago.  No lacerations abrasions or bleeding.     Home Medications Prior to Admission medications   Medication Sig Start Date End Date Taking? Authorizing Provider  diclofenac Sodium (VOLTAREN) 1 % GEL Apply 4 g topically 4 (four) times daily. 03/22/22  Yes Tedd Sias, PA  Accu-Chek Softclix Lancets lancets  10/20/19   [provider]  albuterol (PROVENTIL HFA;VENTOLIN HFA) 108 (90 Base) MCG/ACT inhaler Inhale 2 puffs into the lungs every 4 (four) hours as needed for wheezing or shortness of breath. 12/06/17   Deneise Lever, MD  Blood Glucose Monitoring Suppl DEVI Use as directed once daily. Please dispense meter best covered by insurance. 10/18/19   [provider]  calcium carbonate (OS-CAL - DOSED IN MG OF ELEMENTAL CALCIUM) 1250 MG tablet Take 1 tablet by mouth daily.    [provider]  DENTA 5000 PLUS 1.1 % CREA dental cream Place 1 application onto teeth at bedtime as needed (dental care).  05/03/14   [provider]  famotidine (PEPCID) 20 MG tablet Take 1 tablet (20 mg total) by mouth 2 (two) times daily as needed for heartburn or indigestion. 03/20/21   Petrucelli, Samantha R, PA-C  fluticasone (FLONASE) 50 MCG/ACT nasal spray Place 1 spray into both nostrils  daily as needed for allergies.  01/11/14   [provider]  fluticasone furoate-vilanterol (BREO ELLIPTA) 100-25 MCG/INH AEPB Inhale 1 puff then rinse mouth,once daily maintenance 10/23/19   Young, Tarri Fuller D, MD  Glucose Blood (BLOOD GLUCOSE TEST STRIPS) STRP Use as instructed once daily. 10/18/19   [provider]  ibuprofen (ADVIL) 800 MG tablet Take 1 tablet (800 mg total) by mouth every 8 (eight) hours as needed (pain). 12/25/21   Barrett Henle, MD  ipratropium-albuterol (DUONEB) 0.5-2.5 (3) MG/3ML SOLN Take 3 mLs by nebulization every 6 (six) hours as needed (asthma).    [provider]  Lancets MISC Use as directed once daily 10/18/19   [provider]  levothyroxine (SYNTHROID, LEVOTHROID) 50 MCG tablet Take 50 mcg by mouth daily before breakfast.     [provider]  metFORMIN (GLUCOPHAGE) 500 MG tablet Take 1 tablet by mouth in the morning and at bedtime. 06/19/20   [provider]  methylPREDNISolone (MEDROL DOSEPAK) 4 MG TBPK tablet Take as directed. 06/24/21   Sherilyn Cooter, MD  mirabegron ER (MYRBETRIQ) 50 MG TB24 tablet Take 50 mg by mouth daily.    [provider]  Multiple Vitamin (MULTIVITAMIN WITH MINERALS) TABS Take 1 tablet by mouth every morning.     [provider]  Nebulizers (COMPRESSOR/NEBULIZER) MISC Use as directed for asthma 02/03/13   Deneise Lever, MD  Omega-3 Fatty Acids (FISH OIL PO) Take 2,400  mg by mouth daily.    [provider]  OVER THE COUNTER MEDICATION Place 1 application onto the skin as needed. Essential oil therapy Rosemary, Eucaliptus, Stress Away    [provider]  predniSONE (STERAPRED UNI-PAK 21 TAB) 10 MG (21) TBPK tablet Take by mouth daily. Audria Nine, MD 01/01/22   Sherilyn Cooter, MD  Spacer/Aero-Holding Josiah Lobo ([place]) [place]  01/27/13   [provider]      Allergies    Pneumococcal vaccine, Codeine, Hepatitis b vaccine  recomb (3-antigen), Hepatitis b virus vaccines, Imitrex [sumatriptan], and Pneumococcal vaccines    Review of Systems   Review of Systems  Physical Exam Updated Vital Signs BP (!) 134/106 (BP Location: Left Arm)   Pulse 97   Temp 98 F (36.7 C) (Oral)   Resp (!) 22   Ht '5\' 2"'$  (1.575 m)   Wt 73.5 kg   LMP 05/22/2014 (Exact Date)   SpO2 99%   BMI 29.63 kg/m  Physical Exam Vitals and nursing note reviewed.  Constitutional:      General: She is not in acute distress.    Appearance: Normal appearance. She is not ill-appearing.  HENT:     Head: Normocephalic and atraumatic.  Eyes:     General: No scleral icterus.       Right eye: No discharge.        Left eye: No discharge.     Conjunctiva/sclera: Conjunctivae normal.  Cardiovascular:     Comments: Radial artery pulses 3+ and symmetric Pulmonary:     Effort: Pulmonary effort is normal.     Breath sounds: No stridor.  Musculoskeletal:     Comments: Tenderness palpation over the lateral aspect of right elbow.  Some tenderness over the olecranon as well.  No erythema swelling or abnormal appearance of elbow.  No shoulder or wrist tenderness.  No forearm or bicep or tricep tenderness.  Patient is guarding her arm and is reluctant to allow me to range however does seem to have good movement in her elbow is much as she allows me to do this.  I will arrange it through at least 45 degrees of motion.  Neurological:     Mental Status: She is alert and oriented to person, place, and time. Mental status is at baseline.     Comments: Sensation intact     ED Results / Procedures / Treatments   Labs (all labs ordered are listed, but only abnormal results are displayed) Labs Reviewed - No data to display  EKG None  Radiology DG Forearm Right  Result Date: 03/22/2022 CLINICAL DATA:  Right elbow injury 4 days ago.  Pain. EXAM: RIGHT FOREARM - 2 VIEW; RIGHT ELBOW - COMPLETE 3+ VIEW COMPARISON:  None Available. FINDINGS: Mild  degenerative changes are present at the coronoid process. Elbow is located. No significant effusion is present. No acute or healing fractures are present. Forearm is otherwise unremarkable. IMPRESSION: 1. No acute or healing fractures. 2. 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 Electronically Signed   By: San Morelle M.D.   On: 03/22/2022 14:11   DG ELBOW COMPLETE RIGHT (3+VIEW)  Result Date: 03/22/2022 CLINICAL DATA:  Right elbow injury 4 days ago.  Pain. EXAM: RIGHT FOREARM - 2 VIEW; RIGHT ELBOW - COMPLETE 3+ VIEW COMPARISON:  None Available. 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 Elbow is located. No significant effusion is present. No acute or healing fractures are present. Forearm is otherwise unremarkable. IMPRESSION: 1. No  acute or healing fractures. 2. 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 Electronically Signed   By: San Morelle M.D.   On: 03/22/2022 14:11    Procedures Procedures    Medications Ordered in ED Medications - No data to display  ED Course/ Medical Decision Making/ A&P                           Medical Decision Making Amount and/or Complexity of Data Reviewed Radiology: ordered.   Patient is a 51 year old female who follows with Dr. Tempie Donning for wrist pain presents emergency room today with complaints of right elbow pain.  She states that her pain has been constant since yesterday morning.  She states she feels it is an achy severe pain worse with movement  She denies any redness or swelling.  No trauma apart from smacking her elbow a couple weeks ago.  She states she did not have significant pain when she struck her elbow 2 weeks ago.  No lacerations abrasions or bleeding.   I personally viewed x-rays of right elbow and forearm which are without any fractures or dislocation.  Distally neurovascularly intact.  Ultimately suspect some form of tendinitis or other MSK injury.  Perhaps lateral  epicondylitis given her symptoms and discomfort with dorsiflexion of the wrist.  Final Clinical Impression(s) / ED Diagnoses Final diagnoses:  Right elbow pain    Rx / DC Orders ED Discharge Orders          Ordered    diclofenac Sodium (VOLTAREN) 1 % GEL  4 times daily        03/22/22 1625              Pati Gallo [place], [place] 03/22/22 1632    Elnora Morrison, MD 03/23/22 0041

## 2022-03-23 ENCOUNTER — Ambulatory Visit
Admission: RE | Admit: 2022-03-23 | Discharge: 2022-03-23 | Disposition: A | Discharge status: Home / Self Care | Payer: BC Managed Care – PPO | Source: Ambulatory Visit | Attending: Family Medicine | Admitting: Family Medicine

## 2022-03-23 ENCOUNTER — Ambulatory Visit: Payer: BC Managed Care – PPO

## 2022-03-23 ENCOUNTER — Ambulatory Visit: Payer: BC Managed Care – PPO | Attending: Orthopedic Surgery | Admitting: Occupational Therapy

## 2022-03-23 DIAGNOSIS — M25641 Stiffness of right hand, not elsewhere classified: Secondary | ICD-10-CM | POA: Diagnosis present

## 2022-03-23 DIAGNOSIS — M25521 Pain in right elbow: Secondary | ICD-10-CM | POA: Insufficient documentation

## 2022-03-23 DIAGNOSIS — M79641 Pain in right hand: Secondary | ICD-10-CM | POA: Diagnosis not present

## 2022-03-23 DIAGNOSIS — R928 Other abnormal and inconclusive findings on diagnostic imaging of breast: Secondary | ICD-10-CM

## 2022-03-23 DIAGNOSIS — M6281 Muscle weakness (generalized): Secondary | ICD-10-CM | POA: Diagnosis present

## 2022-03-23 NOTE — Therapy (Signed)
OUTPATIENT OCCUPATIONAL THERAPY ORTHO EVALUATION  Patient Name: Destiny Sharp MRN: [id number] DOB:[birth year], 51 y.o., female Today's Date: 03/23/2022  PCP: Vicenta Aly, [place] REFERRING PROVIDER: Sherilyn Cooter, MD    OT End of Session - 03/23/22 1134     Visit Number 1    Number of Visits 17    Date for OT Re-Evaluation 05/22/22    Authorization Type [employer] PPO    OT Start Time 0935    OT Stop Time 1021    OT Time Calculation (min) 46 min             Past Medical History:  Diagnosis Date   Allergy    Anxiety    no meds   Asthma    Bronchitis    one or two times/year   Edema    LLE   Elevated liver function tests    Fibromyalgia    Headache(784.0)    otc med prn   Hx of cardiovascular stress test 11/22/12   Lex MV 2/14: EF 81%, no ischemia   Hyperlipidemia    diet controlled - no med   Hypothyroidism    Pneumonia    hx   PONV (postoperative nausea and vomiting)    Scoliosis    history   Sleep apnea    Does not use CPAP every night   Vasovagal syncope    one episode only   Past Surgical History:  Procedure Laterality Date   ABDOMINAL HYSTERECTOMY     APPENDECTOMY     CYSTOSCOPY  05/22/2014   Procedure: CYSTOSCOPY;  Surgeon: Jamey Reas de Berton Lan, MD;  Location: [facility];  Service: Gynecology;;   MOUTH SURGERY     wisdom teeth ext   NASAL SINUS SURGERY     x 3   ROBOTIC ASSISTED LAPAROSCOPIC LYSIS OF ADHESION Bilateral 05/22/2014   Procedure: XI ROBOTIC ASSISTED LAPAROSCOPIC LYSIS OF extensive ADHESION;  Surgeon: Jamey Reas de Berton Lan, MD;  Location: [facility];  Service: Gynecology;  Laterality: Bilateral;   ROBOTIC ASSISTED TOTAL HYSTERECTOMY WITH BILATERAL SALPINGO OOPHERECTOMY N/A 05/22/2014   Procedure: ROBOTIC ASSISTED TOTAL HYSTERECTOMY WITH BILATERAL SALPINGO OOPHORECTOMY and cystoscopy;  Surgeon: Jamey Reas de Berton Lan, MD;  Location: [facility];  Service: Gynecology;  Laterality: N/A;   Patient  Active Problem List   Diagnosis Date Noted   Pain in right hand 06/24/2021   Seasonal and perennial allergic rhinitis 07/07/2014   Status post laparoscopic hysterectomy 05/22/2014   Vasovagal attack 03/07/2014   Obstructive sleep apnea 01/17/2013   Abnormal CT of the chest 10/30/2012   Chest pain 10/07/2012   Hypothyroidism    Hypertension    Allergic asthma, mild intermittent, uncomplicated    Fatigue    Edema     ONSET DATE: October 2022 (referral date 01/15/2022)  REFERRING DIAG: M79.641 (ICD-10-CM) - Pain in right hand   THERAPY DIAG:  Pain in right hand  Muscle weakness (generalized)  Stiffness of right hand, not elsewhere classified  Pain in right elbow  Rationale for Evaluation and Treatment Rehabilitation  SUBJECTIVE:   SUBJECTIVE STATEMENT: Pt reports: My dog pulled on the leash real hard and pulled at my elbow about a week ago.  Yesterday I was cutting vegetables and when I went to scrape the scraps off the cutting board it hurt really bad.  I went to the ER and they think it is tendonitis of some sort.  I have been dealing with my hand for  some time now, it swells up and gets stiff sometimes. The pain and stiffness impact my ability to engage in self-care tasks and work tasks.  Pt is a school bus driver. Pt accompanied by: self  PERTINENT HISTORY: R hand pain, seasonal allergies, obstructive sleep apnea, hypothyroidism, HTN  PRECAUTIONS: None  WEIGHT BEARING RESTRICTIONS No  PAIN:  Are you having pain? Yes: NPRS scale: 3/10 Pain location: R elbow Pain description: constant, shooting from elbow to hand and elbow to shoulder Aggravating factors: movement in certain directions or pulling on anything Relieving factors: rest, ice/heat  FALLS: Has patient fallen in last 6 months? 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 Lives in: House/apartment Stairs: Yes: Internal: full flight to basement, but rarely  goes down the steps Has following equipment at home: Grab bars and small ledge in corner of tub/shower  PLOF: Independent  PATIENT GOALS to regain use of hand, specifically to open things  OBJECTIVE:   HAND DOMINANCE: Right  ADLs: Transfers/ambulation related to ADLs: Independent Eating: some difficulty/pain when opening water bottle Grooming: brushing hair is challenging UB Dressing: difficulty with getting bra and clasping bra LB Dressing: difficulty with fastening pants and donning compression stockings Toileting: Independent Bathing: Independent Tub Shower transfers: Mod I Equipment: Grab bars  FUNCTIONAL OUTCOME MEASURES: Quick Dash: 50%  UPPER EXTREMITY ROM     Active ROM Right eval Left eval  Shoulder flexion    Shoulder abduction    Shoulder adduction    Shoulder extension    Shoulder internal rotation    Shoulder external rotation    Elbow flexion    Elbow extension    Wrist flexion 45   Wrist extension 45   Wrist ulnar deviation    Wrist radial deviation    Wrist pronation    Wrist supination    (Blank rows = not tested)   Active ROM Right eval Left eval  Thumb MCP (0-60) 55   Thumb IP (0-80) 80   Thumb Radial abd/add (0-55)     Thumb Palmar abd/add (0-45)     Thumb Opposition to Small Finger     Index MCP (0-90) 75    Index PIP (0-100) 95    Index DIP (0-70)  40    Long MCP (0-90)      Long PIP (0-100)      Long DIP (0-70)      Ring MCP (0-90)      Ring PIP (0-100)      Ring DIP (0-70)      Little MCP (0-90)      Little PIP (0-100)      Little DIP (0-70)      (Blank rows = not tested)  UPPER EXTREMITY MMT:     MMT Right eval Left eval  Shoulder flexion    Shoulder abduction    Shoulder adduction    Shoulder extension    Shoulder internal rotation    Shoulder external rotation    Middle trapezius    Lower trapezius    Elbow flexion    Elbow extension    Wrist flexion    Wrist extension    Wrist ulnar deviation    Wrist  radial deviation    Wrist pronation    Wrist supination    (Blank rows = not tested)  HAND FUNCTION: Grip strength: Right: 15 lbs; Left: 45 lbs  COORDINATION: Box and Blocks:  Right 16 blocks, Left 29 blocks Completed  in 30 seconds instead of typical 60 seconds due to increasing pain in RUE with activity.  SENSATION: WFL  EDEMA: mild edema in fingers and hand  COGNITION: Overall cognitive status: Within functional limits for tasks assessed  OBSERVATIONS: guarding at R elbow and hand.  Increased swelling at hand and digits.   TODAY'S TREATMENT:  N/A   PATIENT EDUCATION: Education details: Educated on role and purpose of OT as well as potential interventions and goals for therapy based on initial evaluation findings. Person educated: Patient Education method: Explanation Education comprehension: verbalized understanding   HOME EXERCISE PROGRAM: TBD  GOALS: Goals reviewed with patient? No  SHORT TERM GOALS: Target date:  04/24/2022     Pt will be independent with HEP for RUE strengthening and coordination to increase independence with ADLs and IADLs. Baseline: Goal status: INITIAL  2.  Pt will be able to utilize RUE at diminished level when opening water bottle and other meal prep items. Baseline:  Goal status: INITIAL  3.  Pt will verbalize understanding of task modifications and/or potential A/E needs to increase ease, safety, and independence w/ ADLS Baseline:  Goal status: INITIAL  4.  Pt will demonstrate improved UE functional use for ADLs as evidenced by increasing box/ blocks score by 3 blocks with RUE Baseline: R: 16 and L: 29 (in 30 seconds bilaterally) Goal status: INITIAL   LONG TERM GOALS: Target date:  05/22/2022     Pt will demonstrate increased grip strength to 20# to 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 in pain > 2 on 10 point pain scale Baseline: R: 15# and L: 45# Goal status: INITIAL  2.  Pt will  demonstrate ability to utilize RUE at dominant level when opening water bottle or other meal prep tasks without increase in pain >2 on 10 point pain scale. Baseline:  Goal status: INITIAL  3.  Pt will demonstrate improved UE functional use for ADLs as evidenced by increasing box/ blocks score by 6 blocks with RUE Baseline: R: 16 and L: 29 (in 30 second time limit) Goal status: INITIAL  4.  Pt will report improvements in pain and functional use of dominant RUE as reflected on QuickDASH by improving score from 50% to 30%. Baseline: 50% Goal status: INITIAL  5.  Pt will report improvements in functional use of RUE to complete ADLs and IADLs without increase in pain. Baseline:  Goal status: INITIAL    ASSESSMENT:  CLINICAL IMPRESSION: Patient is a 51 y.o. female who was seen today for occupational therapy evaluation for dominant RUE hand pain.  Pt reports ongoing pain in RUE, limiting ability to engage in ADLs, IADLs, and work tasks due to weakness and pain. Pt has demonstrated improvements in pain with corticosteroid taper, however due to ongoing issues MD thinks it may be related to some sort of inflammatory type issue.  Pt's father lives with her, for whom she is a caregiver.  Pt will benefit from skilled occupational therapy services to address strength and coordination, ROM, pain management, GM/FM control, safety awareness, introduction of compensatory strategies/AE prn, and implementation of an HEP to improve participation and safety during ADLs, IADLs, and work performance.   PERFORMANCE DEFICITS in functional skills including ADLs, IADLs, coordination, dexterity, edema, ROM, strength, pain, flexibility, FMC, GMC, mobility, decreased knowledge of precautions, decreased knowledge of use of DME, and UE functional use.  IMPAIRMENTS are limiting patient from ADLs, IADLs, and work.   COMORBIDITIES may have co-morbidities  that affects  occupational performance. Patient will benefit from  skilled OT to address above impairments and improve overall function.  MODIFICATION OR ASSISTANCE TO COMPLETE EVALUATION: Min-Moderate modification of tasks or assist with assess necessary to complete an evaluation.  OT OCCUPATIONAL PROFILE AND HISTORY: Detailed assessment: Review of records and additional review of physical, cognitive, psychosocial history related to current functional performance.  CLINICAL DECISION MAKING: Moderate - several treatment options, min-mod task modification necessary  REHAB POTENTIAL: Good  EVALUATION COMPLEXITY: Moderate      PLAN: OT FREQUENCY: 2x/week  OT DURATION: 8 weeks  PLANNED INTERVENTIONS: self care/ADL training, therapeutic exercise, therapeutic activity, manual therapy, passive range of motion, splinting, ultrasound, paraffin, fluidotherapy, compression bandaging, moist heat, cryotherapy, patient/family education, and DME and/or AE instructions  RECOMMENDED OTHER SERVICES: N/A  CONSULTED AND AGREED WITH PLAN OF CARE: Patient  PLAN FOR NEXT SESSION: Begin PROM with focus on fingers and wrist, begin grip strengthening as able.   Simonne Come, OTR/L 03/23/2022, 11:40 AM

## 2022-03-24 ENCOUNTER — Ambulatory Visit (INDEPENDENT_AMBULATORY_CARE_PROVIDER_SITE_OTHER): Payer: BC Managed Care – PPO | Admitting: Orthopedic Surgery

## 2022-03-24 DIAGNOSIS — M7711 Lateral epicondylitis, right elbow: Secondary | ICD-10-CM

## 2022-03-24 MED ORDER — DICLOFENAC SODIUM 75 MG PO TBEC: 75.0000 mg | DELAYED_RELEASE_TABLET | Freq: Two times a day (BID) | ORAL | 0 refills | Status: AC

## 2022-03-24 NOTE — Progress Notes (Signed)
Office Visit Note   Patient: Destiny Sharp           Date of Birth: [birth year]           MRN: [id number] Visit Date: 03/24/2022              Requested by: Vicenta Aly, [place],  [place] [id number] PCP: Vicenta Aly, FNP   Assessment & Plan: Visit Diagnoses:  1. Lateral epicondylitis, right elbow     Plan: Discussed with patient that her history and exam findings seem most consistent with acute right lateral epicondylitis.  She has tenderness directly over the lateral epicondyle that is worse with resisted middle finger and wrist extension.  She has no subjective instability which would be concerning for an underlying collateral ligament injury.  X-rays in the ER were negative for acute bony injury.  We discussed trying a strong oral anti-inflammatory medication.  She is already has hand therapy set up to work on overall hand conditioning and will have them work on stretching and strengthening the elbow.  I can see her back in a few weeks if she remains symptomatic following a course of anti-inflammatory medication and therapy.  Follow-Up Instructions: No follow-ups on file.   Orders:  No orders of the defined types were placed in this encounter.  No orders of the defined types were placed in this encounter.     Procedures: No procedures performed   Clinical Data: No additional findings.   Subjective: Chief Complaint  Patient presents with   Right Elbow - Pain    This is a 51 year old female who presents with right elbow pain she was previously seen by me for pain in the right index and middle finger MCP joints which responded well to an oral corticosteroid taper.  She notes that 1 or 2 weeks ago she was playing with her puppy when the dog pulled her arm back with the leash.  She has since described pain and swelling at the lateral aspect of the elbow.  She was seen in the ER where x-rays were obtained and were negative for acute  bony injury.  Since the incident she has been using ice, ibuprofen, and Tylenol as needed.  She is able to fully flex and extend the elbow but has pain at the lateral epicondyle.  She denies any subjective instability at the elbow.    Review of Systems   Objective: Vital Signs: LMP 05/22/2014 (Exact Date)   Physical Exam Constitutional:      Appearance: Normal appearance.  Cardiovascular:     Rate and Rhythm: Normal rate.     Pulses: Normal pulses.  Pulmonary:     Effort: Pulmonary effort is normal.  Skin:    General: Skin is warm and dry.     Capillary Refill: Capillary refill takes less than 2 seconds.  Neurological:     Mental Status: She is alert.     Right Elbow Exam   Tenderness  The patient is experiencing tenderness in the lateral epicondyle.   Range of Motion  The patient has normal right elbow ROM.  Other  Erythema: absent Sensation: normal Pulse: present  Comments:  TTP directly over the lateral epicondyle and just distal with mild to moderate swelling.  Reproduction of pain w/ resisted middle finger and wrist extension with the elbow extended.  No varus instability though some guarding on exam.  Full flexion/extension/pronation/supination.       Specialty Comments:  No  specialty comments available.  Imaging: No results found.   PMFS History: Patient Active Problem List   Diagnosis Date Noted   Lateral epicondylitis, right elbow 03/24/2022   Pain in right hand 06/24/2021   Seasonal and perennial allergic rhinitis 07/07/2014   Status post laparoscopic hysterectomy 05/22/2014   Vasovagal attack 03/07/2014   Obstructive sleep apnea 01/17/2013   Abnormal CT of the chest 10/30/2012   Chest pain 10/07/2012   Hypothyroidism    Hypertension    Allergic asthma, mild intermittent, uncomplicated    Fatigue    Edema    Past Medical History:  Diagnosis Date   Allergy    Anxiety    no meds   Asthma    Bronchitis    one or two times/year    Edema    LLE   Elevated liver function tests    Fibromyalgia    Headache(784.0)    otc med prn   Hx of cardiovascular stress test 11/22/12   Lex MV 2/14: EF 81%, no ischemia   Hyperlipidemia    diet controlled - no med   Hypothyroidism    Pneumonia    hx   PONV (postoperative nausea and vomiting)    Scoliosis    history   Sleep apnea    Does not use CPAP every night   Vasovagal syncope    one episode only    Family History  Problem Relation Age of Onset   CAD Maternal Grandmother    Ovarian cancer Mother 18       ovarian   Heart disease Maternal Grandfather    Heart disease Paternal Grandfather    Heart disease Paternal Grandmother    Heart disease Father    Breast cancer Maternal Aunt    Emphysema Maternal Uncle     Past Surgical History:  Procedure Laterality Date   ABDOMINAL HYSTERECTOMY     APPENDECTOMY     CYSTOSCOPY  05/22/2014   Procedure: CYSTOSCOPY;  Surgeon: Everardo All Amundson de Berton Lan, MD;  Location: [facility];  Service: Gynecology;;   MOUTH SURGERY     wisdom teeth ext   NASAL SINUS SURGERY     x 3   ROBOTIC ASSISTED LAPAROSCOPIC LYSIS OF ADHESION Bilateral 05/22/2014   Procedure: XI ROBOTIC ASSISTED LAPAROSCOPIC LYSIS OF extensive ADHESION;  Surgeon: Jamey Reas de Berton Lan, MD;  Location: [facility];  Service: Gynecology;  Laterality: Bilateral;   ROBOTIC ASSISTED TOTAL HYSTERECTOMY WITH BILATERAL SALPINGO OOPHERECTOMY N/A 05/22/2014   Procedure: ROBOTIC ASSISTED TOTAL HYSTERECTOMY WITH BILATERAL SALPINGO OOPHORECTOMY and cystoscopy;  Surgeon: Jamey Reas de Berton Lan, MD;  Location: [facility];  Service: Gynecology;  Laterality: N/A;   Social History   Occupational History    Employer: [employer]    Comment: [employer] School [employer]  Tobacco Use   Smoking status: Never   Smokeless tobacco: Never  Vaping Use   Vaping Use: Never used  Substance and Sexual Activity   Alcohol use: No    Alcohol/week: 0.0 standard drinks  of alcohol   Drug use: No   Sexual activity: Never    Partners: Male    Birth control/protection: Surgical    Comment: R-TLH/BSO

## 2022-03-25 ENCOUNTER — Ambulatory Visit: Payer: BC Managed Care – PPO | Admitting: Occupational Therapy

## 2022-03-25 DIAGNOSIS — M79641 Pain in right hand: Secondary | ICD-10-CM

## 2022-03-25 DIAGNOSIS — M25641 Stiffness of right hand, not elsewhere classified: Secondary | ICD-10-CM

## 2022-03-25 DIAGNOSIS — M6281 Muscle weakness (generalized): Secondary | ICD-10-CM

## 2022-03-25 DIAGNOSIS — M25521 Pain in right elbow: Secondary | ICD-10-CM

## 2022-03-25 NOTE — Therapy (Signed)
OUTPATIENT OCCUPATIONAL THERAPY TREATMENT NOTE  Patient Name: Destiny Sharp MRN: [id number] DOB:1971/01/26, 51 y.o., female Today's Date: 03/25/2022  PCP: Vicenta Aly, [place] REFERRING PROVIDER: Sherilyn Cooter, MD    OT End of Session - 03/25/22 1115     Visit Number 2    Number of Visits 17    Date for OT Re-Evaluation 05/22/22    Authorization Type BC[employer] PPO    OT Start Time 1108    OT Stop Time 1148    OT Time Calculation (min) 40 min    Behavior During Therapy WFL for tasks assessed/performed            Past Medical History:  Diagnosis Date   Allergy    Anxiety    no meds   Asthma    Bronchitis    one or two times/year   Edema    LLE   Elevated liver function tests    Fibromyalgia    Headache(784.0)    otc med prn   Hx of cardiovascular stress test 11/22/12   Lex MV 2/14: EF 81%, no ischemia   Hyperlipidemia    diet controlled - no med   Hypothyroidism    Pneumonia    hx   PONV (postoperative nausea and vomiting)    Scoliosis    history   Sleep apnea    Does not use CPAP every night   Vasovagal syncope    one episode only   Past Surgical History:  Procedure Laterality Date   ABDOMINAL HYSTERECTOMY     APPENDECTOMY     CYSTOSCOPY  05/22/2014   Procedure: CYSTOSCOPY;  Surgeon: Jamey Reas de Berton Lan, MD;  Location: [facility];  Service: Gynecology;;   MOUTH SURGERY     wisdom teeth ext   NASAL SINUS SURGERY     x 3   ROBOTIC ASSISTED LAPAROSCOPIC LYSIS OF ADHESION Bilateral 05/22/2014   Procedure: XI ROBOTIC ASSISTED LAPAROSCOPIC LYSIS OF extensive ADHESION;  Surgeon: Jamey Reas de Berton Lan, MD;  Location: [facility];  Service: Gynecology;  Laterality: Bilateral;   ROBOTIC ASSISTED TOTAL HYSTERECTOMY WITH BILATERAL SALPINGO OOPHERECTOMY N/A 05/22/2014   Procedure: ROBOTIC ASSISTED TOTAL HYSTERECTOMY WITH BILATERAL SALPINGO OOPHORECTOMY and cystoscopy;  Surgeon: Jamey Reas de Berton Lan, MD;  Location: [facility];   Service: Gynecology;  Laterality: N/A;   Patient Active Problem List   Diagnosis Date Noted   Lateral epicondylitis, right elbow 03/24/2022   Pain in right hand 06/24/2021   Seasonal and perennial allergic rhinitis 07/07/2014   Status post laparoscopic hysterectomy 05/22/2014   Vasovagal attack 03/07/2014   Obstructive sleep apnea 01/17/2013   Abnormal CT of the chest 10/30/2012   Chest pain 10/07/2012   Hypothyroidism    Hypertension    Allergic asthma, mild intermittent, uncomplicated    Fatigue    Edema     ONSET DATE: October 2022 (referral date 01/15/2022)  REFERRING DIAG: M79.641 (ICD-10-CM) - Pain in right hand   THERAPY DIAG:  Pain in right elbow  Pain in right hand  Muscle weakness (generalized)  Stiffness of right hand, not elsewhere classified  Rationale for Evaluation and Treatment Rehabilitation  SUBJECTIVE:   SUBJECTIVE STATEMENT: "I have to get this better so I can walk my dogs." Pt 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 Pt accompanied by: self  PERTINENT HISTORY: R hand pain, seasonal allergies, obstructive sleep apnea, hypothyroidism, HTN  PAIN: Are you having pain? 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 Pt positioned w/ forearm resting on tabletop surface and  tolerated intervention w/out pain, though tolerable level of discomfort reported to palpation around common extensor tendon origin. Corresponding education provided on self-massage in clockwise, counter-clockwise, perpendicular, and parallel directions w/ pt returning demonstration.   PATIENT EDUCATION: Provided condition-specific education, including: Typical progression through phases of protocol for lateral epicondylitis and anticipated timeline for recovery Benefit for soft tissue massage and moist heat therapy at home for focus on initial pain management Review of movement precautions and considerations to avoid aggravation of pain (e.g., no repetitive pinching and pulling, grasp and lift objects w/ forearm supination) Likely benefit of combination of counterforce "tennis elbow" brace and wrist immobilization orthosis to prevent movements that aggravate the pain and contribute to symptoms Person educated: Patient Education method: Explanation Education comprehension: verbalized understanding   HOME EXERCISE PROGRAM: Soft tissue massage for ~5 min, 2x/day Moist heat for ~15 min, 2x/day   GOALS: Goals reviewed with patient? Yes  SHORT TERM GOALS: Target date:  04/24/2022      Pt will be independent with HEP for RUE strengthening and coordination to increase independence with ADLs and IADLs. Baseline: Goal status: IN PROGRESS  2.  Pt will be able to utilize RUE at diminished level when opening water bottle and other meal prep items. Baseline:  Goal status: IN PROGRESS  3.  Pt will verbalize understanding of task modifications and/or potential A/E needs to increase ease, safety, and independence w/ ADLS Baseline:  Goal status: IN PROGRESS  4.  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  Pt will demonstrate ability to utilize RUE at dominant level when opening water bottle or other meal prep tasks without increase in pain >2 on 10 point pain scale. Baseline:  Goal status: IN PROGRESS  3.  Pt will demonstrate improved UE functional use for ADLs as evidenced by increasing box/ blocks score by 6 blocks with RUE Baseline: R: 16 and L: 29 (in 30 second time limit) Goal status: IN PROGRESS  4.  Pt will report improvements in pain and functional use of dominant RUE as reflected on QuickDASH by improving score from 50% to 30%. Baseline: 50% Goal status: IN PROGRESS  5.  Pt will report improvements in functional use of RUE to complete ADLs and IADLs without increase in pain. Baseline:  Goal status: IN PROGRESS    ASSESSMENT:  CLINICAL IMPRESSION: Pt arrives for first treatment session following initial evaluation on 03/23/22. Pt reviewed goals and POC w/ pt who is agreeable to plan at this time. OT then focused session on pertinent education related to first phase of treatment for lateral epicondylitis symptoms considering likely diagnosis. Initial phase of recovery focuses on pain management prior to incorporation of any stretches or exercises for ROM/strength. Pt believes she has a prefabricated wrist immobilization orthosis at home and plans to order a tennis elbow counterforce brace, bringing both for fitting next session. Considering significance of symptoms, OT reviewed potential benefit of adjusting plan prn to allow for focus on pain management before continuing guided progression through additional phases of rehab protocol; pt agreeable.  PERFORMANCE DEFICITS in functional skills including ADLs, IADLs, coordination, dexterity, edema, ROM,  strength, pain, flexibility, FMC, GMC, mobility, decreased knowledge of precautions, decreased knowledge of use of DME, and UE functional use.  IMPAIRMENTS are limiting patient from ADLs, IADLs, and work.   COMORBIDITIES may have co-morbidities  that affects occupational performance. Patient will benefit from skilled OT to address above impairments and improve overall function.   PLAN: OT FREQUENCY: 2x/week  OT DURATION: 8 weeks  PLANNED INTERVENTIONS: self care/ADL training, therapeutic exercise, therapeutic activity, manual therapy, passive range of motion, splinting, ultrasound, paraffin, fluidotherapy, compression bandaging, moist heat, cryotherapy, patient/family education, and DME and/or AE instructions  RECOMMENDED OTHER SERVICES: N/A  CONSULTED AND AGREED WITH PLAN OF CARE: Patient  PLAN FOR NEXT SESSION: Continue w/ initial pain management: soft tissue massage to posterior forearm, [place] modality if appropriate; fit counterforce brace and/or wrist immobilizer w/ instructions for wear and care; introduce compensatory strategies/AE to avoid aggravating movements prn   Kathrine Cords, OTR/L 03/25/2022, 2:43 PM

## 2022-04-01 ENCOUNTER — Ambulatory Visit: Payer: BC Managed Care – PPO | Admitting: Occupational Therapy

## 2022-04-01 ENCOUNTER — Encounter: Payer: Self-pay | Admitting: Occupational Therapy

## 2022-04-01 DIAGNOSIS — M79641 Pain in right hand: Secondary | ICD-10-CM

## 2022-04-01 DIAGNOSIS — M6281 Muscle weakness (generalized): Secondary | ICD-10-CM

## 2022-04-01 DIAGNOSIS — M25641 Stiffness of right hand, not elsewhere classified: Secondary | ICD-10-CM

## 2022-04-01 DIAGNOSIS — M25521 Pain in right elbow: Secondary | ICD-10-CM

## 2022-04-01 NOTE — Therapy (Signed)
OUTPATIENT OCCUPATIONAL THERAPY TREATMENT NOTE  Patient Name: Destiny Sharp MRN: [id number] DOB:05-Jul-1971, 51 y.o., female Today's Date: 04/01/2022  PCP: Vicenta Aly, [place] REFERRING PROVIDER: Sherilyn Cooter, MD    OT End of Session - 04/01/22 1455     Visit Number 3    Number of Visits 17    Date for OT Re-Evaluation 05/22/22    Authorization Type [employer] PPO    OT Start Time 1452    OT Stop Time 1537    OT Time Calculation (min) 45 min    Activity Tolerance Patient tolerated treatment well    Behavior During Therapy WFL for tasks assessed/performed            Past Medical History:  Diagnosis Date   Allergy    Anxiety    no meds   Asthma    Bronchitis    one or two times/year   Edema    LLE   Elevated liver function tests    Fibromyalgia    Headache(784.0)    otc med prn   Hx of cardiovascular stress test 11/22/12   Lex MV 2/14: EF 81%, no ischemia   Hyperlipidemia    diet controlled - no med   Hypothyroidism    Pneumonia    hx   PONV (postoperative nausea and vomiting)    Scoliosis    history   Sleep apnea    Does not use CPAP every night   Vasovagal syncope    one episode only   Past Surgical History:  Procedure Laterality Date   ABDOMINAL HYSTERECTOMY     APPENDECTOMY     CYSTOSCOPY  05/22/2014   Procedure: CYSTOSCOPY;  Surgeon: Jamey Reas de Berton Lan, MD;  Location: [facility];  Service: Gynecology;;   MOUTH SURGERY     wisdom teeth ext   NASAL SINUS SURGERY     x 3   ROBOTIC ASSISTED LAPAROSCOPIC LYSIS OF ADHESION Bilateral 05/22/2014   Procedure: XI ROBOTIC ASSISTED LAPAROSCOPIC LYSIS OF extensive ADHESION;  Surgeon: Jamey Reas de Berton Lan, MD;  Location: [facility];  Service: Gynecology;  Laterality: Bilateral;   ROBOTIC ASSISTED TOTAL HYSTERECTOMY WITH BILATERAL SALPINGO OOPHERECTOMY N/A 05/22/2014   Procedure: ROBOTIC ASSISTED TOTAL HYSTERECTOMY WITH BILATERAL SALPINGO OOPHORECTOMY and cystoscopy;  Surgeon:  Jamey Reas de Berton Lan, MD;  Location: [facility];  Service: Gynecology;  Laterality: N/A;   Patient Active Problem List   Diagnosis Date Noted   Lateral epicondylitis, right elbow 03/24/2022   Pain in right hand 06/24/2021   Seasonal and perennial allergic rhinitis 07/07/2014   Status post laparoscopic hysterectomy 05/22/2014   Vasovagal attack 03/07/2014   Obstructive sleep apnea 01/17/2013   Abnormal CT of the chest 10/30/2012   Chest pain 10/07/2012   Hypothyroidism    Hypertension    Allergic asthma, mild intermittent, uncomplicated    Fatigue    Edema     ONSET DATE: October 2022 (referral date 01/15/2022)  REFERRING DIAG: M79.641 (ICD-10-CM) - Pain in right hand   THERAPY DIAG:  Pain in right elbow  Pain in right hand  Muscle weakness (generalized)  Stiffness of right hand, not elsewhere classified  Rationale for Evaluation and Treatment Rehabilitation  SUBJECTIVE:   SUBJECTIVE STATEMENT: Pt reports having a massage on Friday and asked her masseuse to target that area; states it has been feeling a lot better since then Pt accompanied by: self  PERTINENT HISTORY: R hand pain, seasonal allergies, obstructive sleep apnea,  hypothyroidism, HTN  PAIN: Are you having pain? Yes: NPRS scale: 1/10 Pain location: R elbow Pain description: constant, shooting from elbow to hand and elbow to shoulder Aggravating factors: movement in certain directions or pulling on anything Relieving factors: rest, ice/heat  LIVING ENVIRONMENT: Lives with: lives with their family and is caretaker for adult father  PLOF: Independent  PATIENT GOALS to regain use of hand, specifically to open things  OBJECTIVE:   HAND DOMINANCE: Right   TODAY'S TREATMENT - 04/01/22  Orthosis Fit Pt arrived wearing both prefabricated counterforce "tennis elbow" brace and wrist immobilization orthosis for prevention of movements that aggravate pain and contribute to symptoms; OT assessed  fit, identifying no areas of potential pressure and appropriate fit. Corresponding education provided on wearing schedule and care of orthoses; pt verbalized understanding and handout administered  Ultrasound Ultrasound treatment applied to posterior forearm, along common extensor muscle mass, and lateral upper arm just proximal to elbow for 8 min to relieve pain, address lingering inflammation, and promote blood flow for tissue healing; pt denied contraindications or precautions. No adverse reaction observed or reported after application; skin intact. Parameters: 3.3 MHz, 100% duty cycle, and intensity of 0.8 W/cm2   Active Stretching Introduced active stretching for RUE per protocol: wrist flexion stretch w/ UE adducted, elbow flexed to 90 and forearm in neutral. Attempted exercise x3, holding end range about 10 sec w/ no pain, but report of soreness. OT instructed to not incorporate active stretch into HEP for another few days, only progressing when able to tolerate this first stretch w/out difficulty    PATIENT EDUCATION: Provided ongoing condition-specific education, including review of soft tissue massage, moist heat therapy, and movement precautions and considerations to avoid aggravation of pain (e.g., no repetitive pinching and pulling, grasp and lift objects w/ forearm supination, avoiding prolonged elbow extension or elbow "locking"); corresponding handouts provided Person educated: Patient Education method: Explanation Education comprehension: verbalized understanding   HOME EXERCISE PROGRAM: Soft tissue massage for ~5 min, 2x/day Moist heat for ~15 min, 2x/day   GOALS: Goals reviewed with patient? Yes  SHORT TERM GOALS: Target date:  04/24/2022       Status:  1 Pt will be independent with HEP for RUE strengthening and coordination to increase independence with ADLs and IADLs  Progressing  2 Pt will be able to utilize RUE at diminished level when opening water bottle and other  meal prep items  Progressing  3 Pt will verbalize understanding of task modifications and/or potential A/E needs to increase ease, safety, and independence w/ ADLS  Progressing  4 Pt will demonstrate improved UE functional use for ADLs as evidenced by increasing box/ blocks score by 3 blocks with RUE Baseline: R: 16 and L: 29 (in 30 seconds bilaterally) Progressing    LONG TERM GOALS: Target date:  05/22/2022       Status:  1 Pt will demonstrate increased grip strength to 20# to 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 in pain > 2 on 10 point pain scale Baseline: R: 15# and L: 45# Progressing  2 Pt will demonstrate ability to utilize RUE at dominant level when opening water bottle or other meal prep tasks without increase in pain >2 on 10 point pain scale  Progressing  3 Pt will demonstrate improved UE functional use for ADLs as evidenced by increasing box/ blocks score by 6 blocks with RUE Baseline: R: 16 and L: 29 (in 30 second time limit) Progressing  4 Pt  will report improvements in pain and functional use of dominant RUE as reflected on QuickDASH by improving score from 50% to 30% Baseline: 50% Progressing  5 Pt will report improvements in functional use of RUE to complete ADLs and IADLs without increase in pain  Progressing    ASSESSMENT:  CLINICAL IMPRESSION: OT continued to progress w/ initial phase of treatment for lateral epicondylitis, focusing on pain management and promotion of soft tissue healing. Reviewed previously discussed strategies and continued condition-specific education, answering pt questions prn. Self-purchased orthoses appear to fit well. OT also introduced ultrasound treatment, potential benefit of triceps stretch and, due to report of decreased pain, attempted first exercise in active stretching regimen. Pt did experience soreness during stretch and OT opted to defer incorporation of exercises for an additional few days w/ pt  verbalizing understanding. 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  PERFORMANCE DEFICITS in functional skills including ADLs, IADLs, coordination, dexterity, edema, ROM, strength, pain, flexibility, FMC, GMC, mobility, decreased knowledge of precautions, decreased knowledge of use of DME, and UE functional use.  IMPAIRMENTS are limiting patient from ADLs, IADLs, and work.   COMORBIDITIES may have co-morbidities  that affects occupational performance. Patient will benefit from skilled OT to address above impairments and improve overall function.   PLAN: OT FREQUENCY: 2x/week  OT DURATION: 8 weeks  PLANNED INTERVENTIONS: self care/ADL training, therapeutic exercise, therapeutic activity, manual therapy, passive range of motion, splinting, ultrasound, paraffin, fluidotherapy, compression bandaging, moist heat, cryotherapy, patient/family education, and DME and/or AE instructions  RECOMMENDED OTHER SERVICES: N/A  CONSULTED AND AGREED WITH PLAN OF CARE: Patient  PLAN FOR NEXT SESSION: Continue w/ initial pain management: soft tissue massage to posterior forearm, ultrasound, and introduce compensatory strategies/AE to avoid aggravating movements prn   Kathrine Cords, OTR/L 04/01/2022, 4:13 PM

## 2022-04-03 ENCOUNTER — Ambulatory Visit: Payer: BC Managed Care – PPO | Admitting: Occupational Therapy

## 2022-04-03 DIAGNOSIS — M79641 Pain in right hand: Secondary | ICD-10-CM | POA: Diagnosis not present

## 2022-04-03 DIAGNOSIS — M25641 Stiffness of right hand, not elsewhere classified: Secondary | ICD-10-CM

## 2022-04-03 DIAGNOSIS — M6281 Muscle weakness (generalized): Secondary | ICD-10-CM

## 2022-04-03 DIAGNOSIS — M25521 Pain in right elbow: Secondary | ICD-10-CM

## 2022-04-03 NOTE — Therapy (Signed)
OUTPATIENT OCCUPATIONAL THERAPY TREATMENT NOTE  Patient Name: Destiny Sharp MRN: [id number] DOB:[birth year], 51 y.o., female Today's Date: 04/03/2022  PCP: Vicenta Aly, [place] REFERRING PROVIDER: Sherilyn Cooter, MD    OT End of Session - 04/03/22 0854     Visit Number 4    Number of Visits 17    Date for OT Re-Evaluation 05/22/22    Authorization Type BCBS PPO    OT Start Time 0847    OT Stop Time 0930    OT Time Calculation (min) 43 min    Activity Tolerance Patient tolerated treatment well    Behavior During Therapy [facility] for tasks assessed/performed             Past Medical History:  Diagnosis Date   Allergy    Anxiety    no meds   Asthma    Bronchitis    one or two times/year   Edema    LLE   Elevated liver function tests    Fibromyalgia    Headache(784.0)    otc med prn   Hx of cardiovascular stress test 11/22/12   Lex MV 2/14: EF 81%, no ischemia   Hyperlipidemia    diet controlled - no med   Hypothyroidism    Pneumonia    hx   PONV (postoperative nausea and vomiting)    Scoliosis    history   Sleep apnea    Does not use CPAP every night   Vasovagal syncope    one episode only   Past Surgical History:  Procedure Laterality Date   ABDOMINAL HYSTERECTOMY     APPENDECTOMY     CYSTOSCOPY  05/22/2014   Procedure: CYSTOSCOPY;  Surgeon: Jamey Reas de Berton Lan, MD;  Location: [facility];  Service: Gynecology;;   MOUTH SURGERY     wisdom teeth ext   NASAL SINUS SURGERY     x 3   ROBOTIC ASSISTED LAPAROSCOPIC LYSIS OF ADHESION Bilateral 05/22/2014   Procedure: XI ROBOTIC ASSISTED LAPAROSCOPIC LYSIS OF extensive ADHESION;  Surgeon: Jamey Reas de Berton Lan, MD;  Location: [facility];  Service: Gynecology;  Laterality: Bilateral;   ROBOTIC ASSISTED TOTAL HYSTERECTOMY WITH BILATERAL SALPINGO OOPHERECTOMY N/A 05/22/2014   Procedure: ROBOTIC ASSISTED TOTAL HYSTERECTOMY WITH BILATERAL SALPINGO OOPHORECTOMY and cystoscopy;  Surgeon:  Jamey Reas de Berton Lan, MD;  Location: [facility];  Service: Gynecology;  Laterality: N/A;   Patient Active Problem List   Diagnosis Date Noted   Lateral epicondylitis, right elbow 03/24/2022   Pain in right hand 06/24/2021   Seasonal and perennial allergic rhinitis 07/07/2014   Status post laparoscopic hysterectomy 05/22/2014   Vasovagal attack 03/07/2014   Obstructive sleep apnea 01/17/2013   Abnormal CT of the chest 10/30/2012   Chest pain 10/07/2012   Hypothyroidism    Hypertension    Allergic asthma, mild intermittent, uncomplicated    Fatigue    Edema     ONSET DATE: October 2022 (referral date 01/15/2022)  REFERRING DIAG: M79.641 (ICD-10-CM) - Pain in right hand   THERAPY DIAG:  Pain in right elbow  Pain in right hand  Muscle weakness (generalized)  Stiffness of right hand, not elsewhere classified  Rationale for Evaluation and Treatment Rehabilitation  SUBJECTIVE:   SUBJECTIVE STATEMENT: Pt reports "it wears on your body" (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 Pt accompanied by: self  PERTINENT HISTORY: R hand pain, seasonal allergies, obstructive sleep apnea, hypothyroidism, HTN  PAIN: Are you having pain? 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  Pt brought in picture of school bus brake that she is supposed to pull at every stop to demonstrate location of brake.  Discussed possible modifications and supports, with pt stating they  don't want her to add or modify anything. Active stretching - wrist flexion stretch (per protocol) UE adducted, elbow flexed to 90 and forearm in neutral. Completed exercise x3, holding end range about 10 sec with no pain, but report of soreness.  Pt reports "more sore" in index and long finger than in elbow when completing exercise.  Pt attempted with hand in more open position to modify pain with decreased reports of soreness in fingers.  Pt continues to report pain and stiffness in index and long finger in R hand. Educated on tendon gliding and PROM stretch at MCP, PIP, and DIP.  Therapist providing verbal cues, demonstration, and handout for each.  Pt may benefit from further assessment and addressing hand as well. Therapist provided massage to lateral elbow in clockwise, counter clockwise, length of muscle, and perpendicular to muscle while reviewing technique and answering questions as appropriate.    PATIENT EDUCATION: Provided ongoing condition-specific education, including review of soft tissue massage, moist heat therapy, and movement precautions and considerations to avoid aggravation of pain (e.g., no repetitive pinching and pulling, grasp and lift objects w/ forearm supination, avoiding prolonged elbow extension or elbow "locking"); corresponding handouts provided Person educated: Patient Education method: Explanation Education comprehension: verbalized understanding   HOME EXERCISE PROGRAM: Soft tissue massage for ~5 min, 2x/day Moist heat for ~15 min, 2x/day Access Code: [email or web address] URL: [email or web address] Date: 04/03/2022 Prepared by: [facility]  Exercises - Tendon Glides  - 1 x daily - 5 reps - 2-3 seconds hold - Seated Finger Composite Flexion Stretch  - 1 x daily - 3-5 reps   GOALS: Goals reviewed with patient? Yes  SHORT TERM GOALS: Target date:  04/24/2022       Status:  1 Pt will be independent with HEP for RUE  strengthening and coordination to increase independence with ADLs and IADLs  Progressing  2 Pt will be able to utilize RUE at diminished level when opening water bottle and other meal prep items  Progressing  3 Pt will verbalize understanding of task modifications and/or potential A/E needs to increase ease, safety, and independence w/ ADLS  Progressing  4 Pt will demonstrate improved UE functional use for ADLs as evidenced by increasing box/ blocks score by 3 blocks with RUE Baseline: R: 16 and L: 29 (in 30 seconds bilaterally) Progressing    LONG TERM GOALS: Target date:  05/22/2022       Status:  1 Pt will demonstrate increased grip strength to 20# to 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 in pain > 2 on 10 point pain scale Baseline: R: 15# and L: 45# Progressing  2 Pt will demonstrate ability to utilize RUE at dominant level when opening water  bottle or other meal prep tasks without increase in pain >2 on 10 point pain scale  Progressing  3 Pt will demonstrate improved UE functional use for ADLs as evidenced by increasing box/ blocks score by 6 blocks with RUE Baseline: R: 16 and L: 29 (in 30 second time limit) Progressing  4 Pt will report improvements in pain and functional use of dominant RUE as reflected on QuickDASH by improving score from 50% to 30% Baseline: 50% Progressing  5 Pt will report improvements in functional use of RUE to complete ADLs and IADLs without increase in pain  Progressing    ASSESSMENT:  CLINICAL IMPRESSION: OT continued to progress w/ initial phase of treatment for lateral epicondylitis, focusing on pain management and promotion of soft tissue healing. Reviewed previously discussed strategies and continued condition-specific education, answering pt questions prn. Therapist completed manual massage and reviewed technique with referencing handouts per protocol.  Reviewed first exercise in active stretching regimen with pt  reporting less soreness in elbow but soreness in previously and currently painful hand.  Pt to attempt as long as no soreness or pain in elbow during activity with modified hand position. 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  PERFORMANCE DEFICITS in functional skills including ADLs, IADLs, coordination, dexterity, edema, ROM, strength, pain, flexibility, FMC, GMC, mobility, decreased knowledge of precautions, decreased knowledge of use of DME, and UE functional use.  IMPAIRMENTS are limiting patient from ADLs, IADLs, and work.   COMORBIDITIES may have co-morbidities  that affects occupational performance. Patient will benefit from skilled OT to address above impairments and improve overall function.   PLAN: OT FREQUENCY: 2x/week  OT DURATION: 8 weeks  PLANNED INTERVENTIONS: self care/ADL training, therapeutic exercise, therapeutic activity, manual therapy, passive range of motion, splinting, ultrasound, paraffin, fluidotherapy, compression bandaging, moist heat, cryotherapy, patient/family education, and DME and/or AE instructions  RECOMMENDED OTHER SERVICES: N/A  CONSULTED AND AGREED WITH PLAN OF CARE: Patient  PLAN FOR NEXT SESSION: Continue w/ initial pain management: soft tissue massage to posterior forearm, ultrasound, and introduce compensatory strategies/AE to avoid aggravating movements prn   [place], OTR/L 04/03/2022, 8:55 AM

## 2022-04-08 ENCOUNTER — Encounter: Payer: Self-pay | Admitting: Occupational Therapy

## 2022-04-08 ENCOUNTER — Ambulatory Visit: Payer: BC Managed Care – PPO | Attending: Orthopedic Surgery | Admitting: Occupational Therapy

## 2022-04-08 DIAGNOSIS — M25641 Stiffness of right hand, not elsewhere classified: Secondary | ICD-10-CM | POA: Diagnosis present

## 2022-04-08 DIAGNOSIS — M25521 Pain in right elbow: Secondary | ICD-10-CM | POA: Insufficient documentation

## 2022-04-08 DIAGNOSIS — M6281 Muscle weakness (generalized): Secondary | ICD-10-CM | POA: Diagnosis present

## 2022-04-08 DIAGNOSIS — M79641 Pain in right hand: Secondary | ICD-10-CM | POA: Insufficient documentation

## 2022-04-08 NOTE — Therapy (Unsigned)
OUTPATIENT OCCUPATIONAL THERAPY TREATMENT NOTE  Patient Name: Destiny Sharp MRN: [id number] DOB:1971-04-29, 51 y.o., female Today's Date: 04/08/2022  PCP: Vicenta Aly, [place] REFERRING PROVIDER: Sherilyn Cooter, MD    OT End of Session - 04/08/22 1458     Visit Number 5    Number of Visits 17    Date for OT Re-Evaluation 05/22/22    Authorization Type BC[employer] PPO    OT Start Time 1455    OT Stop Time 1535    OT Time Calculation (min) 40 min    Activity Tolerance Patient tolerated treatment well    Behavior During Therapy W[facility] for tasks assessed/performed            Past Medical History:  Diagnosis Date   Allergy    Anxiety    no meds   Asthma    Bronchitis    one or two times/year   Edema    LLE   Elevated liver function tests    Fibromyalgia    Headache(784.0)    otc med prn   Hx of cardiovascular stress test 11/22/12   Lex MV 2/14: EF 81%, no ischemia   Hyperlipidemia    diet controlled - no med   Hypothyroidism    Pneumonia    hx   PONV (postoperative nausea and vomiting)    Scoliosis    history   Sleep apnea    Does not use CPAP every night   Vasovagal syncope    one episode only   Past Surgical History:  Procedure Laterality Date   ABDOMINAL HYSTERECTOMY     APPENDECTOMY     CYSTOSCOPY  05/22/2014   Procedure: CYSTOSCOPY;  Surgeon: Jamey Reas de Berton Lan, MD;  Location: [facility];  Service: Gynecology;;   MOUTH SURGERY     wisdom teeth ext   NASAL SINUS SURGERY     x 3   ROBOTIC ASSISTED LAPAROSCOPIC LYSIS OF ADHESION Bilateral 05/22/2014   Procedure: XI ROBOTIC ASSISTED LAPAROSCOPIC LYSIS OF extensive ADHESION;  Surgeon: Jamey Reas de Berton Lan, MD;  Location: [facility];  Service: Gynecology;  Laterality: Bilateral;   ROBOTIC ASSISTED TOTAL HYSTERECTOMY WITH BILATERAL SALPINGO OOPHERECTOMY N/A 05/22/2014   Procedure: ROBOTIC ASSISTED TOTAL HYSTERECTOMY WITH BILATERAL SALPINGO OOPHORECTOMY and cystoscopy;  Surgeon: Jamey Reas de Berton Lan, MD;  Location: [facility];  Service: Gynecology;  Laterality: N/A;   Patient Active Problem List   Diagnosis Date Noted   Lateral epicondylitis, right elbow 03/24/2022   Pain in right hand 06/24/2021   Seasonal and perennial allergic rhinitis 07/07/2014   Status post laparoscopic hysterectomy 05/22/2014   Vasovagal attack 03/07/2014   Obstructive sleep apnea 01/17/2013   Abnormal CT of the chest 10/30/2012   Chest pain 10/07/2012   Hypothyroidism    Hypertension    Allergic asthma, mild intermittent, uncomplicated    Fatigue    Edema     ONSET DATE: October 2022 (referral date 01/15/2022)  REFERRING DIAG: M79.641 (ICD-10-CM) - Pain in right hand   THERAPY DIAG:  Pain in right elbow  Pain in right hand  Muscle weakness (generalized)  Stiffness of right hand, not elsewhere classified  Rationale for Evaluation and Treatment Rehabilitation  SUBJECTIVE:   SUBJECTIVE STATEMENT: "It's sore today" Pt accompanied by: self  PERTINENT HISTORY: R hand pain, seasonal allergies, obstructive sleep apnea, hypothyroidism, HTN  PAIN: Are you having pain? Yes: NPRS scale: 4/10 Pain location: R elbow Pain description: shooting from elbow to  hand and elbow to shoulder Aggravating factors: movement in certain directions or pulling on anything Relieving factors: rest, ice/heat  LIVING ENVIRONMENT: Lives with: lives with their family and is caretaker for adult father  PLOF: Independent  PATIENT GOALS: to regain use of hand, specifically to open things  OBJECTIVE:   HAND DOMINANCE: Right   TODAY'S TREATMENT - 04/08/22  Edema Massage Gentle retrograde massage for R hand completed from fingers down toward wrist/forearm for edema management for 8 min; OT used lotion to increase mobility and decrease friction. Pt positioned w/ forearm resting on tabletop and wrist/hand in neutral; tolerated intervention w/out discomfort.  PROM Attempted gentle PROM of  composite finger flex/ext, but pt was unable to tolerate even mid-range ROM w/out significant pain, particularly of R index finger MCPJ; activity was d/c due to pain  Pain & Edema Management Thorough education provided regarding pain and edema management strategies for hand pain, including: compression gloves (handout administered for self-purchase/option to discuss acquiring gloves from her physician), cold packs/ice, edema massage, gentle hand abd/add, and elevation; pt returned demonstration/verbalized understanding of all strategies discussed  Adaptive Equipment Education on adaptive, ergonomic jar/bottle opener according to movement precautions and considerations 2/2 both R index finger pain and R lateral epicondylitis; pt verbalized understanding w/ handout provided for self-purchase option    PATIENT EDUCATION: Ongoing condition-specific education; see tx section above Person educated: Patient Education method: Explanation Education comprehension: verbalized understanding   HOME EXERCISE PROGRAM: Soft tissue massage for ~5 min, 2x/day Moist heat for ~15 min, 2x/day Access Code: [email or web address] Tendon Glides  - 1 x daily - 5 reps - 2-3 seconds hold Seated Finger Composite Flexion Stretch  - 1 x daily - 3-5 reps   GOALS: Goals reviewed with patient? Yes  SHORT TERM GOALS: Target date:  04/24/2022       Status:  1 Pt will be independent with HEP for RUE strengthening and coordination to increase independence with ADLs and IADLs  Progressing  2 Pt will be able to utilize RUE at diminished level when opening water bottle and other meal prep items  Progressing  3 Pt will verbalize understanding of task modifications and/or potential A/E needs to increase ease, safety, and independence w/ ADLS  Progressing  4 Pt will demonstrate improved UE functional use for ADLs as evidenced by increasing box/ blocks score by 3 blocks with RUE Baseline: R: 16 and L: 29 (in 30 seconds bilaterally)  Progressing    LONG TERM GOALS: Target date:  05/22/2022       Status:  1 Pt will demonstrate increased grip strength to 20# to 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 in pain > 2 on 10 point pain scale Baseline: R: 15# and L: 45# Progressing  2 Pt will demonstrate ability to utilize RUE at dominant level when opening water bottle or other meal prep tasks without increase in pain >2 on 10 point pain scale  Progressing  3 Pt will demonstrate improved UE functional use for ADLs as evidenced by increasing box/ blocks score by 6 blocks with RUE Baseline: R: 16 and L: 29 (in 30 second time limit) Progressing  4 Pt will report improvements in pain and functional use of dominant RUE as reflected on QuickDASH by improving score from 50% to 30% Baseline: 50% Progressing  5 Pt will report improvements in functional use of RUE to complete ADLs and IADLs without increase in pain  Progressing    ASSESSMENT:  CLINICAL IMPRESSION: Pt  arrived to session today notably guarding her RUE w/ both self-purchased wrist immobilizer and counterforce brace on. Pt apparently re-aggravated symptoms taking her dog to the vet. Increased swelling of R hand noted as well. Due to this, OT completed retrograde massage, providing corresponding education, and then attempted gentle passive movement of digits. Pt was unable to tolerate w/out significant increase in pain (from 4/10 to 8/10 w/ noticeable grimacing and some pulling away), so OT focused remaining session on reviewing movement precautions and considerations to avoid aggravation of pain (e.g., no repetitive pinching and pulling, grasp and lift objects w/ forearm supination, avoiding prolonged elbow extension or elbow "locking") related to both lateral epicondylitis and increase R hand pain. OT also introduced conservative intervention for pain and swelling management, encouraging pt to defer back to her physician w/ any significant  increase in pain, worsening symptoms, or no improvement w/ incorporation of strategies at home. Pt verbalized understanding and is motivated to improve and be able to return to work at the end of this month.  PERFORMANCE DEFICITS in functional skills including ADLs, IADLs, coordination, dexterity, edema, ROM, strength, pain, flexibility, FMC, GMC, mobility, decreased knowledge of precautions, decreased knowledge of use of DME, and UE functional use.  IMPAIRMENTS are limiting patient from ADLs, IADLs, and work.   COMORBIDITIES may have co-morbidities  that affects occupational performance. Patient will benefit from skilled OT to address above impairments and improve overall function.   PLAN: OT FREQUENCY: 2x/week  OT DURATION: 8 weeks  PLANNED INTERVENTIONS: self care/ADL training, therapeutic exercise, therapeutic activity, manual therapy, passive range of motion, splinting, ultrasound, paraffin, fluidotherapy, compression bandaging, moist heat, cryotherapy, patient/family education, and DME and/or AE instructions  RECOMMENDED OTHER SERVICES: N/A  CONSULTED AND AGREED WITH PLAN OF CARE: Patient  PLAN FOR NEXT SESSION: Continue w/ initial pain management: soft tissue massage to posterior forearm, ultrasound, and introduce compensatory strategies/AE to avoid aggravating movements prn   Kathrine Cords, OTR/L 04/09/2022, 8:20 AM

## 2022-04-08 NOTE — Progress Notes (Signed)
HPI Allergy skin test 03/07/14- significant positive intradermal tests female never smoker followed for Asthma, episodic dyspnea, restrictive PFT, hyperventilation, OSA, anxiety, history abnormal CT Office spirometry 10/27/12- FVC2.32/ 71%, FEV1 2.02/ 73%, FEV1/FVC 0.87, FEF 25-75% 94% NPSG- 12/15/10- Dr Jay[facility]- RDI 6.8/ hr. CPAP 10, [facility] DME, full face mask, humidifier ECHO 5/36/14- Gr 2 diastolic dysfunction/CHF, no PHTN PFT 02/03/13- severe restriction of total lung capacity, normal spirometry flows for volume with insignificant response to bronchodilator, diffusion moderately reduced. TLC 49%, DLCO 62%. FVC 2.19/62%, FEV1 1.95/68%, FEV1/FVC 89/108%, FEF 25-75% 88/104%. 6 minute walk test 02/03/2013-98%, 99%, 99%, 303 m. Methacholine inhalation challenge test 01/27/2013 positive for hyperreactive airways at stage I.0 female never smoker followed for Asthma, episodic dyspnea, restrictive PFT, hyperventilation, OSA, anxiety, history abnormal CT ABG 10/12/12 (? FiO2), pH 7.609, PCO2 20.3, PO2 122, HCO3 20.5- consistent w/ acute respiratory alkalosis. -----------------------------------------------------.   10/28/20-  51 year old female never smoker followed for Asthma, episodic dyspnea, restrictive PFT, hyperventilation, OSA, anxiety, history abnormal CT, HBP, DM2 Neb Duoneb, Ventolin hfa, Breo 100,  CPAP 10/ [facility] >> replace old machine auto 5-15 Download- Body weight today-164 lbs Covid vax-3 Phizer Flu vax-had ED in October for L leg pain and swelling, rx'd as cellulitis after neg Doppler. -----Patient states that about a month ago she broke out in a rash and is not sure why other than she bought a new mattress. Did not affect her breathing or asthma Rash resolved, nonspecific. If it recurs she can go to allergist. Asthma has been well controlled w/o exacerbation or need for rescue inhaler recently She is back driving school bus full time.  Has recognized benefit  from CPAP with improved sleep and alertness. Stopped using CPAP when leaking mask attachment began to whistle. DOT wants to see documentation she is using it, so needs replacement. Machine is old. Considering changing DME from [facility] since they moved to [place].  04/10/22-  51 year old female never smoker followed for Asthma, episodic dyspnea, restrictive PFT, hyperventilation, OSA, anxiety, history abnormal CT, HBP, DM2 Neb Duoneb, Ventolin hfa, Breo 100,  CPAP auto 5-15/ [facility] Download- Body weight today 165 lbs Covid vax-3 Phizer -----She has not been using her CPAP and sleep is about the same.  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  On review, she had really minimal sleep apnea in 2012 and has lost some weight since then.  Breathing is otherwise comfortable.  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  This was an issue in the past.  ROS-see HPI     + = positive Constitutional:   No-   weight loss, night sweats, fevers, chills, +fatigue, lassitude. HEENT:   No-  headaches, difficulty swallowing, tooth/dental problems, sore throat,       itching, no-ear ache, nasal congestion, +post nasal drip,  CV:  No-chest pain/ pressure, no-orthopnea, PND, swelling in lower extremities, anasarca,                                               dizziness, palpitations Resp: +  shortness of breath with exertion or at rest.              No-   productive cough,  No non-productive cough,  No- coughing up of blood.  No-   change in color of mucus.  No- wheezing.   Skin: No-   rash or lesions. GI:  No-   heartburn, indigestion, abdominal pain, nausea, vomiting, GU:  MS:  No-   joint pain or swelling. + back pain Neuro-     nothing unusual Psych:  No- change in mood or affect. No depression + anxiety.  No memory loss.  OBJ- Physical Exam   General- Alert, Oriented, Affect- calm, + overweight Skin-  clear Lymphadenopathy- none Head- atraumatic            Eyes- Gross vision intact, PERRLA, conjunctivae and secretions clear            Ears- Hearing, canals-normal            Nose- Clear, no-Septal dev, mucus, polyps, erosion, perforation             Throat- Mallampati III , mucosa clear , drainage- none, tonsils- atrophic Neck- flexible , trachea midline, no stridor , thyroid nl, carotid no bruit Chest - symmetrical excursion , unlabored           Heart/CV- RRR , no murmur , no gallop  , no rub, nl s1 s2                           - JVD- none , edema- none, stasis changes- none, varices- none           Lung- clear to P&A, wheeze- none, cough- none , dullness-none, rub- none.           Chest wall-  Abd-  Br/ Gen/ Rectal- Not done, not indicated Extrem-+ orthopedic brace right forearm (pulled by dog) Neuro- grossly intact to observation

## 2022-04-10 ENCOUNTER — Ambulatory Visit (INDEPENDENT_AMBULATORY_CARE_PROVIDER_SITE_OTHER): Payer: BC Managed Care – PPO | Admitting: Internal Medicine

## 2022-04-10 ENCOUNTER — Encounter: Payer: Self-pay | Admitting: Internal Medicine

## 2022-04-10 ENCOUNTER — Ambulatory Visit (INDEPENDENT_AMBULATORY_CARE_PROVIDER_SITE_OTHER): Payer: BC Managed Care – PPO | Admitting: Orthopedic Surgery

## 2022-04-10 ENCOUNTER — Encounter: Payer: Self-pay | Admitting: Orthopedic Surgery

## 2022-04-10 VITALS — BP 118/76 | HR 76 | Temp 97.9°F | Ht 62.0 in | Wt 165.6 lb

## 2022-04-10 DIAGNOSIS — M7711 Lateral epicondylitis, right elbow: Secondary | ICD-10-CM

## 2022-04-10 DIAGNOSIS — J4520 Mild intermittent asthma, uncomplicated: Secondary | ICD-10-CM

## 2022-04-10 DIAGNOSIS — M79641 Pain in right hand: Secondary | ICD-10-CM

## 2022-04-10 DIAGNOSIS — G4733 Obstructive sleep apnea (adult) (pediatric): Secondary | ICD-10-CM

## 2022-04-10 MED ORDER — PREDNISONE 10 MG (21) PO TBPK: ORAL_TABLET | ORAL | 0 refills | Status: DC

## 2022-04-10 NOTE — Assessment & Plan Note (Signed)
Uncomplicated now without cough or wheeze.  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

## 2022-04-10 NOTE — Assessment & Plan Note (Signed)
Not at all clear that she still benefits from CPAP. Plan-update home sleep test

## 2022-04-10 NOTE — Progress Notes (Signed)
Office Visit Note   Patient: Destiny Sharp           Date of Birth: 1970-11-24           MRN: [id number] Visit Date: 04/10/2022              Requested by: Vicenta Aly, [address],  [place] [id number] PCP: Vicenta Aly, FNP   Assessment & Plan: Visit Diagnoses:  1. Lateral epicondylitis, right elbow   2. Pain in right hand     Plan: Patient continues to complain of significant pain at the lateral aspect of the right elbow as well as in the right hand.  She was doing little better with therapy in terms of the elbow pain but unfortunately her dog jumped on her elbow and hand earlier this week.  She continues to have significant pain in the right hand mostly at the index finger MCP joint with limited range of motion secondary to pain and swelling.  This has been a recurrent issue for her.  I will get an MRI of her right hand to further evaluate her symptoms as her x-rays are largely unremarkable with well-maintained joint space at the symptomatic MCP joints.  I will also try a steroid Dosepak as this gave her significant relief when she first presented back in April.  I can see her back once the MRI is completed.  Follow-Up Instructions: No follow-ups on file.   Orders:  No orders of the defined types were placed in this encounter.  No orders of the defined types were placed in this encounter.     Procedures: No procedures performed   Clinical Data: No additional findings.   Subjective: Chief Complaint  Patient presents with   Right Elbow - Pain, Follow-up   Right Hand - Pain, Follow-up    This is a 52 year old right-hand-dominant female who works as a International aid/development worker who presents with continued pain in the right elbow and right hand.  The right hand issue is been going on for some time.  I saw her initially back in April at which time her symptoms significantly improved following a Medrol Dosepak.  She continues to complain of pain in  the right hand that is worse at the index finger MCP joint.  She is unable make a fist secondary to pain and swelling of all of her fingers.  She also has pain at the lateral aspect of the right elbow.  She has been in therapy and notes that her elbow pain is overall improved since starting therapy.  Her symptoms acutely worsened this week when on Wednesday her dog jumped on her right elbow and her right hand.  She forcibly pushed the dog away with this extremity causing significant pain.  She does not have a history of inflammatory arthropathy that she knows of, however, she was worked up in the past following atraumatic swelling and pain in both knees.  She has no family history of rheumatoid or other inflammatory arthritis.  She has no history of gout that she is aware of.    Review of Systems   Objective: Vital Signs: LMP 05/22/2014 (Exact Date)   Physical Exam  Right Hand Exam   Tenderness  Right hand tenderness location: TTP along index finger, worse at MCPJ.  Other  Erythema: absent Pulse: present  Comments:  Mild to moderate swelling of all fingers.  Limited ROM of fingers secondary to pain, swelling, and stiffness.  Worse w/  IF at MCPJ.  No pain w/ ROM of wrist.    Right Elbow Exam   Tenderness  The patient is experiencing tenderness in the lateral epicondyle.   Range of Motion  The patient has normal right elbow ROM.  Other  Erythema: absent Sensation: normal Pulse: present  Comments:  Reproduction of her pain with resisted middle finger and wrist extension with elbow extension.       Specialty Comments:  No specialty comments available.  Imaging: No results found.   PMFS History: Patient Active Problem List   Diagnosis Date Noted   Lateral epicondylitis, right elbow 03/24/2022   Pain in right hand 06/24/2021   Seasonal and perennial allergic rhinitis 07/07/2014   Status post laparoscopic hysterectomy 05/22/2014   Vasovagal attack 03/07/2014    Obstructive sleep apnea 01/17/2013   Abnormal CT of the chest 10/30/2012   Chest pain 10/07/2012   Hypothyroidism    Hypertension    Allergic asthma, mild intermittent, uncomplicated    Fatigue    Edema    Past Medical History:  Diagnosis Date   Allergy    Anxiety    no meds   Asthma    Bronchitis    one or two times/year   Edema    LLE   Elevated liver function tests    Fibromyalgia    Headache(784.0)    otc med prn   Hx of cardiovascular stress test 11/22/12   Lex MV 2/14: EF 81%, no ischemia   Hyperlipidemia    diet controlled - no med   Hypothyroidism    Pneumonia    hx   PONV (postoperative nausea and vomiting)    Scoliosis    history   Sleep apnea    Does not use CPAP every night   Vasovagal syncope    one episode only    Family History  Problem Relation Age of Onset   CAD Maternal Grandmother    Ovarian cancer Mother 48       ovarian   Heart disease Maternal Grandfather    Heart disease Paternal Grandfather    Heart disease Paternal Grandmother    Heart disease Father    Breast cancer Maternal Aunt    Emphysema Maternal Uncle     Past Surgical History:  Procedure Laterality Date   ABDOMINAL HYSTERECTOMY     APPENDECTOMY     CYSTOSCOPY  05/22/2014   Procedure: CYSTOSCOPY;  Surgeon: Everardo All Amundson de Berton Lan, MD;  Location: [facility];  Service: Gynecology;;   MOUTH SURGERY     wisdom teeth ext   NASAL SINUS SURGERY     x 3   ROBOTIC ASSISTED LAPAROSCOPIC LYSIS OF ADHESION Bilateral 05/22/2014   Procedure: XI ROBOTIC ASSISTED LAPAROSCOPIC LYSIS OF extensive ADHESION;  Surgeon: Jamey Reas de Berton Lan, MD;  Location: [facility];  Service: Gynecology;  Laterality: Bilateral;   ROBOTIC ASSISTED TOTAL HYSTERECTOMY WITH BILATERAL SALPINGO OOPHERECTOMY N/A 05/22/2014   Procedure: ROBOTIC ASSISTED TOTAL HYSTERECTOMY WITH BILATERAL SALPINGO OOPHORECTOMY and cystoscopy;  Surgeon: Jamey Reas de Berton Lan, MD;  Location: [facility];   Service: Gynecology;  Laterality: N/A;   Social History   Occupational History    Employer: [employer]    Comment: [employer] School [employer]  Tobacco Use   Smoking status: Never   Smokeless tobacco: Never  Vaping Use   Vaping Use: Never used  Substance and Sexual Activity   Alcohol use: No    Alcohol/week: 0.0  standard drinks of alcohol   Drug use: No   Sexual activity: Never    Partners: Male    Birth control/protection: Surgical    Comment: R-TLH/BSO

## 2022-04-10 NOTE — Patient Instructions (Signed)
Order- schedule Home Sleep Test   dx OSA  Please call us for results and recommendations 2 seeks after your sleep test

## 2022-04-13 ENCOUNTER — Ambulatory Visit: Payer: BC Managed Care – PPO | Admitting: Occupational Therapy

## 2022-04-13 DIAGNOSIS — M25641 Stiffness of right hand, not elsewhere classified: Secondary | ICD-10-CM

## 2022-04-13 DIAGNOSIS — M25521 Pain in right elbow: Secondary | ICD-10-CM | POA: Diagnosis not present

## 2022-04-13 DIAGNOSIS — M79641 Pain in right hand: Secondary | ICD-10-CM

## 2022-04-13 DIAGNOSIS — M6281 Muscle weakness (generalized): Secondary | ICD-10-CM

## 2022-04-13 NOTE — Therapy (Signed)
OUTPATIENT OCCUPATIONAL THERAPY TREATMENT NOTE  Patient Name: Destiny Sharp MRN: [id number] DOB:1971-08-05, 51 y.o., female Today's Date: 04/13/2022  PCP: Vicenta Aly, FNP REFERRING PROVIDER: Sherilyn Cooter, MD    OT End of Session - 04/13/22 1409     Visit Number 6    Number of Visits 17    Date for OT Re-Evaluation 05/22/22    Authorization Type BCBS PPO    OT Start Time 1406    OT Stop Time 1446    OT Time Calculation (min) 40 min    Activity Tolerance Patient tolerated treatment well    Behavior During Therapy WFL for tasks assessed/performed             Past Medical History:  Diagnosis Date   Allergy    Anxiety    no meds   Asthma    Bronchitis    one or two times/year   Edema    LLE   Elevated liver function tests    Fibromyalgia    Headache(784.0)    otc med prn   Hx of cardiovascular stress test 11/22/12   Lex MV 2/14: EF 81%, no ischemia   Hyperlipidemia    diet controlled - no med   Hypothyroidism    Pneumonia    hx   PONV (postoperative nausea and vomiting)    Scoliosis    history   Sleep apnea    Does not use CPAP every night   Vasovagal syncope    one episode only   Past Surgical History:  Procedure Laterality Date   ABDOMINAL HYSTERECTOMY     APPENDECTOMY     CYSTOSCOPY  05/22/2014   Procedure: CYSTOSCOPY;  Surgeon: Jamey Reas de Berton Lan, MD;  Location: [facility];  Service: Gynecology;;   MOUTH SURGERY     wisdom teeth ext   NASAL SINUS SURGERY     x 3   ROBOTIC ASSISTED LAPAROSCOPIC LYSIS OF ADHESION Bilateral 05/22/2014   Procedure: XI ROBOTIC ASSISTED LAPAROSCOPIC LYSIS OF extensive ADHESION;  Surgeon: Jamey Reas de Berton Lan, MD;  Location: [facility];  Service: Gynecology;  Laterality: Bilateral;   ROBOTIC ASSISTED TOTAL HYSTERECTOMY WITH BILATERAL SALPINGO OOPHERECTOMY N/A 05/22/2014   Procedure: ROBOTIC ASSISTED TOTAL HYSTERECTOMY WITH BILATERAL SALPINGO OOPHORECTOMY and cystoscopy;  Surgeon:  Jamey Reas de Berton Lan, MD;  Location: [facility];  Service: Gynecology;  Laterality: N/A;   Patient Active Problem List   Diagnosis Date Noted   Lateral epicondylitis, right elbow 03/24/2022   Pain in right hand 06/24/2021   Seasonal and perennial allergic rhinitis 07/07/2014   Status post laparoscopic hysterectomy 05/22/2014   Vasovagal attack 03/07/2014   Obstructive sleep apnea 01/17/2013   Abnormal CT of the chest 10/30/2012   Chest pain 10/07/2012   Hypothyroidism    Hypertension    Allergic asthma, mild intermittent, uncomplicated    Fatigue    Edema     ONSET DATE: October 2022 (referral date 01/15/2022)  REFERRING DIAG: M79.641 (ICD-10-CM) - Pain in right hand   THERAPY DIAG:  Pain in right elbow  Pain in right hand  Muscle weakness (generalized)  Stiffness of right hand, not elsewhere classified  Rationale for Evaluation and Treatment Rehabilitation  SUBJECTIVE:   SUBJECTIVE STATEMENT: Pt reports "it's a little bit better today". Pt accompanied by: self  PERTINENT HISTORY: R hand pain, seasonal allergies, obstructive sleep apnea, hypothyroidism, HTN  PAIN: Are you having pain? 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 No adverse reaction observed or reported  after application; skin intact. Parameters: 3.3 MHz, 100% duty cycle, and intensity of 0.8 W/cm2   Pain & Edema Massage Gentle retrograde massage for R hand completed from fingers down toward wrist/forearm for edema management for 10 min; OT used lotion to increase mobility and decrease friction. Pt positioned w/ forearm resting on tabletop and wrist/hand in neutral; tolerated intervention w/out discomfort.  Therapist reiterated use of cold packs/ice, edema massage, gentle hand abd/add, and elevation for pain and edema.  PROM Attempted gentle AROM and PROM of composite finger flex/ext.  Pt demonstrating improved flexion of all digits, except index. Pt was unable to tolerate even mid-range ROM without significant pain, particularly of R index finger MCP J    PATIENT EDUCATION: Ongoing condition-specific education; see tx section above Person educated: Patient Education method: Explanation Education comprehension: verbalized understanding   HOME EXERCISE PROGRAM: Soft tissue massage for ~5 min, 2x/day Moist heat for ~15 min, 2x/day Access Code: [email or web address] Tendon Glides  - 1 x daily - 5 reps - 2-3 seconds hold Seated Finger Composite Flexion Stretch  - 1 x daily - 3-5 reps   GOALS: Goals reviewed with patient? Yes  SHORT TERM GOALS: Target date:  04/24/2022       Status:  1 Pt will be independent with HEP for RUE strengthening and coordination to increase independence with ADLs and IADLs  Progressing  2 Pt will be able to utilize RUE at diminished level when opening water bottle and other meal prep items  Progressing  3 Pt will verbalize understanding of task modifications and/or potential A/E needs to increase ease, safety, and independence w/ ADLS  Progressing  4 Pt will demonstrate improved UE functional use for ADLs as evidenced by increasing box/ blocks score by 3 blocks with RUE Baseline: R: 16 and L: 29 (in 30 seconds bilaterally) Progressing    LONG TERM GOALS: Target date:   05/22/2022       Status:  1 Pt will demonstrate increased grip strength to 20# to 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 in pain > 2 on 10 point pain scale Baseline: R: 15# and L: 45# Progressing  2 Pt will demonstrate ability to utilize RUE at dominant level when opening water bottle or other meal prep tasks without increase in pain >2 on 10 point pain scale  Progressing  3 Pt will demonstrate improved UE functional use for ADLs as evidenced by increasing box/ blocks score by 6 blocks with RUE Baseline: R: 16 and L: 29 (in 30 second time limit) Progressing  4 Pt will report improvements in pain and functional use of dominant RUE as reflected on QuickDASH by improving score from 50% to 30% Baseline: 50% Progressing  5 Pt will report improvements in functional use of RUE to complete ADLs and IADLs without increase in pain  Progressing    ASSESSMENT:  CLINICAL IMPRESSION: Treatment session with focus on use of ultrasound for edema and pain management, retrograde massage, and then attempted gentle passive movement of digits. Pt was unable to tolerate R index finger flexion at MCP and PIP J without significant increase in pain (from 4/10 to 8/10 w/ noticeable grimacing and some pulling away), so OT focused remaining session on reviewing movement precautions and adaptive equipment to increase independence with opening containers. Pt reports plan to have MRI to further evaluate her symptoms as her x-rays are largely unremarkable with well-maintained joint space at the symptomatic MCP joints.  Pt verbalizes frustration with ongoing pain and wants answers and relief. Pt is motivated to improve and be able to return to work at the end of this month.  PERFORMANCE DEFICITS in functional skills including ADLs, IADLs, coordination, dexterity, edema, ROM, strength, pain, flexibility, FMC, GMC, mobility, decreased knowledge of precautions, decreased knowledge of use of DME,  and UE functional use.  IMPAIRMENTS are limiting patient from ADLs, IADLs, and work.   COMORBIDITIES may have co-morbidities  that affects occupational performance. Patient will benefit from skilled OT to address above impairments and improve overall function.   PLAN: OT FREQUENCY: 2x/week  OT DURATION: 8 weeks  PLANNED INTERVENTIONS: self care/ADL training, therapeutic exercise, therapeutic activity, manual therapy, passive range of motion, splinting, ultrasound, paraffin, fluidotherapy, compression bandaging, moist heat, cryotherapy, patient/family education, and DME and/or AE instructions  RECOMMENDED OTHER SERVICES: N/A  CONSULTED AND AGREED WITH PLAN OF CARE: Patient  PLAN FOR NEXT SESSION: Continue w/ initial pain management: soft tissue massage to posterior forearm, ultrasound, and introduce compensatory strategies/AE to avoid aggravating movements prn   Zandria Woldt, OTR/L 04/13/2022, 2:09 PM

## 2022-04-15 ENCOUNTER — Ambulatory Visit: Payer: BC Managed Care – PPO | Admitting: Occupational Therapy

## 2022-04-15 DIAGNOSIS — M25521 Pain in right elbow: Secondary | ICD-10-CM | POA: Diagnosis not present

## 2022-04-15 DIAGNOSIS — M25641 Stiffness of right hand, not elsewhere classified: Secondary | ICD-10-CM

## 2022-04-15 DIAGNOSIS — M6281 Muscle weakness (generalized): Secondary | ICD-10-CM

## 2022-04-15 DIAGNOSIS — M79641 Pain in right hand: Secondary | ICD-10-CM

## 2022-04-15 NOTE — Therapy (Signed)
OUTPATIENT OCCUPATIONAL THERAPY TREATMENT NOTE  Patient Name: Destiny Sharp MRN: [id number] DOB:1970-12-07, 51 y.o., female Today's Date: 04/15/2022  PCP: Vicenta Aly, [place] REFERRING PROVIDER: Sherilyn Cooter, MD    OT End of Session - 04/15/22 1032     Visit Number 7    Number of Visits 17    Date for OT Re-Evaluation 05/22/22    Authorization Type BCBS PPO    OT Start Time [phone]    OT Stop Time 1021    OT Time Calculation (min) 42 min    Activity Tolerance Patient tolerated treatment well    Behavior During Therapy [facility] for tasks assessed/performed              Past Medical History:  Diagnosis Date   Allergy    Anxiety    no meds   Asthma    Bronchitis    one or two times/year   Edema    LLE   Elevated liver function tests    Fibromyalgia    Headache(784.0)    otc med prn   Hx of cardiovascular stress test 11/22/12   Lex MV 2/14: EF 81%, no ischemia   Hyperlipidemia    diet controlled - no med   Hypothyroidism    Pneumonia    hx   PONV (postoperative nausea and vomiting)    Scoliosis    history   Sleep apnea    Does not use CPAP every night   Vasovagal syncope    one episode only   Past Surgical History:  Procedure Laterality Date   ABDOMINAL HYSTERECTOMY     APPENDECTOMY     CYSTOSCOPY  05/22/2014   Procedure: CYSTOSCOPY;  Surgeon: Jamey Reas de Berton Lan, MD;  Location: [facility];  Service: Gynecology;;   MOUTH SURGERY     wisdom teeth ext   NASAL SINUS SURGERY     x 3   ROBOTIC ASSISTED LAPAROSCOPIC LYSIS OF ADHESION Bilateral 05/22/2014   Procedure: XI ROBOTIC ASSISTED LAPAROSCOPIC LYSIS OF extensive ADHESION;  Surgeon: Jamey Reas de Berton Lan, MD;  Location: [facility];  Service: Gynecology;  Laterality: Bilateral;   ROBOTIC ASSISTED TOTAL HYSTERECTOMY WITH BILATERAL SALPINGO OOPHERECTOMY N/A 05/22/2014   Procedure: ROBOTIC ASSISTED TOTAL HYSTERECTOMY WITH BILATERAL SALPINGO OOPHORECTOMY and cystoscopy;  Surgeon:  Jamey Reas de Berton Lan, MD;  Location: [facility];  Service: Gynecology;  Laterality: N/A;   Patient Active Problem List   Diagnosis Date Noted   Lateral epicondylitis, right elbow 03/24/2022   Pain in right hand 06/24/2021   Seasonal and perennial allergic rhinitis 07/07/2014   Status post laparoscopic hysterectomy 05/22/2014   Vasovagal attack 03/07/2014   Obstructive sleep apnea 01/17/2013   Abnormal CT of the chest 10/30/2012   Chest pain 10/07/2012   Hypothyroidism    Hypertension    Allergic asthma, mild intermittent, uncomplicated    Fatigue    Edema     ONSET DATE: October 2022 (referral date 01/15/2022)  REFERRING DIAG: M79.641 (ICD-10-CM) - Pain in right hand   THERAPY DIAG:  Pain in right elbow  Pain in right hand  Muscle weakness (generalized)  Stiffness of right hand, not elsewhere classified  Rationale for Evaluation and Treatment Rehabilitation  SUBJECTIVE:   SUBJECTIVE STATEMENT: Pt reports that her index finger around PIP J was really hurting with shooting pain, but has settled down this morning. Pt accompanied by: self  PERTINENT HISTORY: R hand pain, seasonal allergies, obstructive sleep apnea, hypothyroidism, HTN  PAIN: Are you having pain? 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  Therapist educated on use of built up handle with demonstration of grasping technique.  Pt able to return demonstration with reports of decreased onset of pain.  Pt to attempt use of red built up foam with personal utensils at home to see if they decrease pain during self-feeding.   Ice  massage Attempted Ice massage along index finger between PIP J and MCP in circular motion.  Pt tolerating 2-3 mins before reporting increased pain, especially along inside of index finger.  Applied ice pack to hand and index and long finger specifically while discussing more specific location of application of ice at home.    AROM/PROM Attempted gentle AROM and PROM of composite finger flex/ext.  Pt demonstrating improved flexion of all digits, to include index this session. Therapist instructed on MCP PROM and PIP with blocking at index and long finger.  Pt reports increased pain in index during MCP PROM. Pt tolerated increased participation in tendon gliding with making claw fist, partial fist, full fist, and table top with MCP, PIP, and DIP without significant pain.  Pt reports increase in pain in index finger to 2-3 due to repetition, therefore terminated task.      PATIENT EDUCATION: Ongoing condition-specific education; see tx section above Person educated: Patient Education method: Explanation Education comprehension: verbalized understanding   HOME EXERCISE PROGRAM: Soft tissue massage for ~5 min, 2x/day Moist heat for ~15 min, 2x/day Access Code: [email or web address] Tendon Glides  - 1 x daily - 5 reps - 2-3 seconds hold Seated Finger Composite Flexion Stretch  - 1 x daily - 3-5 repsAccess Code:  Hand AROM PIP Blocking  - 1 x daily - 3-5 reps Hand PROM MCP Flexion  - 1 x daily - 3-5 reps   GOALS: Goals reviewed with patient? Yes  SHORT TERM GOALS: Target date:  04/24/2022       Status:  1 Pt will be independent with HEP for RUE strengthening and coordination to increase independence with ADLs and IADLs  Progressing  2 Pt will be able to utilize RUE at diminished level when opening water bottle and other meal prep items  Progressing  3 Pt will verbalize understanding of task modifications and/or potential A/E needs to increase ease, safety, and independence w/ ADLS  Progressing  4 Pt will  demonstrate improved UE functional use for ADLs as evidenced by increasing box/ blocks score by 3 blocks with RUE Baseline: R: 16 and L: 29 (in 30 seconds bilaterally) Progressing    LONG TERM GOALS: Target date:  05/22/2022       Status:  1 Pt will demonstrate increased grip strength to 20# to 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 in pain > 2 on 10 point pain scale Baseline: R: 15# and L: 45# Progressing  2 Pt will demonstrate ability to utilize RUE at dominant level when opening water bottle or other meal prep tasks without increase in pain >2 on 10 point pain scale  Progressing  3 Pt will demonstrate improved UE functional use for ADLs as evidenced  by increasing box/ blocks score by 6 blocks with RUE Baseline: R: 16 and L: 29 (in 30 second time limit) Progressing  4 Pt will report improvements in pain and functional use of dominant RUE as reflected on QuickDASH by improving score from 50% to 30% Baseline: 50% Progressing  5 Pt will report improvements in functional use of RUE to complete ADLs and IADLs without increase in pain  Progressing    ASSESSMENT:  CLINICAL IMPRESSION: Treatment session with focus on use of modalities for edema and pain management, AE education for increased independence with self-feeding, and gentle passive and active movement of digits. Pt demonstrating improved tolerance of  R index finger flexion at MCP and PIP J without significant increase in pain (from 0/10 to 2-3/10 with intermittent grimacing). Pt demonstrating increased grimacing and pulling away with ice massage, therefore modified to placing ice pack on hand with focus on placement at index and long finger primarily. Pt reports desire to move MRI (currently scheduled for 8/21) up to allow for more time for treatment prior to returning to work as work functional really exacerbate her pain.  Pt verbalizes frustration with ongoing pain and wants answers and relief. Pt is  motivated to improve and be able to return to work at the end of this month.  PERFORMANCE DEFICITS in functional skills including ADLs, IADLs, coordination, dexterity, edema, ROM, strength, pain, flexibility, FMC, GMC, mobility, decreased knowledge of precautions, decreased knowledge of use of DME, and UE functional use.  IMPAIRMENTS are limiting patient from ADLs, IADLs, and work.   COMORBIDITIES may have co-morbidities  that affects occupational performance. Patient will benefit from skilled OT to address above impairments and improve overall function.   PLAN: OT FREQUENCY: 2x/week  OT DURATION: 8 weeks  PLANNED INTERVENTIONS: self care/ADL training, therapeutic exercise, therapeutic activity, manual therapy, passive range of motion, splinting, ultrasound, paraffin, fluidotherapy, compression bandaging, moist heat, cryotherapy, patient/family education, and DME and/or AE instructions  RECOMMENDED OTHER SERVICES: N/A  CONSULTED AND AGREED WITH PLAN OF CARE: Patient  PLAN FOR NEXT SESSION: Continue w/ initial pain management: soft tissue massage to posterior forearm, ultrasound, and introduce compensatory strategies/AE to avoid aggravating movements prn   Mayte Diers, OTR/L 04/15/2022, 10:33 AM

## 2022-04-20 ENCOUNTER — Ambulatory Visit: Payer: BC Managed Care – PPO | Admitting: Occupational Therapy

## 2022-04-20 DIAGNOSIS — M79641 Pain in right hand: Secondary | ICD-10-CM

## 2022-04-20 DIAGNOSIS — M25521 Pain in right elbow: Secondary | ICD-10-CM | POA: Diagnosis not present

## 2022-04-20 DIAGNOSIS — M6281 Muscle weakness (generalized): Secondary | ICD-10-CM

## 2022-04-20 DIAGNOSIS — M25641 Stiffness of right hand, not elsewhere classified: Secondary | ICD-10-CM

## 2022-04-20 NOTE — Therapy (Addendum)
OUTPATIENT OCCUPATIONAL THERAPY TREATMENT NOTE  Patient Name: Destiny Sharp MRN: [id number] DOB:[birth year], 51 y.o., female Today's Date: 04/20/2022  PCP: Vicenta Aly, [place] REFERRING PROVIDER: Sherilyn Cooter, MD    OT End of Session - 04/20/22 1532     Visit Number 8    Number of Visits 17    Date for OT Re-Evaluation 05/22/22    Authorization Type [employer] PPO    OT Start Time 1533    OT Stop Time 1619    OT Time Calculation (min) 46 min    Activity Tolerance Patient tolerated treatment well    Behavior During Therapy WFL for tasks assessed/performed               Past Medical History:  Diagnosis Date   Allergy    Anxiety    no meds   Asthma    Bronchitis    one or two times/year   Edema    LLE   Elevated liver function tests    Fibromyalgia    Headache(784.0)    otc med prn   Hx of cardiovascular stress test 11/22/12   Lex MV 2/14: EF 81%, no ischemia   Hyperlipidemia    diet controlled - no med   Hypothyroidism    Pneumonia    hx   PONV (postoperative nausea and vomiting)    Scoliosis    history   Sleep apnea    Does not use CPAP every night   Vasovagal syncope    one episode only   Past Surgical History:  Procedure Laterality Date   ABDOMINAL HYSTERECTOMY     APPENDECTOMY     CYSTOSCOPY  05/22/2014   Procedure: CYSTOSCOPY;  Surgeon: Jamey Reas de Berton Lan, MD;  Location: [facility];  Service: Gynecology;;   MOUTH SURGERY     wisdom teeth ext   NASAL SINUS SURGERY     x 3   ROBOTIC ASSISTED LAPAROSCOPIC LYSIS OF ADHESION Bilateral 05/22/2014   Procedure: XI ROBOTIC ASSISTED LAPAROSCOPIC LYSIS OF extensive ADHESION;  Surgeon: Jamey Reas de Berton Lan, MD;  Location: [facility];  Service: Gynecology;  Laterality: Bilateral;   ROBOTIC ASSISTED TOTAL HYSTERECTOMY WITH BILATERAL SALPINGO OOPHERECTOMY N/A 05/22/2014   Procedure: ROBOTIC ASSISTED TOTAL HYSTERECTOMY WITH BILATERAL SALPINGO OOPHORECTOMY and cystoscopy;   Surgeon: Jamey Reas de Berton Lan, MD;  Location: [facility];  Service: Gynecology;  Laterality: N/A;   Patient Active Problem List   Diagnosis Date Noted   Lateral epicondylitis, right elbow 03/24/2022   Pain in right hand 06/24/2021   Seasonal and perennial allergic rhinitis 07/07/2014   Status post laparoscopic hysterectomy 05/22/2014   Vasovagal attack 03/07/2014   Obstructive sleep apnea 01/17/2013   Abnormal CT of the chest 10/30/2012   Chest pain 10/07/2012   Hypothyroidism    Hypertension    Allergic asthma, mild intermittent, uncomplicated    Fatigue    Edema     ONSET DATE: October 2022 (referral date 01/15/2022)  REFERRING DIAG: M79.641 (ICD-10-CM) - Pain in right hand   THERAPY DIAG:  Pain in right elbow  Pain in right hand  Muscle weakness (generalized)  Stiffness of right hand, not elsewhere classified  Rationale for Evaluation and Treatment Rehabilitation  SUBJECTIVE:   SUBJECTIVE STATEMENT: Pt reports that the index finger is very tender along the medial side. Pt accompanied by: self  PERTINENT HISTORY: R hand pain, seasonal allergies, obstructive sleep apnea, hypothyroidism, HTN  PAIN: Are you having pain? 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  Pt with mild edema in index finger and space between MCP J of index and long finger.  Therapist reviewed technique and recommendation for rest, elevation, ice, massage.  AROM/PROM Reviewed all exercises on HEP and added finger extension, abduction, and thumb opposition.  Pt tolerated increased  participation in tendon gliding with making claw fist, partial fist, full fist, and table top with MCP, PIP, and DIP without significant pain.  Completed 1x10.  Pt grimacing with effort with index finger with table top and full fist, however demonstrating improved ability with each position.  Therapist instructed on finger extension, abduction, and thumb opposition.  Pt completing 1x10 finger extension, x5 abduction, and x2 thumb opposition.  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  Pt reports intermittent use of LUE to reach across, however is difficult due to reach and does not want to injure other arm.  Discussed recommendation to speak with supervisor and to assess options based on MRI results and pain.    PATIENT EDUCATION: Ongoing condition-specific education; see tx section above Person educated: Patient Education method: Explanation Education comprehension: verbalized understanding   HOME EXERCISE PROGRAM: Soft tissue massage for ~5 min, 2x/day Moist heat for ~15 min, 2x/day Access Code: [email or web address] URL: [email or web address] - Tendon Glides  - 1-2 x daily - 5-10 reps - 2-3 seconds hold - Seated Finger Composite Flexion Stretch  - 1-2 x daily - 5-10 reps - Hand AROM PIP Blocking  - 1-2 x daily - 5-10 reps - Hand PROM MCP Flexion  - 1-2 x daily - 5-10 reps - Seated Single Finger Extension  - 1-2 x daily - 10 reps - Finger Spreading  - 1-2 x daily - 10 reps - Thumb Opposition  - 1-2 x daily - 10 reps   GOALS: Goals reviewed with patient? Yes  SHORT TERM GOALS: Target date:  04/24/2022       Status:  1 Pt will be independent with HEP for RUE strengthening and coordination to increase independence with ADLs and IADLs  Progressing - completing PROM/AROM HEP per instructions, increased frequency on 04/20/22  2 Pt will be able to utilize RUE at diminished level when opening water  bottle and other meal prep items  Progressing - utilizing RUE as stabilizer to gross assist due to pain  3 Pt will verbalize understanding of task modifications and/or potential A/E needs to increase ease, safety, and independence w/ ADLS  Met - 0/32/12  4 Pt will demonstrate improved UE functional use for ADLs as evidenced by increasing box/ blocks score by 3 blocks with RUE Baseline: R: 16 and L: 29 (in 30 seconds bilaterally) Met - 04/20/22  L: 28 blocks    LONG TERM GOALS: Target date:  05/22/2022       Status:  1 Pt will demonstrate increased grip strength to 20# to 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 in pain > 2 on 10 point pain scale  Baseline: R: 15# and L: 45# Progressing  2 Pt will demonstrate ability to utilize RUE at dominant level when opening water bottle or other meal prep tasks without increase in pain >2 on 10 point pain scale  Progressing  3 Pt will demonstrate improved UE functional use for ADLs as evidenced by increasing box/ blocks score by 6 blocks with RUE Baseline: R: 16 and L: 29 (in 30 second time limit) Progressing  4 Pt will report improvements in pain and functional use of dominant RUE as reflected on QuickDASH by improving score from 50% to 30% Baseline: 50% Progressing  5 Pt will report improvements in functional use of RUE to complete ADLs and IADLs without increase in pain  Progressing    ASSESSMENT:  CLINICAL IMPRESSION: Treatment session with focus on use of modalities for edema and pain management and gentle active movement of digits. Pt demonstrating improved tolerance of  R index finger flexion at MCP and PIP J without significant increase in pain.  Engaged in discussion about body mechanics in regards to job performance and tasks as well as possible adaptive techniques.  Therapist encouraged rest, ice, massage, and elevation until MRI and MRI results.  Encouraged tendon gliding and finger AROM/PROM as able to  continue to maintain movement.  Pt verbalizes frustration with ongoing pain and wants answers and relief. Pt is motivated to improve and be able to return to work at the end of this month.  Due to current status, pain, and awaiting MRI, plan to defer?for 2-4 weeks to determine continued plan and focus of skilled OT services. Pt encouraged to call back before that time post MRI results to address pain and edema if they have not resolved.  If MRI reveals additional issues, pt may require a new referral to restart OT services.  PERFORMANCE DEFICITS in functional skills including ADLs, IADLs, coordination, dexterity, edema, ROM, strength, pain, flexibility, FMC, GMC, mobility, decreased knowledge of precautions, decreased knowledge of use of DME, and UE functional use.  IMPAIRMENTS are limiting patient from ADLs, IADLs, and work.   COMORBIDITIES may have co-morbidities  that affects occupational performance. Patient will benefit from skilled OT to address above impairments and improve overall function.   PLAN: OT FREQUENCY: 2x/week  OT DURATION: 8 weeks  PLANNED INTERVENTIONS: self care/ADL training, therapeutic exercise, therapeutic activity, manual therapy, passive range of motion, splinting, ultrasound, paraffin, fluidotherapy, compression bandaging, moist heat, cryotherapy, patient/family education, and DME and/or AE instructions  RECOMMENDED OTHER SERVICES: N/A  CONSULTED AND AGREED WITH PLAN OF CARE: Patient  PLAN FOR NEXT SESSION: Continue w/ initial pain management: soft tissue massage to posterior forearm, ultrasound, and introduce compensatory strategies/AE to avoid aggravating movements prn   Elara Cocke, OTR/L 04/20/2022, 4:39 PM   OCCUPATIONAL THERAPY DISCHARGE SUMMARY  Visits from Start of Care: 8  Current functional level related to goals / functional outcomes: See above treatment note.  Pt with increased pain in hand and forearm and was awaiting f/u imaging therefore  requesting to defer further treatment until results of imagine.  Pt has not returned since last visit and no phone call post imaging results.   Remaining deficits: Pain in hand and forearm limiting engagement in ADLs, IADLs, and work Risk manager / Equipment: Educated on pain management, splinting/bracing, and HEP for ROM    Patient agrees to discharge. Patient goals were not met. Patient is being discharged due to not returning since the last visit.Marland Kitchen

## 2022-04-22 ENCOUNTER — Encounter: Payer: BC Managed Care – PPO | Admitting: Occupational Therapy

## 2022-04-27 ENCOUNTER — Encounter: Payer: BC Managed Care – PPO | Admitting: Occupational Therapy

## 2022-04-27 ENCOUNTER — Ambulatory Visit
Admission: RE | Admit: 2022-04-27 | Discharge: 2022-04-27 | Disposition: A | Discharge status: Home / Self Care | Payer: BC Managed Care – PPO | Source: Ambulatory Visit | Attending: Orthopedic Surgery | Admitting: Orthopedic Surgery

## 2022-04-27 DIAGNOSIS — M79641 Pain in right hand: Secondary | ICD-10-CM

## 2022-04-27 MED ORDER — GADOBENATE DIMEGLUMINE 529 MG/ML IV SOLN
15.0000 mL | Freq: Once | INTRAVENOUS | Status: AC | PRN
  Administered 2022-04-27: 15 mL via INTRAVENOUS

## 2022-04-29 ENCOUNTER — Encounter: Payer: BC Managed Care – PPO | Admitting: Occupational Therapy

## 2022-04-29 ENCOUNTER — Telehealth: Payer: Self-pay | Admitting: Orthopedic Surgery

## 2022-04-29 NOTE — Telephone Encounter (Signed)
Patient called. 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 Her call back number is [phone]

## 2022-04-30 ENCOUNTER — Encounter: Payer: Self-pay | Admitting: Orthopedic Surgery

## 2022-04-30 ENCOUNTER — Ambulatory Visit: Payer: BC Managed Care – PPO | Admitting: Orthopedic Surgery

## 2022-05-07 ENCOUNTER — Telehealth: Payer: Self-pay | Admitting: *Deleted

## 2022-05-07 ENCOUNTER — Other Ambulatory Visit: Payer: Self-pay | Admitting: Orthopedic Surgery

## 2022-05-07 DIAGNOSIS — M79641 Pain in right hand: Secondary | ICD-10-CM

## 2022-05-07 NOTE — Telephone Encounter (Signed)
Is this ok to do?

## 2022-05-07 NOTE — Telephone Encounter (Signed)
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 Would like to have this as an urgent if possible.

## 2022-05-25 ENCOUNTER — Other Ambulatory Visit: Payer: Self-pay

## 2022-05-25 ENCOUNTER — Emergency Department (HOSPITAL_BASED_OUTPATIENT_CLINIC_OR_DEPARTMENT_OTHER)
Admission: EM | Admit: 2022-05-25 | Discharge: 2022-05-25 | Disposition: A | Discharge status: Home / Self Care | Payer: BC Managed Care – PPO | Attending: Emergency Medicine | Admitting: Emergency Medicine

## 2022-05-25 ENCOUNTER — Emergency Department (HOSPITAL_BASED_OUTPATIENT_CLINIC_OR_DEPARTMENT_OTHER): Payer: BC Managed Care – PPO

## 2022-05-25 ENCOUNTER — Encounter (HOSPITAL_BASED_OUTPATIENT_CLINIC_OR_DEPARTMENT_OTHER): Payer: Self-pay

## 2022-05-25 DIAGNOSIS — Y92410 Unspecified street and highway as the place of occurrence of the external cause: Secondary | ICD-10-CM | POA: Diagnosis not present

## 2022-05-25 DIAGNOSIS — H8110 Benign paroxysmal vertigo, unspecified ear: Secondary | ICD-10-CM | POA: Diagnosis not present

## 2022-05-25 DIAGNOSIS — Y990 Civilian activity done for income or pay: Secondary | ICD-10-CM | POA: Diagnosis not present

## 2022-05-25 DIAGNOSIS — R42 Dizziness and giddiness: Secondary | ICD-10-CM | POA: Diagnosis present

## 2022-05-25 DIAGNOSIS — M542 Cervicalgia: Secondary | ICD-10-CM | POA: Diagnosis not present

## 2022-05-25 MED ORDER — ONDANSETRON HCL 4 MG PO TABS: 4.0000 mg | ORAL_TABLET | Freq: Three times a day (TID) | ORAL | 0 refills | Status: DC | PRN

## 2022-05-25 MED ORDER — SODIUM CHLORIDE 0.9 % IV BOLUS: 500.0000 mL | Freq: Once | INTRAVENOUS | Status: DC

## 2022-05-25 MED ORDER — MECLIZINE HCL 25 MG PO TABS: 25.0000 mg | ORAL_TABLET | Freq: Three times a day (TID) | ORAL | 0 refills | Status: DC | PRN

## 2022-05-25 MED ORDER — MECLIZINE HCL 25 MG PO TABS
25.0000 mg | ORAL_TABLET | Freq: Once | ORAL | Status: AC
  Administered 2022-05-25: 25 mg via ORAL
  Filled 2022-05-25: qty 1

## 2022-05-25 MED ORDER — ACETAMINOPHEN 325 MG PO TABS
650.0000 mg | ORAL_TABLET | Freq: Once | ORAL | Status: AC
  Administered 2022-05-25: 650 mg via ORAL
  Filled 2022-05-25: qty 2

## 2022-05-25 MED ORDER — ONDANSETRON 4 MG PO TBDP
4.0000 mg | ORAL_TABLET | Freq: Once | ORAL | Status: AC
  Administered 2022-05-25: 4 mg via ORAL
  Filled 2022-05-25: qty 1

## 2022-05-25 NOTE — ED Notes (Signed)
Pt. Requesting CT disc for her chiropractor. Release signed by patient with CT tech. Disc provided.

## 2022-05-25 NOTE — Discharge Instructions (Addendum)
It was a pleasure taking care of you today!  Your scans today were unremarkable.  You may take over-the-counter 500 mg Tylenol every 6 hours and alternate with 600 mg ibuprofen every 6 hours as needed for pain for no more than 7 days.  You may place ice or heat to the affected areas for up to 15 minutes at a time, ensure to place a barrier between your skin and the ice/heat.  You may follow-up with your care team as needed.  You will be sent a prescription for meclizine and Zofran, take as directed.  Return to the emergency department for experiencing increasing/worsening symptoms.
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
## 2022-05-25 NOTE — ED Notes (Signed)
Pt. Refusing IV access and fluids. Provided patient with PO fluids and crackers with medications.
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
## 2022-06-04 ENCOUNTER — Telehealth: Payer: Self-pay | Admitting: Internal Medicine

## 2022-06-04 NOTE — Telephone Encounter (Signed)
Left msg for pt to schedule hst

## 2022-06-10 ENCOUNTER — Ambulatory Visit: Payer: BC Managed Care – PPO

## 2022-06-10 DIAGNOSIS — G4733 Obstructive sleep apnea (adult) (pediatric): Secondary | ICD-10-CM

## 2022-06-10 DIAGNOSIS — R0683 Snoring: Secondary | ICD-10-CM | POA: Diagnosis not present

## 2022-06-15 ENCOUNTER — Encounter: Payer: Self-pay | Admitting: Occupational Therapy

## 2022-06-19 DIAGNOSIS — R0683 Snoring: Secondary | ICD-10-CM | POA: Diagnosis not present
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
## 2022-07-10 ENCOUNTER — Ambulatory Visit (INDEPENDENT_AMBULATORY_CARE_PROVIDER_SITE_OTHER): Payer: BC Managed Care – PPO | Admitting: Internal Medicine

## 2022-07-10 ENCOUNTER — Encounter: Payer: Self-pay | Admitting: Internal Medicine

## 2022-07-10 VITALS — BP 120/66 | HR 100 | Ht 62.0 in | Wt 172.8 lb

## 2022-07-10 DIAGNOSIS — M79641 Pain in right hand: Secondary | ICD-10-CM

## 2022-07-10 DIAGNOSIS — G4733 Obstructive sleep apnea (adult) (pediatric): Secondary | ICD-10-CM | POA: Diagnosis not present

## 2022-07-10 DIAGNOSIS — J4520 Mild intermittent asthma, uncomplicated: Secondary | ICD-10-CM | POA: Diagnosis not present

## 2022-07-10 NOTE — Assessment & Plan Note (Signed)
Diagnosis of rheumatoid arthritis being managed by rheumatology

## 2022-07-10 NOTE — Patient Instructions (Signed)
Glad your sleep study was within normal limits, so you don't need CPAP  Please call if we can help

## 2022-07-10 NOTE — Assessment & Plan Note (Signed)
Based on her updated sleep study she no longer has obstructive sleep apnea and does not need to use CPAP. Plan-letter for employer

## 2022-07-10 NOTE — Assessment & Plan Note (Signed)
Mild, uncomplicated, not needing treatment at this time. Plan-she will hold onto her inhalers in case of problems this winter.

## 2022-07-17 NOTE — Telephone Encounter (Signed)
Patient has completed HST. Will close encounter.

## 2022-11-10 ENCOUNTER — Encounter (HOSPITAL_COMMUNITY): Payer: Self-pay | Admitting: Emergency Medicine

## 2022-11-10 ENCOUNTER — Other Ambulatory Visit: Payer: Self-pay

## 2022-11-10 ENCOUNTER — Emergency Department (HOSPITAL_COMMUNITY): Payer: BC Managed Care – PPO

## 2022-11-10 ENCOUNTER — Emergency Department (HOSPITAL_COMMUNITY)
Admission: EM | Admit: 2022-11-10 | Discharge: 2022-11-10 | Disposition: A | Discharge status: Home / Self Care | Payer: BC Managed Care – PPO | Attending: Emergency Medicine | Admitting: Emergency Medicine

## 2022-11-10 DIAGNOSIS — I10 Essential (primary) hypertension: Secondary | ICD-10-CM | POA: Diagnosis not present

## 2022-11-10 DIAGNOSIS — R1012 Left upper quadrant pain: Secondary | ICD-10-CM | POA: Diagnosis not present

## 2022-11-10 DIAGNOSIS — E039 Hypothyroidism, unspecified: Secondary | ICD-10-CM | POA: Insufficient documentation

## 2022-11-10 DIAGNOSIS — R1013 Epigastric pain: Secondary | ICD-10-CM | POA: Insufficient documentation

## 2022-11-10 DIAGNOSIS — Z79899 Other long term (current) drug therapy: Secondary | ICD-10-CM | POA: Diagnosis not present

## 2022-11-10 DIAGNOSIS — R1011 Right upper quadrant pain: Secondary | ICD-10-CM | POA: Insufficient documentation

## 2022-11-10 LAB — COMPREHENSIVE METABOLIC PANEL
ALT: 25 U/L (ref 0–44)
AST: 23 U/L (ref 15–41)
Albumin: 4.2 g/dL (ref 3.5–5.0)
Alkaline Phosphatase: 78 U/L (ref 38–126)
Anion gap: 12 (ref 5–15)
BUN: 20 mg/dL (ref 6–20)
CO2: 23 mmol/L (ref 22–32)
Calcium: 10.1 mg/dL (ref 8.9–10.3)
Chloride: 104 mmol/L (ref 98–111)
Creatinine, Ser: 0.72 mg/dL (ref 0.44–1.00)
GFR, Estimated: >60 mL/min (ref >60)
Glucose, Bld: 150 mg/dL — ABNORMAL HIGH (ref 70–99)
Potassium: 3.9 mmol/L (ref 3.5–5.1)
Sodium: 139 mmol/L (ref 135–145)
Total Bilirubin: 0.8 mg/dL (ref 0.3–1.2)
Total Protein: 7.3 g/dL (ref 6.5–8.1)

## 2022-11-10 LAB — URINALYSIS, ROUTINE W REFLEX MICROSCOPIC
Bilirubin Urine: NEGATIVE
Glucose, UA: NEGATIVE mg/dL
Ketones, ur: NEGATIVE mg/dL
Nitrite: NEGATIVE
Protein, ur: NEGATIVE mg/dL
Specific Gravity, Urine: 1.014 (ref 1.005–1.030)
pH: 6 (ref 5.0–8.0)

## 2022-11-10 LAB — CBC WITH DIFFERENTIAL/PLATELET
Abs Immature Granulocytes: 0.02 10*3/uL (ref 0.00–0.07)
Basophils Absolute: 0.1 10*3/uL (ref 0.0–0.1)
Basophils Relative: 1 %
Eosinophils Absolute: 0.1 10*3/uL (ref 0.0–0.5)
Eosinophils Relative: 1 %
HCT: 38.4 % (ref 36.0–46.0)
Hemoglobin: 13.9 g/dL (ref 12.0–15.0)
Immature Granulocytes: 0 %
Lymphocytes Relative: 18 %
Lymphs Abs: 1.6 10*3/uL (ref 0.7–4.0)
MCH: 31.9 pg (ref 26.0–34.0)
MCHC: 36.2 g/dL — ABNORMAL HIGH (ref 30.0–36.0)
MCV: 88.1 fL (ref 80.0–100.0)
Monocytes Absolute: 0.3 10*3/uL (ref 0.1–1.0)
Monocytes Relative: 4 %
Neutro Abs: 6.4 10*3/uL (ref 1.7–7.7)
Neutrophils Relative %: 76 %
Platelets: 258 10*3/uL (ref 150–400)
RBC: 4.36 MIL/uL (ref 3.87–5.11)
RDW: 12.9 % (ref 11.5–15.5)
WBC: 8.5 10*3/uL (ref 4.0–10.5)
nRBC: 0 % (ref 0.0–0.2)

## 2022-11-10 LAB — LIPASE, BLOOD: Lipase: 38 U/L (ref 11–51)

## 2022-11-10 MED ORDER — ALUM & MAG HYDROXIDE-SIMETH 200-200-20 MG/5ML PO SUSP
30.0000 mL | Freq: Once | ORAL | Status: AC
  Administered 2022-11-10: 30 mL via ORAL
  Filled 2022-11-10: qty 30

## 2022-11-10 MED ORDER — HYDROCODONE-ACETAMINOPHEN 5-325 MG PO TABS: 2.0000 | ORAL_TABLET | ORAL | 0 refills | Status: DC | PRN

## 2022-11-10 MED ORDER — FAMOTIDINE IN NACL 20-0.9 MG/50ML-% IV SOLN
20.0000 mg | Freq: Once | INTRAVENOUS | Status: AC
  Administered 2022-11-10: 20 mg via INTRAVENOUS
  Filled 2022-11-10: qty 50

## 2022-11-10 MED ORDER — KETOROLAC TROMETHAMINE 15 MG/ML IJ SOLN
15.0000 mg | Freq: Once | INTRAMUSCULAR | Status: AC
  Administered 2022-11-10: 15 mg via INTRAMUSCULAR
  Filled 2022-11-10: qty 1

## 2022-11-10 NOTE — ED Provider Triage Note (Signed)
Emergency Medicine Provider Triage Evaluation Note  Destiny Sharp , a 52 y.o. female  was evaluated in triage.  Pt complains of abdominal pain.  Patient reports that she been experiencing right upper quadrant abdominal pain for the last 3 days.  History of gallstones.  She reports that there is significant tenderness at the site consequently worsened by diet or fluids.  States that she is previously worked up for right upper quadrant pain by a physician at [facility].  Review of Systems  Positive: As above Negative: As above  Physical Exam  BP 126/77 (BP Location: Left Arm)   Pulse 91   Temp 98.5 F (36.9 C) (Oral)   Resp 16   Ht '5\' 2"'$  (1.575 m)   Wt 74.8 kg   LMP 05/22/2014 (Exact Date)   SpO2 95%   BMI 30.18 kg/m  Gen:   Awake, no distress   Resp:  Normal effort  MSK:   Moves extremities without difficulty  Other:    Medical Decision Making  Medically screening exam initiated at 4:02 PM.  Appropriate orders placed.  Destiny Sharp was informed that the remainder of the evaluation will be completed by another provider, this initial triage assessment does not replace that evaluation, and the importance of remaining in the ED until their evaluation is complete.     Luvenia Heller, PA-C 11/10/22 [phone]

## 2022-11-10 NOTE — ED Provider Notes (Signed)
[facility] Provider Note   CSN: [id number] Arrival date & time: 11/10/22  1524     History  Chief Complaint  Patient presents with   Abdominal Pain    Destiny Sharp is a 52 y.o. female.  Patient presents to the emergency department complaining of abdominal pain.  Patient states she has had right upper quadrant and epigastric pain for the past 3 days today she says he has a history of gallstones and feels this is similar pain.  She endorses nausea with no vomiting.  She denies chest pain, shortness of breath, fevers.  Past medical history significant for rheumatoid arthritis, hypertension, hypothyroidism, elevated liver function tests  HPI     Home Medications Prior to Admission medications   Medication Sig Start Date End Date Taking? Authorizing Provider  HYDROcodone-acetaminophen (NORCO/VICODIN) 5-325 MG tablet Take 2 tablets by mouth every 4 (four) hours as needed. 11/10/22  Yes Dorothyann Peng, PA-C  Accu-Chek Softclix Lancets lancets  10/20/19   [provider]  albuterol (PROVENTIL HFA;VENTOLIN HFA) 108 (90 Base) MCG/ACT inhaler Inhale 2 puffs into the lungs every 4 (four) hours as needed for wheezing or shortness of breath. 12/06/17   Deneise Lever, MD  Blood Glucose Monitoring Suppl DEVI Use as directed once daily. Please dispense meter best covered by insurance. 10/18/19   [provider]  calcium carbonate (OS-CAL - DOSED IN MG OF ELEMENTAL CALCIUM) 1250 MG tablet Take 1 tablet by mouth daily.    [provider]  Cholecalciferol (VITAMIN D-3 PO) Take by mouth.    [provider]  DENTA 5000 PLUS 1.1 % CREA dental cream Place 1 application onto teeth at bedtime as needed (dental care).  05/03/14   [provider]  diclofenac Sodium (VOLTAREN) 1 % GEL Apply 4 g topically 4 (four) times daily. 03/22/22   Fondaw, Kathleene Hazel, PA  fluticasone (FLONASE) 50 MCG/ACT nasal spray Place 1 spray into  both nostrils daily as needed for allergies.  01/11/14   [provider]  fluticasone furoate-vilanterol (BREO ELLIPTA) 100-25 MCG/INH AEPB Inhale 1 puff then rinse mouth,once daily maintenance 10/23/19   Young, Tarri Fuller D, MD  Glucose Blood (BLOOD GLUCOSE TEST STRIPS) STRP Use as instructed once daily. 10/18/19   [provider]  ipratropium-albuterol (DUONEB) 0.5-2.5 (3) MG/3ML SOLN Take 3 mLs by nebulization every 6 (six) hours as needed (asthma).    [provider]  levothyroxine (SYNTHROID, LEVOTHROID) 50 MCG tablet Take 50 mcg by mouth daily before breakfast.     [provider]  meclizine (ANTIVERT) 25 MG tablet Take 1 tablet (25 mg total) by mouth 3 (three) times daily as needed for dizziness. 05/25/22   Blue, Soijett A, PA-C  metFORMIN (GLUCOPHAGE) 500 MG tablet Take 1 tablet by mouth in the morning and at bedtime. 06/19/20   [provider]  mirabegron ER (MYRBETRIQ) 50 MG TB24 tablet Take 50 mg by mouth daily.    [provider]  Multiple Vitamin (MULTIVITAMIN WITH MINERALS) TABS Take 1 tablet by mouth every morning.     [provider]  Nebulizers (COMPRESSOR/NEBULIZER) MISC Use as directed for asthma 02/03/13   Baird Lyons D, MD  Omega-3 Fatty Acids (FISH OIL PO) Take 2,400 mg by mouth daily.    [provider]  ondansetron (ZOFRAN) 4 MG tablet Take 1 tablet (4 mg total) by mouth every 8 (eight) hours as needed for nausea or vomiting. 05/25/22   Blue, Soijett  A, PA-C  OVER THE COUNTER MEDICATION Place 1 application onto the skin as needed. Essential oil therapy Anola Gurney, Stress Away    [provider]  predniSONE (STERAPRED UNI-PAK 21 TAB) 10 MG (21) TBPK tablet Audria Nine, MD 04/10/22   Sherilyn Cooter, MD  Spacer/Aero-Holding Josiah Lobo ([place]) MISC  01/27/13   [provider]      Allergies    Pneumococcal vaccine, Codeine, Hepatitis b vaccine recomb (3-antigen),  Hepatitis b virus vaccines, Imitrex [sumatriptan], and Pneumococcal vaccines    Review of Systems   Review of Systems  Constitutional:  Negative for fever.  Respiratory:  Negative for shortness of breath.   Cardiovascular:  Negative for chest pain.  Gastrointestinal:  Positive for abdominal pain and nausea. Negative for vomiting.    Physical Exam Updated Vital Signs BP 134/74 (BP Location: Left Arm)   Pulse 79   Temp 98.2 F (36.8 C) (Oral)   Resp 18   Ht '5\' 2"'$  (1.575 m)   Wt 74.8 kg   LMP 05/22/2014 (Exact Date)   SpO2 99%   BMI 30.18 kg/m  Physical Exam Vitals and nursing note reviewed.  Constitutional:      General: She is not in acute distress.    Appearance: She is well-developed.  HENT:     Head: Normocephalic and atraumatic.  Eyes:     Conjunctiva/sclera: Conjunctivae normal.  Cardiovascular:     Rate and Rhythm: Normal rate and regular rhythm.     Heart sounds: No murmur heard. Pulmonary:     Effort: Pulmonary effort is normal. No respiratory distress.     Breath sounds: Normal breath sounds.  Abdominal:     Palpations: Abdomen is soft.     Tenderness: There is abdominal tenderness in the right upper quadrant, epigastric area and left upper quadrant.  Musculoskeletal:        General: No swelling.     Cervical back: Neck supple.  Skin:    General: Skin is warm and dry.     Capillary Refill: Capillary refill takes less than 2 seconds.  Neurological:     Mental Status: She is alert.  Psychiatric:        Mood and Affect: Mood normal.     ED Results / Procedures / Treatments   Labs (all labs ordered are listed, but only abnormal results are displayed) Labs Reviewed  COMPREHENSIVE METABOLIC PANEL - Abnormal; Notable for the following components:      Result Value   Glucose, Bld 150 (*)    All other components within normal limits  CBC WITH DIFFERENTIAL/PLATELET - Abnormal; Notable for the following components:   MCHC 36.2 (*)    All other components  within normal limits  URINALYSIS, ROUTINE W REFLEX MICROSCOPIC - Abnormal; Notable for the following components:   Hgb urine dipstick MODERATE (*)    Leukocytes,Ua TRACE (*)    Bacteria, UA RARE (*)    All other components within normal limits  LIPASE, BLOOD    EKG EKG Interpretation  Date/Time:  Tuesday November 10 2022 18:34:04 EST Ventricular Rate:  79 PR Interval:  168 QRS Duration: 88 QT Interval:  368 QTC Calculation: 421 R Axis:   63 Text Interpretation: Normal sinus rhythm no acute ST/T changes similar to 2017 Confirmed by Sherwood Gambler [phone]) on 11/10/2022 7:48:34 PM  Radiology US Abdomen Limited RUQ (LIVER/GB)  Result Date: 11/10/2022 CLINICAL DATA:  Right upper quadrant pain EXAM: ULTRASOUND ABDOMEN LIMITED RIGHT UPPER QUADRANT COMPARISON:  CT abdomen/pelvis  06/11/2014 FINDINGS: Gallbladder: There is layering sludge in the gallbladder. There are no shadowing stones. There is no gallbladder wall thickening. Sonographic Murphy's sign was reported negative. Common bile duct: Diameter: 2 mm Liver: Parenchymal echogenicity is increased. There are no focal lesions. Portal vein is patent on color Doppler imaging with normal direction of blood flow towards the liver. Other: None. IMPRESSION: 1. Gallbladder sludge but no shadowing stones or evidence of acute cholecystitis. 2. Fatty infiltration of the liver. Electronically Signed   By: Valetta Mole M.D.   On: 11/10/2022 17:09    Procedures Procedures    Medications Ordered in ED Medications  ketorolac (TORADOL) 15 MG/ML injection 15 mg (15 mg Intramuscular Given 11/10/22 1941)  famotidine (PEPCID) IVPB 20 mg premix (20 mg Intravenous New Bag/Given 11/10/22 2109)  alum & mag hydroxide-simeth (MAALOX/MYLANTA) 200-200-20 MG/5ML suspension 30 mL (30 mLs Oral Given 11/10/22 2059)    ED Course/ Medical Decision Making/ A&P                             Medical Decision Making Risk OTC drugs. Prescription drug management.   This  patient presents to the ED for concern of abdominal pain, this involves an extensive number of treatment options, and is a complaint that carries with it a high risk of complications and morbidity.  The differential diagnosis includes cholecystitis, pancreatitis, appendicitis, colitis, others   Co morbidities that complicate the patient evaluation  History of gallstones    Lab Tests:  I Ordered, and personally interpreted labs.  The pertinent results include:  Grossly unremarkable labs   Imaging Studies ordered:  I ordered imaging studies including US abdomen RUQ  I independently visualized and interpreted imaging which showed  1. Gallbladder sludge but no shadowing stones or evidence of acute  cholecystitis.  2. Fatty infiltration of the liver   I agree with the radiologist interpretation   Cardiac Monitoring: / EKG:  The patient was maintained on a cardiac monitor.  I personally viewed and interpreted the cardiac monitored which showed an underlying rhythm of: Normal sinus rhythm abdominal   Problem List / ED Course / Critical interventions / Medication management   I ordered medication including Toradol, Pepcid, Maalox for pain Reevaluation of the patient after these medicines showed that the patient improved I have reviewed the patients home medicines and have made adjustments as needed   Test / Admission - Considered:  The patient feels somewhat better after medication ministration.  Feel this is more likely reflux and biliary colic at this time.  Ultrasound grossly unremarkable except for biliary sludge.  Patient is afebrile, no white count, liver enzymes unremarkable.  Patient feels comfortable discharge home at this time with outpatient follow-up.  If patient's pain worsens or she develops intractable vomiting or other life-threatening symptoms she will return to the emergency department.  Hydrocodone prescription provided.        Final Clinical Impression(s) /  ED Diagnoses Final diagnoses:  Epigastric pain    Rx / DC Orders ED Discharge Orders          Ordered    HYDROcodone-acetaminophen (NORCO/VICODIN) 5-325 MG tablet  Every 4 hours PRN        11/10/22 2249              Ronny Bacon 11/10/22 2251    Sherwood Gambler, MD 11/11/22 1721

## 2022-11-10 NOTE — Discharge Instructions (Signed)
You were evaluated today for abdominal pain.  Your ultrasound shows possible sludge in the gallbladder but no signs of cholecystitis at this time.  Please follow-up with your primary care provider for further evaluation and management.  I have provided a short course of pain medication to be used as needed.  If your pain becomes intolerable, you develop a fever, or experience severe uncontrollable vomiting, you should return to the emergency department.

## 2022-11-10 NOTE — ED Triage Notes (Signed)
Pt c/o RUQ abdominal pain for approx 3 days. Hx of gallstones and states it feels the same. Endorses nausea.

## 2022-12-03 ENCOUNTER — Emergency Department (HOSPITAL_COMMUNITY): Payer: BC Managed Care – PPO

## 2022-12-03 ENCOUNTER — Encounter (HOSPITAL_COMMUNITY): Payer: Self-pay

## 2022-12-03 ENCOUNTER — Other Ambulatory Visit: Payer: Self-pay

## 2022-12-03 ENCOUNTER — Emergency Department (HOSPITAL_COMMUNITY)
Admission: EM | Admit: 2022-12-03 | Discharge: 2022-12-03 | Disposition: A | Discharge status: Home / Self Care | Payer: BC Managed Care – PPO | Attending: Emergency Medicine | Admitting: Emergency Medicine

## 2022-12-03 DIAGNOSIS — R109 Unspecified abdominal pain: Secondary | ICD-10-CM | POA: Insufficient documentation

## 2022-12-03 DIAGNOSIS — R509 Fever, unspecified: Secondary | ICD-10-CM | POA: Diagnosis not present

## 2022-12-03 LAB — URINALYSIS, ROUTINE W REFLEX MICROSCOPIC
Bilirubin Urine: NEGATIVE
Glucose, UA: NEGATIVE mg/dL
Hgb urine dipstick: NEGATIVE
Ketones, ur: NEGATIVE mg/dL
Leukocytes,Ua: NEGATIVE
Nitrite: NEGATIVE
Protein, ur: NEGATIVE mg/dL
Specific Gravity, Urine: 1.004 — ABNORMAL LOW (ref 1.005–1.030)
pH: 6 (ref 5.0–8.0)

## 2022-12-03 LAB — COMPREHENSIVE METABOLIC PANEL
ALT: 34 U/L (ref 0–44)
AST: 29 U/L (ref 15–41)
Albumin: 4.8 g/dL (ref 3.5–5.0)
Alkaline Phosphatase: 60 U/L (ref 38–126)
Anion gap: 9 (ref 5–15)
BUN: 8 mg/dL (ref 6–20)
CO2: 27 mmol/L (ref 22–32)
Calcium: 9.8 mg/dL (ref 8.9–10.3)
Chloride: 105 mmol/L (ref 98–111)
Creatinine, Ser: 0.55 mg/dL (ref 0.44–1.00)
GFR, Estimated: >60 mL/min (ref >60)
Glucose, Bld: 106 mg/dL — ABNORMAL HIGH (ref 70–99)
Potassium: 3.4 mmol/L — ABNORMAL LOW (ref 3.5–5.1)
Sodium: 141 mmol/L (ref 135–145)
Total Bilirubin: 1.5 mg/dL — ABNORMAL HIGH (ref 0.3–1.2)
Total Protein: 7.6 g/dL (ref 6.5–8.1)

## 2022-12-03 LAB — CBC WITH DIFFERENTIAL/PLATELET
Abs Immature Granulocytes: 0.01 10*3/uL (ref 0.00–0.07)
Basophils Absolute: 0 10*3/uL (ref 0.0–0.1)
Basophils Relative: 0 %
Eosinophils Absolute: 0.1 10*3/uL (ref 0.0–0.5)
Eosinophils Relative: 2 %
HCT: 37.5 % (ref 36.0–46.0)
Hemoglobin: 13.3 g/dL (ref 12.0–15.0)
Immature Granulocytes: 0 %
Lymphocytes Relative: 18 %
Lymphs Abs: 1 10*3/uL (ref 0.7–4.0)
MCH: 32.5 pg (ref 26.0–34.0)
MCHC: 35.5 g/dL (ref 30.0–36.0)
MCV: 91.7 fL (ref 80.0–100.0)
Monocytes Absolute: 0.3 10*3/uL (ref 0.1–1.0)
Monocytes Relative: 5 %
Neutro Abs: 4.3 10*3/uL (ref 1.7–7.7)
Neutrophils Relative %: 75 %
Platelets: 242 10*3/uL (ref 150–400)
RBC: 4.09 MIL/uL (ref 3.87–5.11)
RDW: 15 % (ref 11.5–15.5)
WBC: 5.7 10*3/uL (ref 4.0–10.5)
nRBC: 0 % (ref 0.0–0.2)

## 2022-12-03 LAB — LIPASE, BLOOD: Lipase: 29 U/L (ref 11–51)

## 2022-12-03 MED ORDER — KETOROLAC TROMETHAMINE 15 MG/ML IJ SOLN
15.0000 mg | Freq: Once | INTRAMUSCULAR | Status: AC
  Administered 2022-12-03: 15 mg via INTRAVENOUS
  Filled 2022-12-03: qty 1

## 2022-12-03 MED ORDER — ACETAMINOPHEN 500 MG PO TABS
1000.0000 mg | ORAL_TABLET | Freq: Once | ORAL | Status: AC
  Administered 2022-12-03: 1000 mg via ORAL
  Filled 2022-12-03: qty 2

## 2022-12-03 MED ORDER — ONDANSETRON HCL 4 MG/2ML IJ SOLN
4.0000 mg | Freq: Once | INTRAMUSCULAR | Status: AC
  Administered 2022-12-03: 4 mg via INTRAVENOUS
  Filled 2022-12-03: qty 2

## 2022-12-03 MED ORDER — KETOROLAC TROMETHAMINE 10 MG PO TABS: 10.0000 mg | ORAL_TABLET | Freq: Three times a day (TID) | ORAL | 0 refills | Status: DC | PRN

## 2022-12-03 NOTE — ED Provider Notes (Signed)
[facility] [facility] EMERGENCY DEPARTMENT AT [facility] Provider Note   CSN: [id number] Arrival date & time: 12/03/22  1649     History Chief Complaint  Patient presents with   Flank Pain    HPI Destiny Sharp , a 52 y.o. female  was evaluated in triage.  Pt complains of diffuse abdominal pain.  States that earlier this week she was seen at [facility] clinic diagnosed with a 3 mm left-sided UVJ nephrolithiasis.  Symptoms initially improved but then worsened again today.  She started having subjective fevers and worsening abdominal pain.  States she called her PCP who recommend she come to the emergency department for workup for potential diverticulitis on top of her nephrolithiasis patient endorses severe pain poor p.o. intake and ambulation..  Patient's recorded medical, surgical, social, medication list and allergies were reviewed in the Snapshot window as part of the initial history.   Review of Systems   Review of Systems  Constitutional:  Positive for fatigue. Negative for chills and fever.  HENT:  Negative for ear pain and sore throat.   Eyes:  Negative for pain and visual disturbance.  Respiratory:  Negative for cough and shortness of breath.   Cardiovascular:  Negative for chest pain and palpitations.  Gastrointestinal:  Positive for abdominal pain. Negative for vomiting.  Genitourinary:  Positive for flank pain. Negative for dysuria and hematuria.  Musculoskeletal:  Negative for arthralgias and back pain.  Skin:  Negative for color change and rash.  Neurological:  Negative for seizures and syncope.  All other systems reviewed and are negative.   Physical Exam Updated Vital Signs BP 107/63   Pulse (!) 102   Temp 98.3 F (36.8 C) (Oral)   Resp 16   Ht 5\' 2"  (1.575 m)   Wt 75 kg   LMP 05/22/2014 (Exact Date)   SpO2 97%   BMI 30.24 kg/m  Physical Exam Vitals and nursing note reviewed.  Constitutional:      General: She is not in acute distress.    Appearance:  She is well-developed.  HENT:     Head: Normocephalic and atraumatic.  Eyes:     Conjunctiva/sclera: Conjunctivae normal.  Cardiovascular:     Rate and Rhythm: Normal rate and regular rhythm.     Heart sounds: No murmur heard. Pulmonary:     Effort: Pulmonary effort is normal. No respiratory distress.     Breath sounds: Normal breath sounds.  Abdominal:     General: There is no distension.     Palpations: Abdomen is soft.     Tenderness: There is abdominal tenderness. There is no right CVA tenderness, left CVA tenderness or guarding.  Musculoskeletal:        General: No swelling or tenderness. Normal range of motion.     Cervical back: Neck supple.  Skin:    General: Skin is warm and dry.  Neurological:     General: No focal deficit present.     Mental Status: She is alert and oriented to person, place, and time. Mental status is at baseline.     Cranial Nerves: No cranial nerve deficit.      ED Course/ Medical Decision Making/ A&P    Procedures Procedures   Medications Ordered in ED Medications  ketorolac (TORADOL) 15 MG/ML injection 15 mg (has no administration in time range)  ondansetron (ZOFRAN) injection 4 mg (has no administration in time range)  acetaminophen (TYLENOL) tablet 1,000 mg (has no administration in time range)  Medical Decision Making:    Destiny Sharp is a 52 y.o. female who presented to the ED today with left-sided flank pain detailed above.     Complete initial physical exam performed, notably the patient  was HDS in NAD.      Reviewed and confirmed nursing documentation for past medical history, family history, social history.    Initial Assessment:   Patient is presenting with a chief complaint of left-sided CVA tenderness left-sided flank pain.  She was diagnosed with a 3 mm urolithiasis earlier this week and an outside hospital.  She is arranged for urology follow-up next week.  States she does not want to take the oxycodone they  prescribed her and is asking for other symptomatic management steps.  She is also having nausea but has not been taking the Zofran prescribed.  We have a long conversation regarding her presentation today.  She states her symptoms are overall mild but persistent and she was hoping she would have passed it by now. We discussed the clinical presentation of urolithiasis.  Her timeline is consistent with ongoing pain from her urolithiasis.  Will evaluate for underlying infection, emergent obstruction or other life-threatening pathology today with screening laboratory evaluation and symptomatic management. Medications were provided and patient was reassessed.  She has had gross symptomatic improvement.  Vital signs in normal limits on reassessment. Labs with no other focal pathology identified.  Discussed utility of repeat CT scan which was requested by her PCP per patient. Given 2 CT scans this week, further cross-sectional imaging is unlikely be diagnostic or beneficial.  Especially in the setting of reassuring laboratory evaluation. Given overall well appearance, patient requested not performing another CT scan today. This is reasonable.  Will prescribe supportive care and have patient follow-up with PCP/urologist within 5 days. Patient ambulatory tolerating p.o. intake in no acute distress on reassessment stable for outpatient care and management.  Plan for strict return precautions regarding development of infection, development of ARF or other new symptoms.  Clinical Impression:  1. Left flank pain      Discharge   Final Clinical Impression(s) / ED Diagnoses Final diagnoses:  Left flank pain    Rx / DC Orders ED Discharge Orders          Ordered    ketorolac (TORADOL) 10 MG tablet  Every 8 hours PRN        12/03/22 1851              Tretha Sciara, MD 12/03/22 1859

## 2022-12-03 NOTE — ED Triage Notes (Signed)
Patient has left sided flank pain since last Thursday. Has blood in her urine. Lost appetite.

## 2022-12-03 NOTE — ED Notes (Signed)
Patient verbalizes understanding of discharge instructions. Opportunity for questioning and answers were provided. Armband removed by staff, pt discharged from ED. Wheeled out to lobby, awaiting ride home

## 2022-12-03 NOTE — ED Provider Triage Note (Signed)
Emergency Medicine Provider Triage Evaluation Note  Destiny Sharp , a 52 y.o. female  was evaluated in triage.  Pt complains of diffuse abdominal pain.  States that earlier this week she was seen at [facility] clinic diagnosed with a 3 mm left-sided UVJ nephrolithiasis.  Symptoms initially improved but then worsened again today.  She started having subjective fevers and worsening abdominal pain.  States she called her PCP who recommend she come to the emergency department for workup for potential diverticulitis on top of her nephrolithiasis patient endorses severe pain poor p.o. intake and ambulation..  Review of Systems  Positive: Left flank pain Negative:   Physical Exam  BP 107/63   Pulse (!) 111   Temp 98.3 F (36.8 C) (Oral)   Resp 16   Ht 5\' 2"  (1.575 m)   Wt 75 kg   LMP 05/22/2014 (Exact Date)   SpO2 98%   BMI 30.24 kg/m  Gen:   Awake, no distress   Resp:  Normal effort  MSK:   Moves extremities without difficulty  Other:    Medical Decision Making  Medically screening exam initiated at 5:26 PM.  Appropriate orders placed.  Destiny Sharp was informed that the remainder of the evaluation will be completed by another provider, this initial triage assessment does not replace that evaluation, and the importance of remaining in the ED until their evaluation is complete.     Tretha Sciara, MD 12/03/22 1726

## 2023-01-11 ENCOUNTER — Ambulatory Visit: Payer: BC Managed Care – PPO | Admitting: Occupational Therapy

## 2023-02-10 ENCOUNTER — Emergency Department (HOSPITAL_BASED_OUTPATIENT_CLINIC_OR_DEPARTMENT_OTHER): Payer: BC Managed Care – PPO | Admitting: Radiology

## 2023-02-10 ENCOUNTER — Encounter (HOSPITAL_BASED_OUTPATIENT_CLINIC_OR_DEPARTMENT_OTHER): Payer: Self-pay | Admitting: Emergency Medicine

## 2023-02-10 ENCOUNTER — Emergency Department (HOSPITAL_BASED_OUTPATIENT_CLINIC_OR_DEPARTMENT_OTHER)
Admission: EM | Admit: 2023-02-10 | Discharge: 2023-02-10 | Disposition: A | Discharge status: Home / Self Care | Payer: Worker's Compensation | Attending: Emergency Medicine | Admitting: Emergency Medicine

## 2023-02-10 ENCOUNTER — Other Ambulatory Visit: Payer: Self-pay

## 2023-02-10 DIAGNOSIS — S8011XA Contusion of right lower leg, initial encounter: Secondary | ICD-10-CM

## 2023-02-10 DIAGNOSIS — W010XXA Fall on same level from slipping, tripping and stumbling without subsequent striking against object, initial encounter: Secondary | ICD-10-CM | POA: Insufficient documentation

## 2023-02-10 DIAGNOSIS — M79642 Pain in left hand: Secondary | ICD-10-CM | POA: Diagnosis present

## 2023-02-10 DIAGNOSIS — W19XXXA Unspecified fall, initial encounter: Secondary | ICD-10-CM

## 2023-02-10 NOTE — Discharge Instructions (Addendum)
It was a pleasure taking care of you today!  There were no concerning findings noted today on your imaging studies.  The image of your right elbow showed concerns for a mild effusion but nothing is broken or dislocated. You may follow up in 7-10 days for a repeat xray if your symptoms are continuing.  It also appears to be bruising to the right shin again nothing is broken or dislocated at this time.  You may continue with the medications as recommended for you prior to your cataract surgery.  Continue to ice the areas and elevate them.  Follow-up with your care provider regarding today's ED visit.  Return to the emergency department if you are experiencing increasing/worsening symptoms.

## 2023-02-10 NOTE — ED Notes (Signed)
Pt given discharge instructions. Opportunities given for questions. Pt verbalizes understanding. Zya Finkle R, RN 

## 2023-02-10 NOTE — ED Provider Notes (Signed)
[place] EMERGENCY DEPARTMENT AT [facility] Provider Note   CSN: [id number] Arrival date & time: 02/10/23  1602     History  Chief Complaint  Patient presents with   Destiny Sharp is a 52 y.o. female who presents emergency department with concerns for fall onset prior to arrival.  Notes that she was walking on the bus when she tripped over a broom.  Has associated left hand pain, right elbow pain, left hip pain, right knee pain, right shin pain.  Also notes bruising to her right shin.  Denies unilateral swelling.  No meds tried prior to arrival.  She denies hitting her head or LOC.  Denies anticoagulant use at this time.  The history is provided by the patient. No language interpreter was used.       Home Medications Prior to Admission medications   Medication Sig Start Date End Date Taking? Authorizing Provider  Accu-Chek Softclix Lancets lancets  10/20/19   [provider]  albuterol (PROVENTIL HFA;VENTOLIN HFA) 108 (90 Base) MCG/ACT inhaler Inhale 2 puffs into the lungs every 4 (four) hours as needed for wheezing or shortness of breath. 12/06/17   Waymon Budge, MD  Blood Glucose Monitoring Suppl DEVI Use as directed once daily. Please dispense meter best covered by insurance. 10/18/19   [provider]  calcium carbonate (OS-CAL - DOSED IN MG OF ELEMENTAL CALCIUM) 1250 MG tablet Take 1 tablet by mouth daily.    [provider]  Cholecalciferol (VITAMIN D-3 PO) Take by mouth.    [provider]  DENTA 5000 PLUS 1.1 % CREA dental cream Place 1 application onto teeth at bedtime as needed (dental care).  05/03/14   [provider]  diclofenac Sodium (VOLTAREN) 1 % GEL Apply 4 g topically 4 (four) times daily. 03/22/22   Fondaw, Rodrigo Ran, PA  fluticasone (FLONASE) 50 MCG/ACT nasal spray Place 1 spray into both nostrils daily as needed for allergies.  01/11/14   [provider]  fluticasone furoate-vilanterol (BREO  ELLIPTA) 100-25 MCG/INH AEPB Inhale 1 puff then rinse mouth,once daily maintenance 10/23/19   Young, Joni Fears D, MD  Glucose Blood (BLOOD GLUCOSE TEST STRIPS) STRP Use as instructed once daily. 10/18/19   [provider]  HYDROcodone-acetaminophen (NORCO/VICODIN) 5-325 MG tablet Take 2 tablets by mouth every 4 (four) hours as needed. 11/10/22   Darrick Grinder, PA-C  ipratropium-albuterol (DUONEB) 0.5-2.5 (3) MG/3ML SOLN Take 3 mLs by nebulization every 6 (six) hours as needed (asthma).    [provider]  ketorolac (TORADOL) 10 MG tablet Take 1 tablet (10 mg total) by mouth every 8 (eight) hours as needed for severe pain. 12/03/22   Glyn Ade, MD  levothyroxine (SYNTHROID, LEVOTHROID) 50 MCG tablet Take 50 mcg by mouth daily before breakfast.     [provider]  meclizine (ANTIVERT) 25 MG tablet Take 1 tablet (25 mg total) by mouth 3 (three) times daily as needed for dizziness. 05/25/22   Rogerick Baldwin A, PA-C  metFORMIN (GLUCOPHAGE) 500 MG tablet Take 1 tablet by mouth in the morning and at bedtime. 06/19/20   [provider]  mirabegron ER (MYRBETRIQ) 50 MG TB24 tablet Take 50 mg by mouth daily.    [provider]  Multiple Vitamin (MULTIVITAMIN WITH MINERALS) TABS Take 1 tablet by mouth every morning.     [provider]  Nebulizers (COMPRESSOR/NEBULIZER) MISC Use as directed for asthma 02/03/13   Waymon Budge, MD  Omega-3  Fatty Acids (FISH OIL PO) Take 2,400 mg by mouth daily.    [provider]  ondansetron (ZOFRAN) 4 MG tablet Take 1 tablet (4 mg total) by mouth every 8 (eight) hours as needed for nausea or vomiting. 05/25/22   Mahitha Hickling A, PA-C  OVER THE COUNTER MEDICATION Place 1 application onto the skin as needed. Essential oil therapy Bennie Dallas, Stress Away    [provider]  predniSONE (STERAPRED UNI-PAK 21 TAB) 10 MG (21) TBPK tablet Waylan Rocher, MD 04/10/22   Marlyne Beards, MD   Spacer/Aero-Holding Deretha [facility] DIAMOND) MISC  01/27/13   [provider]      Allergies    Pneumococcal vaccine, Cephalexin, Codeine, Hepatitis b vaccine recomb (3-antigen), Hepatitis b virus vaccines, Imitrex [sumatriptan], and Pneumococcal vaccines    Review of Systems   Review of Systems  All other systems reviewed and are negative.   Physical Exam Updated Vital Signs BP (!) 147/89 (BP Location: Right Arm)   Pulse (!) 101   Temp 97.8 F (36.6 C) (Oral)   Resp 18   Ht 5\' 2"  (1.575 m)   Wt 74.8 kg   LMP 05/22/2014 (Exact Date)   SpO2 98%   BMI 30.18 kg/m  Physical Exam Vitals and nursing note reviewed.  Constitutional:      General: She is not in acute distress.    Appearance: Normal appearance.  Eyes:     General: No scleral icterus.    Extraocular Movements: Extraocular movements intact.  Cardiovascular:     Rate and Rhythm: Normal rate.  Pulmonary:     Effort: Pulmonary effort is normal. No respiratory distress.  Abdominal:     Palpations: Abdomen is soft. There is no mass.     Tenderness: There is no abdominal tenderness.  Musculoskeletal:        General: Normal range of motion.     Cervical back: Neck supple.     Comments: Contusion noted to right shin with tenderness to palpation noted to the area.  Mild tenderness to palpation noted diffusely to right knee.  No tenderness to palpation noted to right ankle/foot.  No spinal tenderness to palpation.  No tenderness to palpation noted to musculature of spine.  Patient with mild tenderness to palpation noted to left first digit with some bruising noted to the area.  Radial pulse intact.  Grip strength 5/5.  Tenderness to palpation noted to right olecranon with supination and pronation.  No tenderness to palpation noted to right forearm or right hand.  No tenderness to palpation noted to left olecranon.  Mild tenderness to palpation noted to left lateral hip with full range of motion.  Skin:     General: Skin is warm and dry.     Findings: No rash.  Neurological:     Mental Status: She is alert.     Sensory: Sensation is intact.     Motor: Motor function is intact.  Psychiatric:        Behavior: Behavior normal.     ED Results / Procedures / Treatments   Labs (all labs ordered are listed, but only abnormal results are displayed) Labs Reviewed - No data to display  EKG None  Radiology DG Elbow Complete Right  Result Date: 02/10/2023 CLINICAL DATA:  Pain after fall. EXAM: RIGHT ELBOW - COMPLETE 3+ VIEW COMPARISON:  None Available. FINDINGS: No acute fracture. No dislocation. The alignment is normal. There may be a small elbow joint effusion. Mild degenerative spurring. No erosive  change. Tissue edema seen about the dorsum of the proximal forearm. IMPRESSION: 1. Possible small joint effusion, however no fracture or dislocation is seen. If there is persistent clinical concern for fracture, recommend radiographic follow-up in 7-10 days. 2. Soft tissue edema. Electronically Signed   By: Narda Rutherford M.D.   On: 02/10/2023 17:44   DG Tibia/Fibula Right  Result Date: 02/10/2023 CLINICAL DATA:  Right leg pain after fall. EXAM: RIGHT TIBIA AND FIBULA - 2 VIEW COMPARISON:  None Available. FINDINGS: There is no evidence of fracture or other focal bone lesions. The cortical margins of the tibia and fibula are intact. Knee and ankle alignment are maintained. There is generalized soft tissue edema. IMPRESSION: Soft tissue edema.  No fracture of the right lower leg. Electronically Signed   By: Narda Rutherford M.D.   On: 02/10/2023 17:43   DG Hand Complete Left  Result Date: 02/10/2023 CLINICAL DATA:  Left hand pain after fall. EXAM: LEFT HAND - COMPLETE 3+ VIEW COMPARISON:  Remote radiograph 08/25/2010 FINDINGS: There is no evidence of fracture or dislocation. Joint spaces and alignment are maintained. There is no evidence of arthropathy or other focal bone abnormality. There may be mild soft  tissue edema overlying the metacarpal phalangeal joint. IMPRESSION: No fracture or dislocation of the left hand. Electronically Signed   By: Narda Rutherford M.D.   On: 02/10/2023 17:42   DG Hip Unilat W or Wo Pelvis 2-3 Views Left  Result Date: 02/10/2023 CLINICAL DATA:  Pain after fall. Left hip pain. EXAM: DG HIP (WITH OR WITHOUT PELVIS) 2-3V LEFT COMPARISON:  None Available. FINDINGS: No acute fracture of the pelvis or left hip. Femoral head is normally seated, no dislocation. The hip joint spaces preserved. There is trace lateral acetabular spurring. No evidence of erosion arthropathy. The pubic symphysis and sacroiliac joints are congruent. Unremarkable soft tissues. IMPRESSION: No acute fracture of the pelvis or left hip. Electronically Signed   By: Narda Rutherford M.D.   On: 02/10/2023 17:41   DG Knee Complete 4 Views Left  Result Date: 02/10/2023 CLINICAL DATA:  Pain after fall. EXAM: LEFT KNEE - COMPLETE 4+ VIEW COMPARISON:  None Available. FINDINGS: No evidence of fracture, dislocation, or joint effusion. Trace degenerative patellofemoral spurring. No other arthropathy. Soft tissues are unremarkable. IMPRESSION: No fracture or dislocation of the left knee. Electronically Signed   By: Narda Rutherford M.D.   On: 02/10/2023 17:40    Procedures Procedures    Medications Ordered in ED Medications - No data to display  ED Course/ Medical Decision Making/ A&P Clinical Course as of 02/10/23 1829  Wed Feb 10, 2023  1756 Re-evaluated and noted improvement of symptoms with treatment regimen. Discussed discharge treatment plan. Pt agreeable at this time. Pt appears safe for discharge. [SB]    Clinical Course User Index [SB] Monna Crean A, PA-C                             Medical Decision Making Amount and/or Complexity of Data Reviewed Radiology: ordered.   Pt with fall occurring PTA. Denies hitting their head, LOC, or anticoagulants. Vital signs pt afebrile. On exam, patient with  Contusion noted to right shin with tenderness to palpation noted to the area.  Mild tenderness to palpation noted diffusely to right knee.  No tenderness to palpation noted to right ankle/foot.  No spinal tenderness to palpation.  No tenderness to palpation noted to musculature of spine.  Patient with mild tenderness to palpation noted to left first digit with some bruising noted to the area.  Radial pulse intact.  Grip strength 5/5.  Tenderness to palpation noted to right olecranon with supination and pronation.  No tenderness to palpation noted to right forearm or right hand.  No tenderness to palpation noted to left olecranon.  Mild tenderness to palpation noted to left lateral hip with full range of motion. No acute cardiovascular, respiratory exam findings. Differential diagnosis includes fracture, dislocation, contusion.    Imaging: I ordered imaging studies including right elbow, right tib-fib, left hand, left hip, left knee x-ray I independently visualized and interpreted imaging which showed: No acute findings noted on left hip, left knee, left hand.  Right tib-fib x-ray with soft tissue swelling noted.  Right elbow x-ray with effusion no fracture or dislocation noted. I agree with the radiologist interpretation  Disposition: Presentation suspicious for contusion of right shin.  Doubt fracture, dislocation at this time.  Also notable for effusion.  After consideration of the diagnostic results and the patients response to treatment, I feel that the patient would benefit from Discharge home.  Work note provided.  Supportive care and return precautions discussed with patient. Appears safe for discharge at this time. Follow up as indicated in discharge paperwork.   This chart was dictated using voice recognition software, Dragon. Despite the best efforts of this provider to proofread and correct errors, errors may still occur which can change documentation meaning.   Final Clinical Impression(s)  / ED Diagnoses Final diagnoses:  Fall, initial encounter  Contusion of right lower leg, initial encounter    Rx / DC Orders ED Discharge Orders     None         Maalik Pinn A, PA-C 02/10/23 1832    Lonell Grandchild, MD 02/11/23 1240

## 2023-02-10 NOTE — ED Notes (Signed)
Pt c/o falling over a broom at work and hit shin. C/o 5/10. Ice applied. Small bruising noted.

## 2023-02-10 NOTE — ED Triage Notes (Signed)
Pt arrives to ED with c/o fall at work. Pt notes she fell on a bus. Pt reports left hand, bilateral elbows, left hip, right leg pain.

## 2023-02-22 ENCOUNTER — Ambulatory Visit: Payer: BC Managed Care – PPO | Attending: Physician Assistant | Admitting: Occupational Therapy

## 2023-02-22 ENCOUNTER — Other Ambulatory Visit: Payer: Self-pay

## 2023-02-22 DIAGNOSIS — M79641 Pain in right hand: Secondary | ICD-10-CM | POA: Insufficient documentation

## 2023-02-22 DIAGNOSIS — M6281 Muscle weakness (generalized): Secondary | ICD-10-CM | POA: Diagnosis present

## 2023-02-22 DIAGNOSIS — M25521 Pain in right elbow: Secondary | ICD-10-CM | POA: Insufficient documentation

## 2023-02-22 DIAGNOSIS — M25641 Stiffness of right hand, not elsewhere classified: Secondary | ICD-10-CM | POA: Diagnosis present

## 2023-02-22 NOTE — Therapy (Signed)
OUTPATIENT OCCUPATIONAL THERAPY ORTHO EVALUATION  Patient Name: Destiny Sharp MRN: [id number] DOB:[birth year], 52 y.o., female Today's Date: 02/23/2023  PCP: [facility] REFERRING PROVIDER: Azucena Fallen, PA-C  END OF SESSION:  OT End of Session - 02/22/23 1020     Visit Number 1    Number of Visits 13    Date for OT Re-Evaluation 04/19/23    Authorization Type BC[employer]    OT Start Time [id number]    OT Stop Time 1018    OT Time Calculation (min) 44 min             Past Medical History:  Diagnosis Date   Allergy    Anxiety    no meds   Asthma    Bronchitis    one or two times/year   Edema    LLE   Elevated liver function tests    Fibromyalgia    Headache(784.0)    otc med prn   Hx of cardiovascular stress test 11/22/2012   Lex MV 2/14: EF 81%, no ischemia   Hyperlipidemia    diet controlled - no med   Hypothyroidism    Pneumonia    hx   PONV (postoperative nausea and vomiting)    Rheumatoid arthritis (HCC) 2023   Scoliosis    history   Sleep apnea    Does not use CPAP every night   Vasovagal syncope    one episode only   Past Surgical History:  Procedure Laterality Date   ABDOMINAL HYSTERECTOMY     APPENDECTOMY     CYSTOSCOPY  05/22/2014   Procedure: CYSTOSCOPY;  Surgeon: Jacqualin Combes de Gwenevere Ghazi, MD;  Location: [facility];  Service: Gynecology;;   MOUTH SURGERY     wisdom teeth ext   NASAL SINUS SURGERY     x 3   ROBOTIC ASSISTED LAPAROSCOPIC LYSIS OF ADHESION Bilateral 05/22/2014   Procedure: XI ROBOTIC ASSISTED LAPAROSCOPIC LYSIS OF extensive ADHESION;  Surgeon: Jacqualin Combes de Gwenevere Ghazi, MD;  Location: [facility];  Service: Gynecology;  Laterality: Bilateral;   ROBOTIC ASSISTED TOTAL HYSTERECTOMY WITH BILATERAL SALPINGO OOPHERECTOMY N/A 05/22/2014   Procedure: ROBOTIC ASSISTED TOTAL HYSTERECTOMY WITH BILATERAL SALPINGO OOPHORECTOMY and cystoscopy;  Surgeon: Jacqualin Combes de Gwenevere Ghazi, MD;  Location: [facility]  [facility];  Service: Gynecology;  Laterality: N/A;   Patient Active Problem List   Diagnosis Date Noted   Lateral epicondylitis, right elbow 03/24/2022   Pain in right hand 06/24/2021   Seasonal and perennial allergic rhinitis 07/07/2014   Status post laparoscopic hysterectomy 05/22/2014   Vasovagal attack 03/07/2014   Abnormal CT of the chest 10/30/2012   Chest pain 10/07/2012   Hypothyroidism    Hypertension    Allergic asthma, mild intermittent, uncomplicated    Fatigue    Edema     ONSET DATE: diagnosis Sept 2023  REFERRING DIAG: M25.50 (ICD-10-CM) - Pain in unspecified joint M06.09 (ICD-10-CM) - Rheumatoid arthritis without rheumatoid factor, multiple sites 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  Pt reports pain in elbow is greater than hand.  Pt expressing desire to know more about what she can do to not increase the pain while keeping the movement.  Pt  reports sometimes brushing hair can be painful. Some days I cannot pick up more than a couple of pounds, at they are cleaning out family member's home.  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 Pt accompanied by: self  PERTINENT HISTORY: RA, seasonal allergies, obstructive sleep apnea, hypothyroidism, HTN; Tenderness to palpation noted to right olecranon with supination and pronation.   PRECAUTIONS: Fall  WEIGHT BEARING RESTRICTIONS: No  PAIN:  Are you having pain? No  FALLS: Has patient fallen in last 6 months? Yes. Number of falls 1, fell over the broom on the bus  LIVING ENVIRONMENT: Lives with:  is caregiver for her elderly father Lives in:  House/apartment Stairs: Yes: Internal: full flight of steps to the basement, but rarely needs to go down these steps; on left going up Has following equipment at home: shower chair and Grab bars  PLOF: Independent, Independent with basic ADLs, and Vocation/Vocational requirements: bus driver  PATIENT GOALS: to get better use of my hands in daily life  NEXT MD VISIT: no scheduled appt  OBJECTIVE:   HAND DOMINANCE: Right  ADLs: Eating: difficulty with opening jars/containers Grooming: painful with brushing hair UB Dressing: difficulty with buttons, fastening bra - depends on pain, swelling, and stiffness in hands/joints Equipment: Shower seat with back, Grab bars, Walk in shower, and Reacher  FUNCTIONAL OUTCOME MEASURES: Quick Dash: 29.5%; 38.3% with work portion  UPPER EXTREMITY ROM:     Active ROM Right eval Left eval  Shoulder flexion 128 (grimacing) 130  Shoulder abduction    Shoulder adduction    Shoulder extension    Shoulder internal rotation    Shoulder external rotation    Elbow flexion 139 (grimacing) 144  Elbow extension 0 0  Wrist flexion 84 83  Wrist extension 58 60  Wrist ulnar deviation    Wrist radial deviation    Wrist pronation    Wrist supination    (Blank rows = not tested)   HAND FUNCTION: Grip and pinch strength TBD  COORDINATION: Box and Blocks:  Right 45 blocks, Left 51 blocks (Pt with massage at R elbow after 35 seconds and grimacing in last 10 seconds)  SENSATION: WFL  EDEMA: mild edema in R elbow and hand (at times), not present on eval  COGNITION: Overall cognitive status: Within functional limits for tasks assessed   TODAY'S TREATMENT:                                                                                                                              02/22/23 Provided with joint protection handout from OT toolkit.  Discussed modifying hand placement and utilizing larger joints when carrying or stabilizing items.   Encouraged pt to read handout at home and return with questions at next session.   PATIENT EDUCATION: Education details: Educated on role and purpose of OT as well as potential interventions and goals for therapy based on initial  evaluation findings.  Joint protection principles Person educated: Patient Education method: Explanation and Handouts Education comprehension: verbalized understanding  HOME EXERCISE PROGRAM: TBD  GOALS: Goals reviewed with patient? No  SHORT TERM GOALS: Target date: 03/22/23  Pt will be independent in gentle ROM exercises for pain management. Baseline: Goal status: INITIAL  2.  Pt will verbalize understanding of use of modalities to decrease inflammation/pain.  Baseline:  Goal status: INITIAL  3.  Pt will verbalize understanding of task modifications and/or potential AE needs to increase ease, safety, and independence with IADLs.  Baseline:  Goal status: INITIAL    LONG TERM GOALS: Target date: 04/19/23  Pt will demonstrate improved UE functional use for ADLs as evidenced by increasing box/ blocks score by 5 blocks with RUE. Baseline: Right 45 blocks, Left 51 blocks Goal status: INITIAL  2.  Pt will report decreased pain in R arm/hand during activities requiring repetitive movements to less than 5 on pain scale.  Baseline:  Goal status: INITIAL  3.  Pt will tolerate repetitive motion against min resistance to engage in ADLs/IADLs for 5 mins (brushing hair, washing dishes, unloading groceries, etc) without increasing pain > 2 points on 10 point scale. Baseline:  Goal status: INITIAL  4.  Pt will report improved functional use of RUE as evidenced by improved score on Quick DASH to 20% impairment or less. Baseline: 29.5%; 38.3% with work portion Goal status: INITIAL   ASSESSMENT:  CLINICAL IMPRESSION: Patient is a 52 y.o. female who was seen today for occupational therapy evaluation for pain in dominant RUE, largely impacted by RA. Pt reports  ongoing pain in RUE, limiting ability to engage in ADLs, IADLs, and work tasks due to weakness and pain.  MD has trialed a variety of medications with some relief, however wanting to focus on therapy to strengthen and obtain strategies to decrease pain and overuse. Pt's father lives with her, for whom she is a caregiver.  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  Pt will benefit from skilled occupational therapy services to address strength and coordination, ROM, pain management, GM/FM control, safety awareness, introduction of compensatory strategies/AE prn, and implementation of an HEP to improve participation and safety during ADLs, IADLs, and work performance.    PERFORMANCE DEFICITS: in functional skills including ADLs, IADLs, coordination, dexterity, edema, ROM, strength, pain, flexibility, Fine motor control, Gross motor control, endurance, decreased knowledge of precautions, decreased knowledge of use of DME, and UE functional use and psychosocial skills including coping strategies and environmental adaptation.   IMPAIRMENTS: are limiting patient from ADLs, IADLs, and work.   COMORBIDITIES: may have co-morbidities  that affects occupational performance. Patient will benefit from skilled OT to address above impairments and improve overall function.  MODIFICATION OR ASSISTANCE TO COMPLETE EVALUATION: Min-Moderate modification of tasks or assist with assess necessary to complete an evaluation.  OT OCCUPATIONAL PROFILE AND HISTORY: Detailed assessment: Review of records and additional review of physical, cognitive, psychosocial history related to current functional performance.  CLINICAL DECISION MAKING: Moderate - several treatment options, min-mod task modification necessary  REHAB POTENTIAL: Good  EVALUATION COMPLEXITY: Moderate      PLAN:  OT FREQUENCY: 1-2x/week  OT DURATION: 8 weeks  PLANNED INTERVENTIONS: self care/ADL training, therapeutic exercise,  therapeutic activity, neuromuscular re-education, manual therapy, passive range of motion, splinting, electrical stimulation, ultrasound, iontophoresis, paraffin, compression bandaging, moist heat, cryotherapy, contrast bath, patient/family education, psychosocial skills training, energy conservation, coping strategies training, and DME and/or AE instructions  RECOMMENDED OTHER SERVICES: N/A  CONSULTED AND AGREED WITH PLAN OF CARE: Patient  PLAN FOR NEXT SESSION: Review joint protection principles, initiate ROM/massage/strengthening as able, use of modalities (heat, cold, ultrasound)   Antionne Enrique, OTR/L 02/23/2023, 9:25 AM

## 2023-02-25 ENCOUNTER — Ambulatory Visit: Payer: BC Managed Care – PPO | Admitting: Occupational Therapy

## 2023-02-25 DIAGNOSIS — M25521 Pain in right elbow: Secondary | ICD-10-CM

## 2023-02-25 DIAGNOSIS — M79641 Pain in right hand: Secondary | ICD-10-CM

## 2023-02-25 DIAGNOSIS — M6281 Muscle weakness (generalized): Secondary | ICD-10-CM

## 2023-02-25 NOTE — Therapy (Signed)
OUTPATIENT OCCUPATIONAL THERAPY ORTHO  Treatment Note  Patient Name: Destiny Sharp MRN: [id number] DOB:[birth year], 52 y.o., female Today's Date: 02/25/2023  PCP: [facility] REFERRING PROVIDER: Azucena Fallen, PA-C  END OF SESSION:  OT End of Session - 02/25/23 0921     Visit Number 2    Number of Visits 13    Date for OT Re-Evaluation 04/19/23    Authorization Type BCBS    OT Start Time [id number]    OT Stop Time 0930    OT Time Calculation (min) 38 min              Past Medical History:  Diagnosis Date   Allergy    Anxiety    no meds   Asthma    Bronchitis    one or two times/year   Edema    LLE   Elevated liver function tests    Fibromyalgia    Headache(784.0)    otc med prn   Hx of cardiovascular stress test 11/22/2012   Lex MV 2/14: EF 81%, no ischemia   Hyperlipidemia    diet controlled - no med   Hypothyroidism    Pneumonia    hx   PONV (postoperative nausea and vomiting)    Rheumatoid arthritis (HCC) 2023   Scoliosis    history   Sleep apnea    Does not use CPAP every night   Vasovagal syncope    one episode only   Past Surgical History:  Procedure Laterality Date   ABDOMINAL HYSTERECTOMY     APPENDECTOMY     CYSTOSCOPY  05/22/2014   Procedure: CYSTOSCOPY;  Surgeon: Jacqualin Combes de Gwenevere Ghazi, MD;  Location: [facility];  Service: Gynecology;;   MOUTH SURGERY     wisdom teeth ext   NASAL SINUS SURGERY     x 3   ROBOTIC ASSISTED LAPAROSCOPIC LYSIS OF ADHESION Bilateral 05/22/2014   Procedure: XI ROBOTIC ASSISTED LAPAROSCOPIC LYSIS OF extensive ADHESION;  Surgeon: Jacqualin Combes de Gwenevere Ghazi, MD;  Location: [facility];  Service: Gynecology;  Laterality: Bilateral;   ROBOTIC ASSISTED TOTAL HYSTERECTOMY WITH BILATERAL SALPINGO OOPHERECTOMY N/A 05/22/2014   Procedure: ROBOTIC ASSISTED TOTAL HYSTERECTOMY WITH BILATERAL SALPINGO OOPHORECTOMY and cystoscopy;  Surgeon: Jacqualin Combes de Gwenevere Ghazi, MD;   Location: [facility];  Service: Gynecology;  Laterality: N/A;   Patient Active Problem List   Diagnosis Date Noted   Lateral epicondylitis, right elbow 03/24/2022   Pain in right hand 06/24/2021   Seasonal and perennial allergic rhinitis 07/07/2014   Status post laparoscopic hysterectomy 05/22/2014   Vasovagal attack 03/07/2014   Abnormal CT of the chest 10/30/2012   Chest pain 10/07/2012   Hypothyroidism    Hypertension    Allergic asthma, mild intermittent, uncomplicated    Fatigue    Edema     ONSET DATE: diagnosis Sept 2023  REFERRING DIAG: M25.50 (ICD-10-CM) - Pain in unspecified joint M06.09 (ICD-10-CM) - Rheumatoid arthritis without rheumatoid factor, multiple sites M06.4 (ICD-10-CM) - Inflammatory polyarthropathy  THERAPY DIAG:  Pain in right elbow  Pain in right hand  Muscle weakness (generalized)  Rationale for Evaluation and Treatment: Rehabilitation  SUBJECTIVE:   SUBJECTIVE STATEMENT: Pt reports that she overworked her arm yesterday.  She was helping clean out her aunt's estate, requiring repetitive movements and reaching.   Pt accompanied by: self  PERTINENT HISTORY: RA, seasonal allergies, obstructive sleep apnea, hypothyroidism, HTN; Tenderness to palpation noted to right olecranon with supination and  pronation.   PRECAUTIONS: Fall  WEIGHT BEARING RESTRICTIONS: No  PAIN:  Are you having pain? Yes: NPRS scale: 3/10 Pain location: R elbow and forearm Pain description: sore, aching Aggravating factors: repetitive movement Relieving factors: rest  FALLS: Has patient fallen in last 6 months? Yes. Number of falls 1, fell over the broom on the bus  LIVING ENVIRONMENT: Lives with:  is caregiver for her elderly father Lives in: House/apartment Stairs: Yes: Internal: full flight of steps to the basement, but rarely needs to go down these steps; on left going up Has following equipment at home: shower chair and Grab bars  PLOF: Independent, Independent with  basic ADLs, and Vocation/Vocational requirements: bus driver  PATIENT GOALS: to get better use of my hands in daily life  NEXT MD VISIT: no scheduled appt  OBJECTIVE:   HAND DOMINANCE: Right  ADLs: Eating: difficulty with opening jars/containers Grooming: painful with brushing hair UB Dressing: difficulty with buttons, fastening bra - depends on pain, swelling, and stiffness in hands/joints Equipment: Shower seat with back, Grab bars, Walk in shower, and Reacher  FUNCTIONAL OUTCOME MEASURES: Quick Dash: 29.5%; 38.3% with work portion  UPPER EXTREMITY ROM:     Active ROM Right eval Left eval  Shoulder flexion 128 (grimacing) 130  Shoulder abduction    Shoulder adduction    Shoulder extension    Shoulder internal rotation    Shoulder external rotation    Elbow flexion 139 (grimacing) 144  Elbow extension 0 0  Wrist flexion 84 83  Wrist extension 58 60  Wrist ulnar deviation    Wrist radial deviation    Wrist pronation    Wrist supination    (Blank rows = not tested)   HAND FUNCTION: Grip and pinch strength TBD  COORDINATION: Box and Blocks:  Right 45 blocks, Left 51 blocks (Pt with massage at R elbow after 35 seconds and grimacing in last 10 seconds)  SENSATION: WFL  EDEMA: mild edema in R elbow and hand (at times), not present on eval  COGNITION: Overall cognitive status: Within functional limits for tasks assessed   TODAY'S TREATMENT:                                                                    02/25/23 Reviewed joint protection principles in regards to repetitive movements and use of nondominant LUE with routine/daily tasks Gentle soft tissue mobilization w/ lotion to posterior forearm and origin of common extensor muscle mass in order to facilitate healing within tissue structures and decrease tension and pain. Pt positioned w/ forearm resting on pillow on tabletop surface and tolerated intervention w/out pain, though tolerable level of discomfort  reported to palpation around common extensor tendon origin. Corresponding education provided on self-massage in clockwise, counter-clockwise, perpendicular, and parallel directions w/ pt returning demonstration. ROM: OT provided demonstration and instruction on simple gentle AROM. Recommended completion 3-4x/day and application of ice afterwards.  Pt demonstrating good technique with each, reporting mild discomfort in elbow with each movement but tolerable. Exercises - Seated Elbow Flexion and Extension AROM  - 3-4 x daily - 5 reps - Seated Forearm Pronation and Supination AROM  - 3-4 x daily - 5 reps - Wrist AROM Flexion Extension  - 3-4 x daily - 5 reps -  Wrist AROM Radial Ulnar Deviation  - 3-4 x daily - 5 reps   02/22/23 Provided with joint protection handout from OT toolkit.  Discussed modifying hand placement and utilizing larger joints when carrying or stabilizing items.  Encouraged pt to read handout at home and return with questions at next session.   PATIENT EDUCATION: Education details: ongoing condition specific education Person educated: Patient Education method: Chief Technology Officer Education comprehension: verbalized understanding  HOME EXERCISE PROGRAM: Access Code: [email or web address] URL: [email or web address] Date: 02/25/2023 Prepared by: [facility] - Outpatient  Rehab - [facility]   GOALS: Goals reviewed with patient? No  SHORT TERM GOALS: Target date: 03/22/23  Pt will be independent in gentle ROM exercises for pain management. Baseline: Goal status: IN PROGRESS  2.  Pt will verbalize understanding of use of modalities to decrease inflammation/pain.  Baseline:  Goal status: IN PROGRESS  3.  Pt will verbalize understanding of task modifications and/or potential AE needs to increase ease, safety, and independence with IADLs.  Baseline:  Goal status: IN PROGRESS    LONG TERM GOALS: Target date: 04/19/23  Pt will demonstrate improved UE  functional use for ADLs as evidenced by increasing box/ blocks score by 5 blocks with RUE. Baseline: Right 45 blocks, Left 51 blocks Goal status: IN PROGRESS  2.  Pt will report decreased pain in R arm/hand during activities requiring repetitive movements to less than 5 on pain scale.  Baseline:  Goal status: IN PROGRESS  3.  Pt will tolerate repetitive motion against min resistance to engage in ADLs/IADLs for 5 mins (brushing hair, washing dishes, unloading groceries, etc) without increasing pain > 2 points on 10 point scale. Baseline:  Goal status: IN PROGRESS  4.  Pt will report improved functional use of RUE as evidenced by improved score on Quick DASH to 20% impairment or less. Baseline: 29.5%; 38.3% with work portion Goal status: IN PROGRESS   ASSESSMENT:  CLINICAL IMPRESSION: Pt reporting understanding of recommendations in regards to joint protection principles, however limited due to currently assisting with cleaning out her aunt's estate with other family members.  Pt has decreased lifting and repetitive movements with scrubbing, but still over using UE due to nature of cleaning out the house.  Pt understanding of education on massage and use of ice for pain relief post exercises and repetitive use.  OT explained benefit of rest as able to decrease pain largely due to overuse and stress from job.  PERFORMANCE DEFICITS: in functional skills including ADLs, IADLs, coordination, dexterity, edema, ROM, strength, pain, flexibility, Fine motor control, Gross motor control, endurance, decreased knowledge of precautions, decreased knowledge of use of DME, and UE functional use and psychosocial skills including coping strategies and environmental adaptation.   IMPAIRMENTS: are limiting patient from ADLs, IADLs, and work.   COMORBIDITIES: may have co-morbidities  that affects occupational performance. Patient will benefit from skilled OT to address above impairments and improve overall  function.  MODIFICATION OR ASSISTANCE TO COMPLETE EVALUATION: Min-Moderate modification of tasks or assist with assess necessary to complete an evaluation.  OT OCCUPATIONAL PROFILE AND HISTORY: Detailed assessment: Review of records and additional review of physical, cognitive, psychosocial history related to current functional performance.  CLINICAL DECISION MAKING: Moderate - several treatment options, min-mod task modification necessary  REHAB POTENTIAL: Good  EVALUATION COMPLEXITY: Moderate      PLAN:  OT FREQUENCY: 1-2x/week  OT DURATION: 8 weeks  PLANNED INTERVENTIONS: self care/ADL training, therapeutic exercise, therapeutic activity, neuromuscular re-education, manual therapy, passive range  of motion, splinting, electrical stimulation, ultrasound, iontophoresis, paraffin, compression bandaging, moist heat, cryotherapy, contrast bath, patient/family education, psychosocial skills training, energy conservation, coping strategies training, and DME and/or AE instructions  RECOMMENDED OTHER SERVICES: N/A  CONSULTED AND AGREED WITH PLAN OF CARE: Patient  PLAN FOR NEXT SESSION: Review joint protection principles, review ROM/massage/strengthening as able, use of modalities (heat, cold, ultrasound).  Assess strength at next session.   Rosalio Loud, OTR/L 02/25/2023, 9:40 AM

## 2023-03-02 NOTE — Therapy (Signed)
OUTPATIENT OCCUPATIONAL THERAPY ORTHO  Treatment Note  Patient Name: Destiny Sharp MRN: [id number] DOB:11-20-1970, 52 y.o., female Today's Date: 03/03/2023  PCP: [facility] REFERRING PROVIDER: Azucena Fallen, PA-C  END OF SESSION:  OT End of Session - 03/03/23 0806     Visit Number 3    Number of Visits 13    Date for OT Re-Evaluation 04/19/23    Authorization Type BCBS    OT Start Time 0807    OT Stop Time 0852    OT Time Calculation (min) 45 min              Past Medical History:  Diagnosis Date   Allergy    Anxiety    no meds   Asthma    Bronchitis    one or two times/year   Edema    LLE   Elevated liver function tests    Fibromyalgia    Headache(784.0)    otc med prn   Hx of cardiovascular stress test 11/22/2012   Lex MV 2/14: EF 81%, no ischemia   Hyperlipidemia    diet controlled - no med   Hypothyroidism    Pneumonia    hx   PONV (postoperative nausea and vomiting)    Rheumatoid arthritis (HCC) 2023   Scoliosis    history   Sleep apnea    Does not use CPAP every night   Vasovagal syncope    one episode only   Past Surgical History:  Procedure Laterality Date   ABDOMINAL HYSTERECTOMY     APPENDECTOMY     CYSTOSCOPY  05/22/2014   Procedure: CYSTOSCOPY;  Surgeon: Jacqualin Combes de Gwenevere Ghazi, MD;  Location: [facility];  Service: Gynecology;;   MOUTH SURGERY     wisdom teeth ext   NASAL SINUS SURGERY     x 3   ROBOTIC ASSISTED LAPAROSCOPIC LYSIS OF ADHESION Bilateral 05/22/2014   Procedure: XI ROBOTIC ASSISTED LAPAROSCOPIC LYSIS OF extensive ADHESION;  Surgeon: Jacqualin Combes de Gwenevere Ghazi, MD;  Location: [facility];  Service: Gynecology;  Laterality: Bilateral;   ROBOTIC ASSISTED TOTAL HYSTERECTOMY WITH BILATERAL SALPINGO OOPHERECTOMY N/A 05/22/2014   Procedure: ROBOTIC ASSISTED TOTAL HYSTERECTOMY WITH BILATERAL SALPINGO OOPHORECTOMY and cystoscopy;  Surgeon: Jacqualin Combes de Gwenevere Ghazi, MD;   Location: [facility];  Service: Gynecology;  Laterality: N/A;   Patient Active Problem List   Diagnosis Date Noted   Lateral epicondylitis, right elbow 03/24/2022   Pain in right hand 06/24/2021   Seasonal and perennial allergic rhinitis 07/07/2014   Status post laparoscopic hysterectomy 05/22/2014   Vasovagal attack 03/07/2014   Abnormal CT of the chest 10/30/2012   Chest pain 10/07/2012   Hypothyroidism    Hypertension    Allergic asthma, mild intermittent, uncomplicated    Fatigue    Edema     ONSET DATE: diagnosis Sept 2023  REFERRING DIAG: M25.50 (ICD-10-CM) - Pain in unspecified joint M06.09 (ICD-10-CM) - Rheumatoid arthritis without rheumatoid factor, multiple sites M06.4 (ICD-10-CM) - Inflammatory polyarthropathy  THERAPY DIAG:  Pain in right elbow  Pain in right hand  Muscle weakness (generalized)  Stiffness of right hand, not elsewhere classified  Rationale for Evaluation and Treatment: Rehabilitation  PERTINENT HISTORY: RA, seasonal allergies, obstructive sleep apnea, hypothyroidism, HTN; Tenderness to palpation noted to right olecranon with supination and pronation.   PRECAUTIONS: Fall; WEIGHT BEARING RESTRICTIONS: No   SUBJECTIVE:   SUBJECTIVE STATEMENT: Pt arrives a bit late, states her Lt lateral elbow  and Lt dorsal wrist are most sore now- this presents as obvoous "tennis elbow."  Rt medial elbow is somewhat sore as well as MCP Js of Lt hand.    PAIN:  Are you having pain?    Yes: NPRS scale: 4-5/10 Pain location: R elbow and forearm Pain description: sore, aching Aggravating factors: repetitive movement Relieving factors: rest  FALLS: Has patient fallen in last 6 months? Yes. Number of falls 1, fell over the broom on the bus  LIVING ENVIRONMENT: Lives with:  is caregiver for her elderly father Lives in: House/apartment Stairs: Yes: Internal: full flight of steps to the basement, but rarely needs to go down these steps; on left going up Has  following equipment at home: shower chair and Grab bars  PLOF: Independent, Independent with basic ADLs, and Vocation/Vocational requirements: bus driver  PATIENT GOALS: to get better use of my hands in daily life     OBJECTIVE:   HAND DOMINANCE: Right  ADLs: Eating: difficulty with opening jars/containers Grooming: painful with brushing hair UB Dressing: difficulty with buttons, fastening bra - depends on pain, swelling, and stiffness in hands/joints Equipment: Shower seat with back, Grab bars, Walk in shower, and Reacher  FUNCTIONAL OUTCOME MEASURES: Quick Dash: 29.5%; 38.3% with work portion  UPPER EXTREMITY ROM:     Active ROM Right eval Left eval  Shoulder flexion 128 (grimacing) 130  Shoulder abduction    Shoulder adduction    Shoulder extension    Shoulder internal rotation    Shoulder external rotation    Elbow flexion 139 (grimacing) 144  Elbow extension 0 0  Wrist flexion 84 83  Wrist extension 58 60  Wrist ulnar deviation    Wrist radial deviation    Wrist pronation    Wrist supination    (Blank rows = not tested)   HAND FUNCTION: TBD  COORDINATION: Box and Blocks:  Right 45 blocks, Left 51 blocks (Pt with massage at R elbow after 35 seconds and grimacing in last 10 seconds)  SENSATION: WFL  EDEMA: mild edema in R elbow and hand (at times), not present on eval   TODAY'S TREATMENT:                                                                    03/03/23: She has a positive resisted wrist extension test, tenderness about the lateral epicondyle, tenderness through the extensors and seems to obviously present as a lateral epicondylitis on the left arm.  She has some overuse syndrome to the right arm due to this and states disuse to the left arm chronically and has a mild medial epicondylitis in the right arm.  In both hands, she has some swelling and tenderness at the MCP joints which presents more like her RA or osteoarthritis.  OT educates her on  these things and the importance of managing different problems in different ways.  She is given a new and updated HEP specifically for left arm lateral epicondylitis today, OT also applies moist heat to her arm for 3 minutes while explaining it.  Additionally OT does IASTM manual therapy around the tricep and lateral epicondyle there is a dorsal extensors which she states feels like a healthy stretch.  Next OT applies Kinesiotape to support supination  to support wrist extension and to support tricep extension thereby taking pressure off the lateral epicondyle.  She states this feels helpful and reduces her pain.  There is not much time at the end of the session to have her perform her stretches but she was cautioned to do them nonpainfully and very lightly for long stretches at least 3 times a day.  She was advised to avoid lifting with her pronated position elbow straight and wrist cocked back (provocative for lateral epicondylitis) and advised to not lift anything heavy but when she has to lift do so in a palm up position.   Exercises - Tricep Stretch- DO SEATED BY TABLE  - 3-4 x daily - 3-5 reps - 15 hold - Forearm Pronation Stretch  - 3-4 x daily - 3-5 reps - 15 sec hold - Wrist Flexion Stretch  - 4 x daily - 3-5 reps - 15 sec hold - Wrist Prayer Stretch  - 4 x daily - 3-5 reps - 15 sec hold   02/25/23 Reviewed joint protection principles in regards to repetitive movements and use of nondominant LUE with routine/daily tasks Gentle soft tissue mobilization w/ lotion to posterior forearm and origin of common extensor muscle mass in order to facilitate healing within tissue structures and decrease tension and pain. Pt positioned w/ forearm resting on pillow on tabletop surface and tolerated intervention w/out pain, though tolerable level of discomfort reported to palpation around common extensor tendon origin. Corresponding education provided on self-massage in clockwise, counter-clockwise,  perpendicular, and parallel directions w/ pt returning demonstration. ROM: OT provided demonstration and instruction on simple gentle AROM. Recommended completion 3-4x/day and application of ice afterwards.  Pt demonstrating good technique with each, reporting mild discomfort in elbow with each movement but tolerable. Exercises - Seated Elbow Flexion and Extension AROM  - 3-4 x daily - 5 reps - Seated Forearm Pronation and Supination AROM  - 3-4 x daily - 5 reps - Wrist AROM Flexion Extension  - 3-4 x daily - 5 reps - Wrist AROM Radial Ulnar Deviation  - 3-4 x daily - 5 reps   02/22/23 Provided with joint protection handout from OT toolkit.  Discussed modifying hand placement and utilizing larger joints when carrying or stabilizing items.  Encouraged pt to read handout at home and return with questions at next session.   PATIENT EDUCATION: Education details: ongoing condition specific education Person educated: Patient Education method: Chief Technology Officer Education comprehension: verbalized understanding  HOME EXERCISE PROGRAM: Access Code: [email or web address] URL: [email or web address] Date: 02/25/2023 Prepared by: [facility] - Outpatient  Rehab - [facility]   GOALS: Goals reviewed with patient? No  SHORT TERM GOALS: Target date: 03/22/23  Pt will be independent in gentle ROM exercises for pain management. Baseline: Goal status: IN PROGRESS  2.  Pt will verbalize understanding of use of modalities to decrease inflammation/pain.  Baseline:  Goal status: IN PROGRESS  3.  Pt will verbalize understanding of task modifications and/or potential AE needs to increase ease, safety, and independence with IADLs.  Baseline:  Goal status: IN PROGRESS    LONG TERM GOALS: Target date: 04/19/23  Pt will demonstrate improved UE functional use for ADLs as evidenced by increasing box/ blocks score by 5 blocks with RUE. Baseline: Right 45 blocks, Left 51 blocks Goal status:  IN PROGRESS  2.  Pt will report decreased pain in R arm/hand during activities requiring repetitive movements to less than 5 on pain scale.  Baseline:  Goal status: IN PROGRESS  3.  Pt will tolerate repetitive motion against min resistance to engage in ADLs/IADLs for 5 mins (brushing hair, washing dishes, unloading groceries, etc) without increasing pain > 2 points on 10 point scale. Baseline:  Goal status: IN PROGRESS  4.  Pt will report improved functional use of RUE as evidenced by improved score on Quick DASH to 20% impairment or less. 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 Continue on with tx for Lt elbow as a primary, as that is her chief concern now.    02/25/23: Pt reporting understanding of recommendations in regards to joint protection principles, however limited due to currently assisting with cleaning out her aunt's estate with other family members.  Pt has decreased lifting and repetitive movements with scrubbing, but still over using UE due to nature of cleaning out the house.  Pt understanding of education on massage and use of ice for pain relief post exercises and repetitive use.  OT explained benefit of rest as able to decrease pain largely due to overuse and stress from job.      PLAN:  OT FREQUENCY: 1-2x/week  OT DURATION: 8 weeks  PLANNED INTERVENTIONS: self care/ADL training, therapeutic exercise, therapeutic activity, neuromuscular re-education, manual therapy, passive range of motion, splinting, electrical stimulation, ultrasound, iontophoresis, paraffin, compression bandaging, moist heat, cryotherapy, contrast bath, patient/family education, psychosocial skills training, energy conservation, coping strategies training, and DME and/or AE  instructions   CONSULTED AND AGREED WITH PLAN OF CARE: Patient  PLAN FOR NEXT SESSION:   Continue on trying to manage left lateral epicondylitis.  Gentle finger stretches especially at the MCP joints can be reviewed and performed as well.  Use modalities and manual therapy as helpful   Fannie Knee, OTR/L 03/03/2023, 10:20 AM

## 2023-03-03 ENCOUNTER — Encounter: Payer: Self-pay | Admitting: Rehabilitative and Restorative Service Providers"

## 2023-03-03 ENCOUNTER — Ambulatory Visit: Payer: BC Managed Care – PPO | Admitting: Rehabilitative and Restorative Service Providers"

## 2023-03-03 DIAGNOSIS — M25521 Pain in right elbow: Secondary | ICD-10-CM | POA: Diagnosis not present

## 2023-03-03 DIAGNOSIS — M79641 Pain in right hand: Secondary | ICD-10-CM

## 2023-03-03 DIAGNOSIS — M25641 Stiffness of right hand, not elsewhere classified: Secondary | ICD-10-CM

## 2023-03-03 DIAGNOSIS — M6281 Muscle weakness (generalized): Secondary | ICD-10-CM

## 2023-03-09 ENCOUNTER — Ambulatory Visit: Payer: BC Managed Care – PPO | Attending: Physician Assistant | Admitting: Occupational Therapy

## 2023-03-09 DIAGNOSIS — M25641 Stiffness of right hand, not elsewhere classified: Secondary | ICD-10-CM | POA: Diagnosis present

## 2023-03-09 DIAGNOSIS — M25521 Pain in right elbow: Secondary | ICD-10-CM | POA: Insufficient documentation

## 2023-03-09 DIAGNOSIS — M79641 Pain in right hand: Secondary | ICD-10-CM | POA: Insufficient documentation

## 2023-03-09 DIAGNOSIS — M6281 Muscle weakness (generalized): Secondary | ICD-10-CM | POA: Diagnosis present

## 2023-03-09 NOTE — Therapy (Signed)
OUTPATIENT OCCUPATIONAL THERAPY ORTHO  Treatment Note  Patient Name: Destiny Sharp MRN: [id number] DOB:08-30-1971, 52 y.o., female Today's Date: 03/09/2023  PCP: [facility] REFERRING PROVIDER: Azucena Fallen, PA-C  END OF SESSION:  OT End of Session - 03/09/23 0812     Visit Number 4    Number of Visits 13    Date for OT Re-Evaluation 04/19/23    Authorization Type BCBS    OT Start Time 0802    OT Stop Time 0845    OT Time Calculation (min) 43 min               Past Medical History:  Diagnosis Date   Allergy    Anxiety    no meds   Asthma    Bronchitis    one or two times/year   Edema    LLE   Elevated liver function tests    Fibromyalgia    Headache(784.0)    otc med prn   Hx of cardiovascular stress test 11/22/2012   Lex MV 2/14: EF 81%, no ischemia   Hyperlipidemia    diet controlled - no med   Hypothyroidism    Pneumonia    hx   PONV (postoperative nausea and vomiting)    Rheumatoid arthritis (HCC) 2023   Scoliosis    history   Sleep apnea    Does not use CPAP every night   Vasovagal syncope    one episode only   Past Surgical History:  Procedure Laterality Date   ABDOMINAL HYSTERECTOMY     APPENDECTOMY     CYSTOSCOPY  05/22/2014   Procedure: CYSTOSCOPY;  Surgeon: Jacqualin Combes de Gwenevere Ghazi, MD;  Location: [facility];  Service: Gynecology;;   MOUTH SURGERY     wisdom teeth ext   NASAL SINUS SURGERY     x 3   ROBOTIC ASSISTED LAPAROSCOPIC LYSIS OF ADHESION Bilateral 05/22/2014   Procedure: XI ROBOTIC ASSISTED LAPAROSCOPIC LYSIS OF extensive ADHESION;  Surgeon: Jacqualin Combes de Gwenevere Ghazi, MD;  Location: [facility];  Service: Gynecology;  Laterality: Bilateral;   ROBOTIC ASSISTED TOTAL HYSTERECTOMY WITH BILATERAL SALPINGO OOPHERECTOMY N/A 05/22/2014   Procedure: ROBOTIC ASSISTED TOTAL HYSTERECTOMY WITH BILATERAL SALPINGO OOPHORECTOMY and cystoscopy;  Surgeon: Jacqualin Combes de Gwenevere Ghazi, MD;   Location: [facility];  Service: Gynecology;  Laterality: N/A;   Patient Active Problem List   Diagnosis Date Noted   Lateral epicondylitis, right elbow 03/24/2022   Pain in right hand 06/24/2021   Seasonal and perennial allergic rhinitis 07/07/2014   Status post laparoscopic hysterectomy 05/22/2014   Vasovagal attack 03/07/2014   Abnormal CT of the chest 10/30/2012   Chest pain 10/07/2012   Hypothyroidism    Hypertension    Allergic asthma, mild intermittent, uncomplicated    Fatigue    Edema     ONSET DATE: diagnosis Sept 2023  REFERRING DIAG: M25.50 (ICD-10-CM) - Pain in unspecified joint M06.09 (ICD-10-CM) - Rheumatoid arthritis without rheumatoid factor, multiple sites M06.4 (ICD-10-CM) - Inflammatory polyarthropathy  THERAPY DIAG:  Pain in right elbow  Pain in right hand  Muscle weakness (generalized)  Stiffness of right hand, not elsewhere classified  Rationale for Evaluation and Treatment: Rehabilitation  PERTINENT HISTORY: RA, seasonal allergies, obstructive sleep apnea, hypothyroidism, HTN; Tenderness to palpation noted to right olecranon with supination and pronation.   PRECAUTIONS: Fall; WEIGHT BEARING RESTRICTIONS: No   SUBJECTIVE:   SUBJECTIVE STATEMENT: Pt reports still assisting family with cleaning out aunt's  home which is causing pain/strain to L arm.     PAIN:  Are you having pain?    No  FALLS: Has patient fallen in last 6 months? Yes. Number of falls 1, fell over the broom on the bus  LIVING ENVIRONMENT: Lives with:  is caregiver for her elderly father Lives in: House/apartment Stairs: Yes: Internal: full flight of steps to the basement, but rarely needs to go down these steps; on left going up Has following equipment at home: shower chair and Grab bars  PLOF: Independent, Independent with basic ADLs, and Vocation/Vocational requirements: bus driver  PATIENT GOALS: to get better use of my hands in daily life     OBJECTIVE:   HAND  DOMINANCE: Right  ADLs: Eating: difficulty with opening jars/containers Grooming: painful with brushing hair UB Dressing: difficulty with buttons, fastening bra - depends on pain, swelling, and stiffness in hands/joints Equipment: Shower seat with back, Grab bars, Walk in shower, and Reacher  FUNCTIONAL OUTCOME MEASURES: Quick Dash: 29.5%; 38.3% with work portion  UPPER EXTREMITY ROM:     Active ROM Right eval Left eval  Shoulder flexion 128 (grimacing) 130  Shoulder abduction    Shoulder adduction    Shoulder extension    Shoulder internal rotation    Shoulder external rotation    Elbow flexion 139 (grimacing) 144  Elbow extension 0 0  Wrist flexion 84 83  Wrist extension 58 60  Wrist ulnar deviation    Wrist radial deviation    Wrist pronation    Wrist supination    (Blank rows = not tested)   HAND FUNCTION: TBD  COORDINATION: Box and Blocks:  Right 45 blocks, Left 51 blocks (Pt with massage at R elbow after 35 seconds and grimacing in last 10 seconds)  SENSATION: WFL  EDEMA: mild edema in R elbow and hand (at times), not present on eval   TODAY'S TREATMENT:                                                                    03/09/23 Modalities: applied moist heat to R arm for ~5 mins while discussing previous session.  Pt reports attempting a few exercises but "just not having time" due to continuing to aid in cleaning out her aunt's home.  OT reviewed joint protection principles and decreasing repetitive strain on RUE.   UE ROM/stretch: Reviewed HEP with pt completing with reports of pain around lateral epicondyle with pronation stretch.  Reviewed slow steady stretch while maintaining in nonpainful stretch.  OT instructed in MCP flexion stretch with wrist in neutral position.   Manual therapy: engaged in massage around the tricep and lateral epicondyle s/p prayer stretch and pronation stretch. Applied kinesiotape to support supination/pronation and to support tricep  extension, therefore taking pressure off the lateral epicondyle.  Exercises - Tricep Stretch- DO SEATED BY TABLE  - 3-4 x daily - 3-5 reps - 15 hold - Forearm Pronation Stretch  - 3-4 x daily - 3-5 reps - 15 sec hold - Wrist Flexion Stretch  - 4 x daily - 3-5 reps - 15 sec hold - Wrist Prayer Stretch  - 4 x daily - 3-5 reps - 15 sec hold - Hand PROM MCP Flexion  - 4 x daily -  3-5 reps - 15 sec hold   03/03/23: She has a positive resisted wrist extension test, tenderness about the lateral epicondyle, tenderness through the extensors and seems to obviously present as a lateral epicondylitis on the left arm.  She has some overuse syndrome to the right arm due to this and states disuse to the left arm chronically and has a mild medial epicondylitis in the right arm.  In both hands, she has some swelling and tenderness at the MCP joints which presents more like her RA or osteoarthritis.  OT educates her on these things and the importance of managing different problems in different ways.  She is given a new and updated HEP specifically for left arm lateral epicondylitis today, OT also applies moist heat to her arm for 3 minutes while explaining it.  Additionally OT does IASTM manual therapy around the tricep and lateral epicondyle there is a dorsal extensors which she states feels like a healthy stretch.  Next OT applies Kinesiotape to support supination to support wrist extension and to support tricep extension thereby taking pressure off the lateral epicondyle.  She states this feels helpful and reduces her pain.  There is not much time at the end of the session to have her perform her stretches but she was cautioned to do them nonpainfully and very lightly for long stretches at least 3 times a day.  She was advised to avoid lifting with her pronated position elbow straight and wrist cocked back (provocative for lateral epicondylitis) and advised to not lift anything heavy but when she has to lift do so in a  palm up position.   Exercises - Tricep Stretch- DO SEATED BY TABLE  - 3-4 x daily - 3-5 reps - 15 hold - Forearm Pronation Stretch  - 3-4 x daily - 3-5 reps - 15 sec hold - Wrist Flexion Stretch  - 4 x daily - 3-5 reps - 15 sec hold - Wrist Prayer Stretch  - 4 x daily - 3-5 reps - 15 sec hold   02/25/23 Reviewed joint protection principles in regards to repetitive movements and use of nondominant LUE with routine/daily tasks Gentle soft tissue mobilization w/ lotion to posterior forearm and origin of common extensor muscle mass in order to facilitate healing within tissue structures and decrease tension and pain. Pt positioned w/ forearm resting on pillow on tabletop surface and tolerated intervention w/out pain, though tolerable level of discomfort reported to palpation around common extensor tendon origin. Corresponding education provided on self-massage in clockwise, counter-clockwise, perpendicular, and parallel directions w/ pt returning demonstration. ROM: OT provided demonstration and instruction on simple gentle AROM. Recommended completion 3-4x/day and application of ice afterwards.  Pt demonstrating good technique with each, reporting mild discomfort in elbow with each movement but tolerable. Exercises - Seated Elbow Flexion and Extension AROM  - 3-4 x daily - 5 reps - Seated Forearm Pronation and Supination AROM  - 3-4 x daily - 5 reps - Wrist AROM Flexion Extension  - 3-4 x daily - 5 reps - Wrist AROM Radial Ulnar Deviation  - 3-4 x daily - 5 reps   PATIENT EDUCATION: Education details: ongoing condition specific education Person educated: Patient Education method: Explanation and Handouts Education comprehension: verbalized understanding  HOME EXERCISE PROGRAM: Access Code: [email or web address] URL: [email or web address] Date: 02/25/2023 Prepared by: [facility] - Outpatient  Rehab - [facility]   GOALS: Goals reviewed with patient? No  SHORT TERM GOALS: Target  date: 03/22/23  Pt will be independent in  gentle ROM exercises for pain management. Baseline: Goal status: IN PROGRESS  2.  Pt will verbalize understanding of use of modalities to decrease inflammation/pain.  Baseline:  Goal status: IN PROGRESS  3.  Pt will verbalize understanding of task modifications and/or potential AE needs to increase ease, safety, and independence with IADLs.  Baseline:  Goal status: IN PROGRESS    LONG TERM GOALS: Target date: 04/19/23  Pt will demonstrate improved UE functional use for ADLs as evidenced by increasing box/ blocks score by 5 blocks with RUE. Baseline: Right 45 blocks, Left 51 blocks Goal status: IN PROGRESS  2.  Pt will report decreased pain in R arm/hand during activities requiring repetitive movements to less than 5 on pain scale.  Baseline:  Goal status: IN PROGRESS  3.  Pt will tolerate repetitive motion against min resistance to engage in ADLs/IADLs for 5 mins (brushing hair, washing dishes, unloading groceries, etc) without increasing pain > 2 points on 10 point scale. Baseline:  Goal status: IN PROGRESS  4.  Pt will report improved functional use of RUE as evidenced by improved score on Quick DASH to 20% impairment or less. Baseline: 29.5%; 38.3% with work portion Goal status: IN PROGRESS   ASSESSMENT:  CLINICAL IMPRESSION: Pt continues to c/o pain in R elbow due to chronic lateral epicondylitis.  OT reviewed HEP as pt reports not having time to complete.  OT reiterated gentle stretches and encouraged rest as able and reviewed joint protection principles.  Pt with pain with pronation stretch, continued tenderness around lateral epicondyle and swelling in upper arm and forearm.        PLAN:  OT FREQUENCY: 1-2x/week  OT DURATION: 8 weeks  PLANNED INTERVENTIONS: self care/ADL training, therapeutic exercise, therapeutic activity, neuromuscular re-education, manual therapy, passive range of motion, splinting, electrical  stimulation, ultrasound, iontophoresis, paraffin, compression bandaging, moist heat, cryotherapy, contrast bath, patient/family education, psychosocial skills training, energy conservation, coping strategies training, and DME and/or AE instructions   CONSULTED AND AGREED WITH PLAN OF CARE: Patient  PLAN FOR NEXT SESSION:   Continue on trying to manage left lateral epicondylitis.  Review gentle finger stretches especially at the MCP joints and other appropriate.  Use modalities and manual therapy as helpful   Cameron Katayama, OTR/L 03/09/2023, 10:27 AM

## 2023-03-16 ENCOUNTER — Ambulatory Visit: Payer: BC Managed Care – PPO | Admitting: Occupational Therapy

## 2023-03-16 DIAGNOSIS — M79641 Pain in right hand: Secondary | ICD-10-CM

## 2023-03-16 DIAGNOSIS — M6281 Muscle weakness (generalized): Secondary | ICD-10-CM

## 2023-03-16 DIAGNOSIS — M25521 Pain in right elbow: Secondary | ICD-10-CM

## 2023-03-16 NOTE — Therapy (Signed)
OUTPATIENT OCCUPATIONAL THERAPY ORTHO  Treatment Note  Patient Name: Destiny Sharp MRN: [id number] DOB:[birth year], 52 y.o., female Today's Date: 03/16/2023  PCP: [facility] REFERRING PROVIDER: Azucena Fallen, PA-C  END OF SESSION:  OT End of Session - 03/16/23 1606     Visit Number 5    Number of Visits 13    Date for OT Re-Evaluation 04/19/23    Authorization Type BCBS    OT Start Time 1236    OT Stop Time 1318    OT Time Calculation (min) 42 min                Past Medical History:  Diagnosis Date   Allergy    Anxiety    no meds   Asthma    Bronchitis    one or two times/year   Edema    LLE   Elevated liver function tests    Fibromyalgia    Headache(784.0)    otc med prn   Hx of cardiovascular stress test 11/22/2012   Lex MV 2/14: EF 81%, no ischemia   Hyperlipidemia    diet controlled - no med   Hypothyroidism    Pneumonia    hx   PONV (postoperative nausea and vomiting)    Rheumatoid arthritis (HCC) 2023   Scoliosis    history   Sleep apnea    Does not use CPAP every night   Vasovagal syncope    one episode only   Past Surgical History:  Procedure Laterality Date   ABDOMINAL HYSTERECTOMY     APPENDECTOMY     CYSTOSCOPY  05/22/2014   Procedure: CYSTOSCOPY;  Surgeon: Jacqualin Combes de Gwenevere Ghazi, MD;  Location: [facility];  Service: Gynecology;;   MOUTH SURGERY     wisdom teeth ext   NASAL SINUS SURGERY     x 3   ROBOTIC ASSISTED LAPAROSCOPIC LYSIS OF ADHESION Bilateral 05/22/2014   Procedure: XI ROBOTIC ASSISTED LAPAROSCOPIC LYSIS OF extensive ADHESION;  Surgeon: Jacqualin Combes de Gwenevere Ghazi, MD;  Location: [facility];  Service: Gynecology;  Laterality: Bilateral;   ROBOTIC ASSISTED TOTAL HYSTERECTOMY WITH BILATERAL SALPINGO OOPHERECTOMY N/A 05/22/2014   Procedure: ROBOTIC ASSISTED TOTAL HYSTERECTOMY WITH BILATERAL SALPINGO OOPHORECTOMY and cystoscopy;  Surgeon: Jacqualin Combes de Gwenevere Ghazi, MD;   Location: [facility];  Service: Gynecology;  Laterality: N/A;   Patient Active Problem List   Diagnosis Date Noted   Lateral epicondylitis, right elbow 03/24/2022   Pain in right hand 06/24/2021   Seasonal and perennial allergic rhinitis 07/07/2014   Status post laparoscopic hysterectomy 05/22/2014   Vasovagal attack 03/07/2014   Abnormal CT of the chest 10/30/2012   Chest pain 10/07/2012   Hypothyroidism    Hypertension    Allergic asthma, mild intermittent, uncomplicated    Fatigue    Edema     ONSET DATE: diagnosis Sept 2023  REFERRING DIAG: M25.50 (ICD-10-CM) - Pain in unspecified joint M06.09 (ICD-10-CM) - Rheumatoid arthritis 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   PRECAUTIONS: Fall; WEIGHT BEARING RESTRICTIONS: No   SUBJECTIVE:   SUBJECTIVE STATEMENT: Pt reports she was packing clothing and putting items in suitcase on Weds when she reports  something in her R elbow "moved", shooting pain up to shoulder and down to hand.  Reports pain consistent until Friday/Saturday.   PAIN:  Are you having pain?    No  FALLS: Has patient fallen in last 6 months? Yes. Number of falls 1, fell over the broom on the bus  LIVING ENVIRONMENT: Lives with:  is caregiver for her elderly father Lives in: House/apartment Stairs: Yes: Internal: full flight of steps to the basement, but rarely needs to go down these steps; on left going up Has following equipment at home: shower chair and Grab bars  PLOF: Independent, Independent with basic ADLs, and Vocation/Vocational requirements: bus driver  PATIENT GOALS: to get better use  of my hands in daily life     OBJECTIVE:   HAND DOMINANCE: Right  ADLs: Eating: difficulty with opening jars/containers Grooming: painful with brushing hair UB Dressing: difficulty with buttons, fastening bra - depends on pain, swelling, and stiffness in hands/joints Equipment: Shower seat with back, Grab bars, Walk in shower, and Reacher  FUNCTIONAL OUTCOME MEASURES: Quick Dash: 29.5%; 38.3% with work portion  UPPER EXTREMITY ROM:     Active ROM Right eval Left eval  Shoulder flexion 128 (grimacing) 130  Shoulder abduction    Shoulder adduction    Shoulder extension    Shoulder internal rotation    Shoulder external rotation    Elbow flexion 139 (grimacing) 144  Elbow extension 0 0  Wrist flexion 84 83  Wrist extension 58 60  Wrist ulnar deviation    Wrist radial deviation    Wrist pronation    Wrist supination    (Blank rows = not tested)   HAND FUNCTION: TBD  COORDINATION: Box and Blocks:  Right 45 blocks, Left 51 blocks (Pt with massage at R elbow after 35 seconds and grimacing in last 10 seconds)  SENSATION: WFL  EDEMA: mild edema in R elbow and hand (at times), not present on eval   TODAY'S TREATMENT:                                                                    03/16/23 Self-care: engaged in further assessment of R lateral elbow as pt with increased swelling and tenderness to touch.  OT educated on possible rationale for increased inflammation and reiterated recommendations from previous sessions in regards to movement patterns to alleviate risk of increased injury and encouraged wear of immobilizer brace.   UE ROM: Reviewed HEP with focus on wrist flexion/extension and pronation stretch.  Pt with increased tenderness with pronation stretch, therefore terminated and completed wrist flexion/extension.  OT reviewed slow steady stretch while maintaining in nonpainful stretch.   Modalities: Educated on and engaged in [employer], providing pt with  education during technique with focus on decreasing pain and inflammation.  Pt tolerated well. Applied kinesiotape to support supination/pronation and to support tricep extension, therefore taking pressure off the lateral epicondyle.   03/09/23 Modalities: applied moist heat to R arm for ~5 mins while discussing previous session.  Pt reports attempting a few exercises but "just not having time" due to continuing to aid in cleaning out her aunt's home.  OT reviewed joint protection principles and decreasing repetitive strain on RUE.   UE ROM/stretch: Reviewed HEP with pt  completing with reports of pain around lateral epicondyle with pronation stretch.  Reviewed slow steady stretch while maintaining in nonpainful stretch.  OT instructed in MCP flexion stretch with wrist in neutral position.   Manual therapy: engaged in massage around the tricep and lateral epicondyle s/p prayer stretch and pronation stretch. Applied kinesiotape to support supination/pronation and to support tricep extension, therefore taking pressure off the lateral epicondyle.  Exercises - Tricep Stretch- DO SEATED BY TABLE  - 3-4 x daily - 3-5 reps - 15 hold - Forearm Pronation Stretch  - 3-4 x daily - 3-5 reps - 15 sec hold - Wrist Flexion Stretch  - 4 x daily - 3-5 reps - 15 sec hold - Wrist Prayer Stretch  - 4 x daily - 3-5 reps - 15 sec hold - Hand PROM MCP Flexion  - 4 x daily - 3-5 reps - 15 sec hold   03/03/23: She has a positive resisted wrist extension test, tenderness about the lateral epicondyle, tenderness through the extensors and seems to obviously present as a lateral epicondylitis on the left arm.  She has some overuse syndrome to the right arm due to this and states disuse to the left arm chronically and has a mild medial epicondylitis in the right arm.  In both hands, she has some swelling and tenderness at the MCP joints which presents more like her RA or osteoarthritis.  OT educates her on these things and the  importance of managing different problems in different ways.  She is given a new and updated HEP specifically for left arm lateral epicondylitis today, OT also applies moist heat to her arm for 3 minutes while explaining it.  Additionally OT does IASTM manual therapy around the tricep and lateral epicondyle there is a dorsal extensors which she states feels like a healthy stretch.  Next OT applies Kinesiotape to support supination to support wrist extension and to support tricep extension thereby taking pressure off the lateral epicondyle.  She states this feels helpful and reduces her pain.  There is not much time at the end of the session to have her perform her stretches but she was cautioned to do them nonpainfully and very lightly for long stretches at least 3 times a day.  She was advised to avoid lifting with her pronated position elbow straight and wrist cocked back (provocative for lateral epicondylitis) and advised to not lift anything heavy but when she has to lift do so in a palm up position.   Exercises - Tricep Stretch- DO SEATED BY TABLE  - 3-4 x daily - 3-5 reps - 15 hold - Forearm Pronation Stretch  - 3-4 x daily - 3-5 reps - 15 sec hold - Wrist Flexion Stretch  - 4 x daily - 3-5 reps - 15 sec hold - Wrist Prayer Stretch  - 4 x daily - 3-5 reps - 15 sec hold     PATIENT EDUCATION: Education details: ongoing condition specific education Person educated: Patient Education method: Explanation and Handouts Education comprehension: verbalized understanding  HOME EXERCISE PROGRAM: Access Code: [email or web address] URL: [email or web address] Date: 02/25/2023 Prepared by: [facility] - Outpatient  Rehab - [facility]   GOALS: Goals reviewed with patient? No  SHORT TERM GOALS: Target date: 03/22/23  Pt will be independent in gentle ROM exercises for pain management. Baseline: Goal status: IN PROGRESS  2.  Pt will verbalize understanding of use of modalities to decrease  inflammation/pain.  Baseline:  Goal status: IN PROGRESS  3.  Pt  will verbalize understanding of task modifications and/or potential AE needs to increase ease, safety, and independence with IADLs.  Baseline:  Goal status: IN PROGRESS    LONG TERM GOALS: Target date: 04/19/23  Pt will demonstrate improved UE functional use for ADLs as evidenced by increasing box/ blocks score by 5 blocks with RUE. Baseline: Right 45 blocks, Left 51 blocks Goal status: IN PROGRESS  2.  Pt will report decreased pain in R arm/hand during activities requiring repetitive movements to less than 5 on pain scale.  Baseline:  Goal status: IN PROGRESS  3.  Pt will tolerate repetitive motion against min resistance to engage in ADLs/IADLs for 5 mins (brushing hair, washing dishes, unloading groceries, etc) without increasing pain > 2 points on 10 point scale. Baseline:  Goal status: IN PROGRESS  4.  Pt will report improved functional use of RUE as evidenced by improved score on Quick DASH to 20% impairment or less. Baseline: 29.5%; 38.3% with work portion Goal status: IN PROGRESS   ASSESSMENT:  CLINICAL IMPRESSION: Pt continues to c/o pain in R elbow due to chronic lateral epicondylitis, reporting increased pain, tenderness, and swelling.  OT reviewed HEP in non-painful ROM.  OT reiterated gentle stretches and encouraged rest as able, encouraging pt to locate and wear immobilization brace.  Pt tolerated ice massage to address pain and swelling, reports understanding of concerns of overuse and possible further injury.     PLAN:  OT FREQUENCY: 1-2x/week  OT DURATION: 8 weeks  PLANNED INTERVENTIONS: self care/ADL training, therapeutic exercise, therapeutic activity, neuromuscular re-education, manual therapy, passive range of motion, splinting, electrical stimulation, ultrasound, iontophoresis, paraffin, compression bandaging, moist heat, cryotherapy, contrast bath, patient/family education, psychosocial  skills training, energy conservation, coping strategies training, and DME and/or AE instructions   CONSULTED AND AGREED WITH PLAN OF CARE: Patient  PLAN FOR NEXT SESSION:   Continue on trying to manage left lateral epicondylitis.  Review gentle finger stretches especially at the MCP joints and other appropriate.  Use modalities and manual therapy as helpful   Chonita Gadea, OTR/L 03/16/2023, 4:07 PM

## 2023-03-24 ENCOUNTER — Ambulatory Visit: Payer: BC Managed Care – PPO | Admitting: Rehabilitative and Restorative Service Providers"

## 2023-03-30 ENCOUNTER — Ambulatory Visit: Payer: BC Managed Care – PPO | Admitting: Occupational Therapy

## 2023-04-07 ENCOUNTER — Ambulatory Visit: Payer: BC Managed Care – PPO | Admitting: Occupational Therapy

## 2023-04-07 DIAGNOSIS — M25521 Pain in right elbow: Secondary | ICD-10-CM

## 2023-04-07 DIAGNOSIS — M6281 Muscle weakness (generalized): Secondary | ICD-10-CM

## 2023-04-07 DIAGNOSIS — M79641 Pain in right hand: Secondary | ICD-10-CM

## 2023-04-07 NOTE — Therapy (Signed)
OUTPATIENT OCCUPATIONAL THERAPY ORTHO  Treatment Note  Patient Name: Destiny Sharp MRN: [id number] DOB:08-Nov-1970, 52 y.o., female Today's Date: 04/07/2023  PCP: [facility] REFERRING PROVIDER: Azucena Fallen, PA-C  END OF SESSION:       Past Medical History:  Diagnosis Date   Allergy    Anxiety    no meds   Asthma    Bronchitis    one or two times/year   Edema    LLE   Elevated liver function tests    Fibromyalgia    Headache(784.0)    otc med prn   Hx of cardiovascular stress test 11/22/2012   Lex MV 2/14: EF 81%, no ischemia   Hyperlipidemia    diet controlled - no med   Hypothyroidism    Pneumonia    hx   PONV (postoperative nausea and vomiting)    Rheumatoid arthritis (HCC) 2023   Scoliosis    history   Sleep apnea    Does not use CPAP every night   Vasovagal syncope    one episode only   Past Surgical History:  Procedure Laterality Date   ABDOMINAL HYSTERECTOMY     APPENDECTOMY     CYSTOSCOPY  05/22/2014   Procedure: CYSTOSCOPY;  Surgeon: Jacqualin Combes de Gwenevere Ghazi, MD;  Location: [facility];  Service: Gynecology;;   MOUTH SURGERY     wisdom teeth ext   NASAL SINUS SURGERY     x 3   ROBOTIC ASSISTED LAPAROSCOPIC LYSIS OF ADHESION Bilateral 05/22/2014   Procedure: XI ROBOTIC ASSISTED LAPAROSCOPIC LYSIS OF extensive ADHESION;  Surgeon: Jacqualin Combes de Gwenevere Ghazi, MD;  Location: [facility];  Service: Gynecology;  Laterality: Bilateral;   ROBOTIC ASSISTED TOTAL HYSTERECTOMY WITH BILATERAL SALPINGO OOPHERECTOMY N/A 05/22/2014   Procedure: ROBOTIC ASSISTED TOTAL HYSTERECTOMY WITH BILATERAL SALPINGO OOPHORECTOMY and cystoscopy;  Surgeon: Jacqualin Combes de Gwenevere Ghazi, MD;  Location: [facility];  Service: Gynecology;  Laterality: N/A;   Patient Active Problem List   Diagnosis Date Noted   Lateral epicondylitis, right elbow 03/24/2022   Pain in right hand 06/24/2021   Seasonal and perennial allergic rhinitis  07/07/2014   Status post laparoscopic hysterectomy 05/22/2014   Vasovagal attack 03/07/2014   Abnormal CT of the chest 10/30/2012   Chest pain 10/07/2012   Hypothyroidism    Hypertension    Allergic asthma, mild intermittent, uncomplicated    Fatigue    Edema     ONSET DATE: diagnosis Sept 2023  REFERRING DIAG: M25.50 (ICD-10-CM) - Pain in unspecified joint M06.09 (ICD-10-CM) - Rheumatoid arthritis without rheumatoid factor, multiple sites M06.4 (ICD-10-CM) - Inflammatory polyarthropathy  THERAPY DIAG:  No diagnosis found.  Rationale for Evaluation and Treatment: Rehabilitation  PERTINENT HISTORY: RA, seasonal allergies, obstructive sleep apnea, hypothyroidism, HTN; Tenderness to palpation noted to right olecranon with supination and pronation.   PRECAUTIONS: Fall; WEIGHT BEARING RESTRICTIONS: No   SUBJECTIVE:   SUBJECTIVE STATEMENT: Pt reports having an MRI about 10 days ago where they noted a small tear but that he reports it does not warrant surgery at this time.     PAIN:  Are you having pain?    No  FALLS: Has patient fallen in last 6 months? Yes. Number of falls 1, fell over the broom on the bus  LIVING ENVIRONMENT: Lives with:  is caregiver for her elderly father Lives in: House/apartment Stairs: Yes: Internal: full flight of steps to the basement, but rarely needs to go down these  steps; on left going up Has following equipment at home: shower chair and Grab bars  PLOF: Independent, Independent with basic ADLs, and Vocation/Vocational requirements: bus driver  PATIENT GOALS: to get better use of my hands in daily life     OBJECTIVE:   HAND DOMINANCE: Right  ADLs: Eating: difficulty with opening jars/containers Grooming: painful with brushing hair UB Dressing: difficulty with buttons, fastening bra - depends on pain, swelling, and stiffness in hands/joints Equipment: Shower seat with back, Grab bars, Walk in shower, and Reacher  FUNCTIONAL OUTCOME  MEASURES: Quick Dash: 29.5%; 38.3% with work portion  UPPER EXTREMITY ROM:     Active ROM Right eval Left eval  Shoulder flexion 128 (grimacing) 130  Shoulder abduction    Shoulder adduction    Shoulder extension    Shoulder internal rotation    Shoulder external rotation    Elbow flexion 139 (grimacing) 144  Elbow extension 0 0  Wrist flexion 84 83  Wrist extension 58 60  Wrist ulnar deviation    Wrist radial deviation    Wrist pronation    Wrist supination    (Blank rows = not tested)   HAND FUNCTION: TBD  COORDINATION: Box and Blocks:  Right 45 blocks, Left 51 blocks (Pt with massage at R elbow after 35 seconds and grimacing in last 10 seconds)  SENSATION: WFL  EDEMA: mild edema in R elbow and hand (at times), not present on eval   TODAY'S TREATMENT:                                                                    04/07/23 Discussed anatomy of lateral epicondylitis, showing pictures to facilitate increased understanding and awareness.   Discussed return to work and possibility of pursuing other jobs that she can perform that require less repetitive movements and/or stress on lateral epicondyle.   Ultrasound treatment applied to L anterior forearm for 8 min to address pain relief and promote blood flow for tissue healing; pt verbally denied contraindications or precautions. No adverse reaction observed or reported after application; skin intact Parameters: 3.3 MHz, 20% duty cycle, and intensity of 1.0 W/cm2     03/16/23 Self-care: engaged in further assessment of R lateral elbow as pt with increased swelling and tenderness to touch.  OT educated on possible rationale for increased inflammation and reiterated recommendations from previous sessions in regards to movement patterns to alleviate risk of increased injury and encouraged wear of immobilizer brace.   UE ROM: Reviewed HEP with focus on wrist flexion/extension and pronation stretch.  Pt with increased tenderness  with pronation stretch, therefore terminated and completed wrist flexion/extension.  OT reviewed slow steady stretch while maintaining in nonpainful stretch.   Modalities: Educated on and engaged in [employer], providing pt with education during technique with focus on decreasing pain and inflammation.  Pt tolerated well. Applied kinesiotape to support supination/pronation and to support tricep extension, therefore taking pressure off the lateral epicondyle.   03/09/23 Modalities: applied moist heat to R arm for ~5 mins while discussing previous session.  Pt reports attempting a few exercises but "just not having time" due to continuing to aid in cleaning out her aunt's home.  OT reviewed joint protection principles and decreasing repetitive strain on RUE.  UE ROM/stretch: Reviewed HEP with pt completing with reports of pain around lateral epicondyle with pronation stretch.  Reviewed slow steady stretch while maintaining in nonpainful stretch.  OT instructed in MCP flexion stretch with wrist in neutral position.   Manual therapy: engaged in massage around the tricep and lateral epicondyle s/p prayer stretch and pronation stretch. Applied kinesiotape to support supination/pronation and to support tricep extension, therefore taking pressure off the lateral epicondyle.  Exercises - Tricep Stretch- DO SEATED BY TABLE  - 3-4 x daily - 3-5 reps - 15 hold - Forearm Pronation Stretch  - 3-4 x daily - 3-5 reps - 15 sec hold - Wrist Flexion Stretch  - 4 x daily - 3-5 reps - 15 sec hold - Wrist Prayer Stretch  - 4 x daily - 3-5 reps - 15 sec hold - Hand PROM MCP Flexion  - 4 x daily - 3-5 reps - 15 sec hold       PATIENT EDUCATION: Education details: ongoing condition specific education Person educated: Patient Education method: Explanation and Handouts Education comprehension: verbalized understanding  HOME EXERCISE PROGRAM: Access Code: [email or web address] URL: [email or web address] Date:  02/25/2023 Prepared by: [facility] - Outpatient  Rehab - [facility]   GOALS: Goals reviewed with patient? No  SHORT TERM GOALS: Target date: 03/22/23  Pt will be independent in gentle ROM exercises for pain management. Baseline: Goal status: IN PROGRESS  2.  Pt will verbalize understanding of use of modalities to decrease inflammation/pain.  Baseline:  Goal status: IN PROGRESS  3.  Pt will verbalize understanding of task modifications and/or potential AE needs to increase ease, safety, and independence with IADLs.  Baseline:  Goal status: IN PROGRESS    LONG TERM GOALS: Target date: 04/19/23  Pt will demonstrate improved UE functional use for ADLs as evidenced by increasing box/ blocks score by 5 blocks with RUE. Baseline: Right 45 blocks, Left 51 blocks Goal status: IN PROGRESS  2.  Pt will report decreased pain in R arm/hand during activities requiring repetitive movements to less than 5 on pain scale.  Baseline:  Goal status: IN PROGRESS  3.  Pt will tolerate repetitive motion against min resistance to engage in ADLs/IADLs for 5 mins (brushing hair, washing dishes, unloading groceries, etc) without increasing pain > 2 points on 10 point scale. Baseline:  Goal status: IN PROGRESS  4.  Pt will report improved functional use of RUE as evidenced by improved score on Quick DASH to 20% impairment or less. Baseline: 29.5%; 38.3% with work portion Goal status: IN PROGRESS   ASSESSMENT:  CLINICAL IMPRESSION: Pt reiterated rest and wear of counter force brace to provide support.  Pt presents with continued pain, however does demonstrate wear of brace with arrival wearing it this session and reports improvements in rest.     PLAN:  OT FREQUENCY: 1-2x/week  OT DURATION: 8 weeks  PLANNED INTERVENTIONS: self care/ADL training, therapeutic exercise, therapeutic activity, neuromuscular re-education, manual therapy, passive range of motion, splinting, electrical  stimulation, ultrasound, iontophoresis, paraffin, compression bandaging, moist heat, cryotherapy, contrast bath, patient/family education, psychosocial skills training, energy conservation, coping strategies training, and DME and/or AE instructions   CONSULTED AND AGREED WITH PLAN OF CARE: Patient  PLAN FOR NEXT SESSION:   Continue on trying to manage left lateral epicondylitis.  Review gentle finger stretches especially at the MCP joints and other appropriate.  Use modalities and manual therapy as helpful.  Discuss return to work options and discuss upcoming d/c vs re-cert.  Rosalio Loud, OTR/L 04/07/2023, 9:27 AM

## 2023-04-13 ENCOUNTER — Ambulatory Visit: Payer: BC Managed Care – PPO | Attending: Physician Assistant | Admitting: Occupational Therapy

## 2023-04-13 DIAGNOSIS — M79641 Pain in right hand: Secondary | ICD-10-CM | POA: Insufficient documentation

## 2023-04-13 DIAGNOSIS — M25521 Pain in right elbow: Secondary | ICD-10-CM | POA: Diagnosis present

## 2023-04-13 DIAGNOSIS — M6281 Muscle weakness (generalized): Secondary | ICD-10-CM | POA: Insufficient documentation

## 2023-04-13 NOTE — Patient Instructions (Signed)
Focusing on pain management and promoting the healing of soft tissue structures:  Wear of counterforce elbow brace in conjunction with wrist immobilization brace.  Moist heat (i.e. moist heat heating pad) may be initiated to the elbow for  15 minute sessions, two times  a day. The moist heat promotes soft tissue healing and increases the flexibility of the muscles  surrounding the elbow.  Manual massage is recommended for 5 minute sessions 2 times a day. Massage should be  performed in a clockwise fashion, counter clockwise, along the length of the muscle and perpendicular  to the muscle.

## 2023-04-13 NOTE — Therapy (Addendum)
OUTPATIENT OCCUPATIONAL THERAPY ORTHO  Treatment Note  Patient Name: Destiny Sharp MRN: [id number] DOB:1971/01/06, 52 y.o., female Today's Date: 04/13/2023  PCP: [facility] REFERRING PROVIDER: Azucena Fallen, PA-C  END OF SESSION:  OT End of Session - 04/13/23 1054     Visit Number 7    Number of Visits 13    Date for OT Re-Evaluation 04/19/23    Authorization Type [employer]    OT Start Time 1025   arrival time   OT Stop Time 1055    OT Time Calculation (min) 30 min                 Past Medical History:  Diagnosis Date   Allergy    Anxiety    no meds   Asthma    Bronchitis    one or two times/year   Edema    LLE   Elevated liver function tests    Fibromyalgia    Headache(784.0)    otc med prn   Hx of cardiovascular stress test 11/22/2012   Lex MV 2/14: EF 81%, no ischemia   Hyperlipidemia    diet controlled - no med   Hypothyroidism    Pneumonia    hx   PONV (postoperative nausea and vomiting)    Rheumatoid arthritis (HCC) 2023   Scoliosis    history   Sleep apnea    Does not use CPAP every night   Vasovagal syncope    one episode only   Past Surgical History:  Procedure Laterality Date   ABDOMINAL HYSTERECTOMY     APPENDECTOMY     CYSTOSCOPY  05/22/2014   Procedure: CYSTOSCOPY;  Surgeon: Jacqualin Combes de Gwenevere Ghazi, MD;  Location: [facility];  Service: Gynecology;;   MOUTH SURGERY     wisdom teeth ext   NASAL SINUS SURGERY     x 3   ROBOTIC ASSISTED LAPAROSCOPIC LYSIS OF ADHESION Bilateral 05/22/2014   Procedure: XI ROBOTIC ASSISTED LAPAROSCOPIC LYSIS OF extensive ADHESION;  Surgeon: Jacqualin Combes de Gwenevere Ghazi, MD;  Location: [facility];  Service: Gynecology;  Laterality: Bilateral;   ROBOTIC ASSISTED TOTAL HYSTERECTOMY WITH BILATERAL SALPINGO OOPHERECTOMY N/A 05/22/2014   Procedure: ROBOTIC ASSISTED TOTAL HYSTERECTOMY WITH BILATERAL SALPINGO OOPHORECTOMY and cystoscopy;  Surgeon: Jacqualin Combes de Gwenevere Ghazi, MD;  Location: [facility];  Service: Gynecology;  Laterality: N/A;   Patient Active Problem List   Diagnosis Date Noted   Lateral epicondylitis, right elbow 03/24/2022   Pain in right hand 06/24/2021   Seasonal and perennial allergic rhinitis 07/07/2014   Status post laparoscopic hysterectomy 05/22/2014   Vasovagal attack 03/07/2014   Abnormal CT of the chest 10/30/2012   Chest pain 10/07/2012   Hypothyroidism    Hypertension    Allergic asthma, mild intermittent, uncomplicated    Fatigue    Edema     ONSET DATE: diagnosis Sept 2023  REFERRING DIAG: M25.50 (ICD-10-CM) - Pain in unspecified joint M06.09 (ICD-10-CM) - Rheumatoid arthritis 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   PRECAUTIONS: Fall; WEIGHT BEARING RESTRICTIONS: No   SUBJECTIVE:   SUBJECTIVE STATEMENT: Pt reports being in a lot of pain today in R arm.  She states that she had had her hair done for a bridal shower and ultimately had to use her R arm to undo and brush her hair after unable to complete with L hand.     PAIN:  Are you having pain?    Yes, 5-6/10 in R elbow.  Reports has been up as high as 9.  Utilizing ice and voltaren for pain.  FALLS: Has patient fallen in last 6 months? Yes. Number of falls 1, fell over the broom on the bus  LIVING ENVIRONMENT: Lives with:  is caregiver for her elderly father Lives in: House/apartment Stairs: Yes: Internal: full flight of steps to the basement, but rarely needs to go down these steps; on left going up Has following equipment at home: shower chair and Grab bars  PLOF: Independent, Independent  with basic ADLs, and Vocation/Vocational requirements: bus driver  PATIENT GOALS: to get better use of my hands in daily life     OBJECTIVE:   HAND DOMINANCE: Right  ADLs: Eating: difficulty with opening jars/containers Grooming: painful with brushing hair UB Dressing: difficulty with buttons, fastening bra - depends on pain, swelling, and stiffness in hands/joints Equipment: Shower seat with back, Grab bars, Walk in shower, and Reacher  FUNCTIONAL OUTCOME MEASURES: Quick Dash: 29.5%; 38.3% with work portion  UPPER EXTREMITY ROM:     Active ROM Right eval Left eval  Shoulder flexion 128 (grimacing) 130  Shoulder abduction    Shoulder adduction    Shoulder extension    Shoulder internal rotation    Shoulder external rotation    Elbow flexion 139 (grimacing) 144  Elbow extension 0 0  Wrist flexion 84 83  Wrist extension 58 60  Wrist ulnar deviation    Wrist radial deviation    Wrist pronation    Wrist supination    (Blank rows = not tested)   HAND FUNCTION: TBD  COORDINATION: Box and Blocks:  Right 45 blocks, Left 51 blocks (Pt with massage at R elbow after 35 seconds and grimacing in last 10 seconds)  SENSATION: WFL  EDEMA: mild edema in R elbow and hand (at times), not present on eval   TODAY'S TREATMENT:                                                                    04/13/23 Massage: OT reviewed massage technique while performing massage in clockwise, counter clockwise, linear, and perpendicular pattern.  OT reiterating completion 2x/day for 5 mins each time.  Reviewed application of moist heat prior to massage and then use of ice either after massage or PRN based on pain. Ultrasound treatment applied to L anterior forearm for 8 min to address pain relief and promote blood flow for tissue healing; pt verbally denied contraindications or precautions. No adverse reaction observed or reported after application; skin intact Parameters: 3.3 MHz, 20% duty cycle,  and intensity of 1.0 W/cm2    04/07/23 Discussed anatomy of lateral epicondylitis, showing pictures to facilitate increased understanding and awareness.   Discussed return to work and possibility of pursuing other jobs that she can perform that require less repetitive movements and/or stress on lateral epicondyle.   Ultrasound treatment applied to L anterior forearm for 8 min to address pain relief and promote blood  flow for tissue healing; pt verbally denied contraindications or precautions. No adverse reaction observed or reported after application; skin intact Parameters: 3.3 MHz, 20% duty cycle, and intensity of 1.0 W/cm2     03/16/23 Self-care: engaged in further assessment of R lateral elbow as pt with increased swelling and tenderness to touch.  OT educated on possible rationale for increased inflammation and reiterated recommendations from previous sessions in regards to movement patterns to alleviate risk of increased injury and encouraged wear of immobilizer brace.   UE ROM: Reviewed HEP with focus on wrist flexion/extension and pronation stretch.  Pt with increased tenderness with pronation stretch, therefore terminated and completed wrist flexion/extension.  OT reviewed slow steady stretch while maintaining in nonpainful stretch.   Modalities: Educated on and engaged in [employer], providing pt with education during technique with focus on decreasing pain and inflammation.  Pt tolerated well. Applied kinesiotape to support supination/pronation and to support tricep extension, therefore taking pressure off the lateral epicondyle.   PATIENT EDUCATION: Education details: ongoing condition specific education Person educated: Patient Education method: Chief Technology Officer Education comprehension: verbalized understanding  HOME EXERCISE PROGRAM: Access Code: [email or web address] URL: [email or web address] Date: 02/25/2023 Prepared by: [facility] - Outpatient  Rehab - [facility]   GOALS: Goals reviewed with patient? No  SHORT TERM GOALS: Target date: 03/22/23  Pt will be independent in gentle ROM exercises for pain management. Baseline: Goal status: IN PROGRESS  2.  Pt will verbalize understanding of use of modalities to decrease inflammation/pain.  Baseline:  Goal status: IN PROGRESS  3.  Pt will verbalize understanding of task modifications and/or potential AE needs to increase ease, safety, and independence with IADLs.  Baseline:  Goal status: IN PROGRESS    LONG TERM GOALS: Target date: 04/19/23  Pt will demonstrate improved UE functional use for ADLs as evidenced by increasing box/ blocks score by 5 blocks with RUE. Baseline: Right 45 blocks, Left 51 blocks Goal status: IN PROGRESS  2.  Pt will report decreased pain in R arm/hand during activities requiring repetitive movements to less than 5 on pain scale.  Baseline:  Goal status: IN PROGRESS  3.  Pt will tolerate repetitive motion against min resistance to engage in ADLs/IADLs for 5 mins (brushing hair, washing dishes, unloading groceries, etc) without increasing pain > 2 points on 10 point scale. Baseline:  Goal status: IN PROGRESS  4.  Pt will report improved functional use of RUE as evidenced by improved score on Quick DASH to 20% impairment or less. Baseline: 29.5%; 38.3% with work portion Goal status: IN PROGRESS   ASSESSMENT:  CLINICAL IMPRESSION: Pt reports increased pain after the weekend and reiterating unable to tolerate any movement, exercise, and avoiding repetitive movements.  OT reviewed recommendation for rest, use of modalities (heat prior to massage and ice PRN), massage, and wear of counter force brace and reiterated use of wrist immobilizer as well.  OT encouraged pt to focus on rest, wearing orthoses, massage, and use of modalities for 2 solid weeks and then if pain seems to have improved, can resume therapy attempts to reintroduce gentle ROM exercises.  If pain  persists or worsens, pt may need to f/u with orthopedic MD and/or rheumatologist.   PLAN:  OT FREQUENCY: 1-2x/week  OT DURATION: 8 weeks  PLANNED INTERVENTIONS: self care/ADL training, therapeutic exercise, therapeutic activity, neuromuscular re-education, manual therapy, passive range of motion, splinting, electrical stimulation, ultrasound, iontophoresis, paraffin, compression bandaging, moist heat, cryotherapy, contrast bath, patient/family education, psychosocial skills training,  energy conservation, coping strategies training, and DME and/or AE instructions   CONSULTED AND AGREED WITH PLAN OF CARE: Patient  PLAN FOR NEXT SESSION:   If pain decreased: Review gentle finger stretches especially at the MCP joints and other appropriate.  Use modalities and manual therapy as helpful.  Discuss return to work options and re-cert to address exercises.   Rosalio Loud, OTR/L 04/13/2023, 10:54 AM   OCCUPATIONAL THERAPY DISCHARGE SUMMARY  Visits from Start of Care: 7  Current functional level related to goals / functional outcomes: Unsure as pt has not returned since visit on 04/13/23.  Pt was demonstrating significantly increased pain.  Plan was to rest for 2 weeks to see if pain improves at all, however pt did not return after 2 week break.     Remaining deficits: Pain at last visit   Education / Equipment: rest, use of modalities (heat prior to massage and ice PRN), massage, and wear of counter force brace and reiterated use of wrist immobilizer as well.  Previously had educated on light ROM exercises.  Patient agrees to discharge. Patient goals were not met. Patient is being discharged due to not returning since the last visit.Marland Kitchen  Rosalio Loud, OT 07/20/23

## 2023-04-22 ENCOUNTER — Other Ambulatory Visit: Payer: Self-pay | Admitting: Adult Health Nurse Practitioner

## 2023-04-22 DIAGNOSIS — Z1231 Encounter for screening mammogram for malignant neoplasm of breast: Secondary | ICD-10-CM

## 2023-04-28 ENCOUNTER — Ambulatory Visit: Payer: BC Managed Care – PPO | Admitting: Occupational Therapy

## 2023-04-29 ENCOUNTER — Ambulatory Visit
Admission: RE | Admit: 2023-04-29 | Discharge: 2023-04-29 | Disposition: A | Discharge status: Home / Self Care | Payer: BC Managed Care – PPO | Source: Ambulatory Visit

## 2023-04-29 DIAGNOSIS — Z1231 Encounter for screening mammogram for malignant neoplasm of breast: Secondary | ICD-10-CM

## 2023-06-17 ENCOUNTER — Other Ambulatory Visit: Payer: Self-pay | Admitting: Medical Genetics

## 2023-06-17 DIAGNOSIS — Z006 Encounter for examination for normal comparison and control in clinical research program: Secondary | ICD-10-CM
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
## 2023-07-13 ENCOUNTER — Ambulatory Visit (INDEPENDENT_AMBULATORY_CARE_PROVIDER_SITE_OTHER): Payer: BC Managed Care – PPO | Admitting: Internal Medicine

## 2023-07-13 ENCOUNTER — Encounter: Payer: Self-pay | Admitting: Internal Medicine

## 2023-07-13 VITALS — BP 122/75 | HR 86 | Ht 62.0 in | Wt 178.6 lb

## 2023-07-13 DIAGNOSIS — J4520 Mild intermittent asthma, uncomplicated: Secondary | ICD-10-CM

## 2023-07-13 DIAGNOSIS — J3089 Other allergic rhinitis: Secondary | ICD-10-CM

## 2023-07-13 DIAGNOSIS — J302 Other seasonal allergic rhinitis: Secondary | ICD-10-CM

## 2023-07-13 NOTE — Patient Instructions (Signed)
Glad you are doing well.  We can refill your asthma meds when needed  Don't forget that flu shot

## 2023-08-04 ENCOUNTER — Other Ambulatory Visit (HOSPITAL_BASED_OUTPATIENT_CLINIC_OR_DEPARTMENT_OTHER): Payer: Self-pay

## 2023-08-04 ENCOUNTER — Emergency Department (HOSPITAL_BASED_OUTPATIENT_CLINIC_OR_DEPARTMENT_OTHER): Payer: BC Managed Care – PPO | Admitting: Radiology

## 2023-08-04 ENCOUNTER — Encounter (HOSPITAL_BASED_OUTPATIENT_CLINIC_OR_DEPARTMENT_OTHER): Payer: Self-pay

## 2023-08-04 ENCOUNTER — Other Ambulatory Visit: Payer: Self-pay

## 2023-08-04 ENCOUNTER — Emergency Department (HOSPITAL_BASED_OUTPATIENT_CLINIC_OR_DEPARTMENT_OTHER)
Admission: EM | Admit: 2023-08-04 | Discharge: 2023-08-04 | Disposition: A | Discharge status: Home / Self Care | Payer: BC Managed Care – PPO | Attending: Emergency Medicine | Admitting: Emergency Medicine

## 2023-08-04 DIAGNOSIS — J45909 Unspecified asthma, uncomplicated: Secondary | ICD-10-CM | POA: Diagnosis not present

## 2023-08-04 DIAGNOSIS — Z79899 Other long term (current) drug therapy: Secondary | ICD-10-CM | POA: Diagnosis not present

## 2023-08-04 DIAGNOSIS — E039 Hypothyroidism, unspecified: Secondary | ICD-10-CM | POA: Insufficient documentation

## 2023-08-04 DIAGNOSIS — M25512 Pain in left shoulder: Secondary | ICD-10-CM | POA: Insufficient documentation

## 2023-08-04 HISTORY — DX: Type 2 diabetes mellitus without complications: E11.9

## 2023-08-04 MED ORDER — KETOROLAC TROMETHAMINE 30 MG/ML IJ SOLN
30.0000 mg | Freq: Once | INTRAMUSCULAR | Status: AC
  Administered 2023-08-04: 30 mg via INTRAMUSCULAR
  Filled 2023-08-04: qty 1

## 2023-08-04 MED ORDER — CYCLOBENZAPRINE HCL 10 MG PO TABS
10.0000 mg | ORAL_TABLET | Freq: Two times a day (BID) | ORAL | 0 refills | Status: DC | PRN
  Filled 2023-08-04: qty 20, 10d supply, fill #0

## 2023-08-04 MED ORDER — LIDOCAINE 5 % EX PTCH
1.0000 | MEDICATED_PATCH | CUTANEOUS | 0 refills | Status: DC
  Filled 2023-08-04: qty 30, 30d supply, fill #0

## 2023-08-04 MED ORDER — LIDOCAINE 5 % EX PTCH
1.0000 | MEDICATED_PATCH | CUTANEOUS | Status: DC
  Administered 2023-08-04: 1 via TRANSDERMAL
  Filled 2023-08-04: qty 1

## 2023-08-04 NOTE — Discharge Instructions (Addendum)
As discussed, x-ray negative for fracture or dislocation.  Suspect your injury is more likely muscular in nature.  Will recommend use of flexeril in the outpatient; this medication can cause drowsiness so please do not driver or perform any high risk activity until you realized this medications effects on you. Will also send you home with the numbing patches.  Continue to perform range of motion exercises on your left shoulder to decrease likelihood of development of frozen shoulder.  Recommend follow-up with PCP for reassessment of your symptoms.  Please do not hesitate to return to emergency department for worrisome signs and symptoms we discussed become apparent.
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
## 2023-08-04 NOTE — ED Triage Notes (Signed)
Monday afternoon walking dog and states arm got pulled by dog trying to run away.  Bruising noted to under arm.  Not moving  arm.  Upper arm appears swollen.  Present crying due to pain.

## 2023-08-04 NOTE — ED Notes (Signed)
Patient transported to X-ray 

## 2023-08-07 ENCOUNTER — Other Ambulatory Visit (HOSPITAL_BASED_OUTPATIENT_CLINIC_OR_DEPARTMENT_OTHER): Payer: Self-pay

## 2023-08-14 ENCOUNTER — Encounter: Payer: Self-pay | Admitting: Internal Medicine

## 2023-08-14 NOTE — Assessment & Plan Note (Signed)
Not a current concern. Does have antihistamine if needed.

## 2023-08-14 NOTE — Assessment & Plan Note (Signed)
Mild and uncomplicated now. Has learned triggers to avoid- esp house dust. Plan- keep meds available as discussed

## 2023-09-28 ENCOUNTER — Emergency Department (HOSPITAL_BASED_OUTPATIENT_CLINIC_OR_DEPARTMENT_OTHER): Payer: 59

## 2023-09-28 ENCOUNTER — Other Ambulatory Visit: Payer: Self-pay

## 2023-09-28 ENCOUNTER — Emergency Department (HOSPITAL_BASED_OUTPATIENT_CLINIC_OR_DEPARTMENT_OTHER)
Admission: EM | Admit: 2023-09-28 | Discharge: 2023-09-28 | Disposition: A | Discharge status: Home / Self Care | Payer: 59 | Attending: Emergency Medicine | Admitting: Emergency Medicine

## 2023-09-28 DIAGNOSIS — Z7984 Long term (current) use of oral hypoglycemic drugs: Secondary | ICD-10-CM | POA: Insufficient documentation

## 2023-09-28 DIAGNOSIS — L03115 Cellulitis of right lower limb: Secondary | ICD-10-CM | POA: Insufficient documentation

## 2023-09-28 DIAGNOSIS — M069 Rheumatoid arthritis, unspecified: Secondary | ICD-10-CM | POA: Insufficient documentation

## 2023-09-28 DIAGNOSIS — R0602 Shortness of breath: Secondary | ICD-10-CM | POA: Diagnosis not present

## 2023-09-28 DIAGNOSIS — Z20822 Contact with and (suspected) exposure to covid-19: Secondary | ICD-10-CM | POA: Insufficient documentation

## 2023-09-28 DIAGNOSIS — E1165 Type 2 diabetes mellitus with hyperglycemia: Secondary | ICD-10-CM | POA: Insufficient documentation

## 2023-09-28 DIAGNOSIS — M79604 Pain in right leg: Secondary | ICD-10-CM | POA: Diagnosis present

## 2023-09-28 DIAGNOSIS — R000 Tachycardia, unspecified: Secondary | ICD-10-CM | POA: Insufficient documentation

## 2023-09-28 LAB — CBC WITH DIFFERENTIAL/PLATELET
Abs Immature Granulocytes: 0.01 10*3/uL (ref 0.00–0.07)
Basophils Absolute: 0 10*3/uL (ref 0.0–0.1)
Basophils Relative: 0 %
Eosinophils Absolute: 0.1 10*3/uL (ref 0.0–0.5)
Eosinophils Relative: 1 %
HCT: 36.2 % (ref 36.0–46.0)
Hemoglobin: 13 g/dL (ref 12.0–15.0)
Immature Granulocytes: 0 %
Lymphocytes Relative: 13 %
Lymphs Abs: 0.6 10*3/uL — ABNORMAL LOW (ref 0.7–4.0)
MCH: 32.4 pg (ref 26.0–34.0)
MCHC: 35.9 g/dL (ref 30.0–36.0)
MCV: 90.3 fL (ref 80.0–100.0)
Monocytes Absolute: 0.4 10*3/uL (ref 0.1–1.0)
Monocytes Relative: 9 %
Neutro Abs: 3.5 10*3/uL (ref 1.7–7.7)
Neutrophils Relative %: 77 %
Platelets: 143 10*3/uL — ABNORMAL LOW (ref 150–400)
RBC: 4.01 MIL/uL (ref 3.87–5.11)
RDW: 13 % (ref 11.5–15.5)
WBC: 4.6 10*3/uL (ref 4.0–10.5)
nRBC: 0 % (ref 0.0–0.2)

## 2023-09-28 LAB — CK: Total CK: 45 U/L (ref 38–234)

## 2023-09-28 LAB — PROTIME-INR
INR: 1 (ref 0.8–1.2)
Prothrombin Time: 13.1 s (ref 11.4–15.2)

## 2023-09-28 LAB — RESP PANEL BY RT-PCR (RSV, FLU A&B, COVID)  RVPGX2
Influenza A by PCR: NEGATIVE
Influenza B by PCR: NEGATIVE
Resp Syncytial Virus by PCR: NEGATIVE
SARS Coronavirus 2 by RT PCR: NEGATIVE

## 2023-09-28 LAB — COMPREHENSIVE METABOLIC PANEL
ALT: 21 U/L (ref 0–44)
AST: 21 U/L (ref 15–41)
Albumin: 4.8 g/dL (ref 3.5–5.0)
Alkaline Phosphatase: 58 U/L (ref 38–126)
Anion gap: 7 (ref 5–15)
BUN: 15 mg/dL (ref 6–20)
CO2: 28 mmol/L (ref 22–32)
Calcium: 9.6 mg/dL (ref 8.9–10.3)
Chloride: 104 mmol/L (ref 98–111)
Creatinine, Ser: 0.58 mg/dL (ref 0.44–1.00)
GFR, Estimated: >60 mL/min (ref >60)
Glucose, Bld: 120 mg/dL — ABNORMAL HIGH (ref 70–99)
Potassium: 3.7 mmol/L (ref 3.5–5.1)
Sodium: 139 mmol/L (ref 135–145)
Total Bilirubin: 1.3 mg/dL — ABNORMAL HIGH (ref 0.0–1.2)
Total Protein: 7.1 g/dL (ref 6.5–8.1)

## 2023-09-28 LAB — TROPONIN I (HIGH SENSITIVITY)
Troponin I (High Sensitivity): 2 ng/L (ref <18)
Troponin I (High Sensitivity): <2 ng/L (ref <18)

## 2023-09-28 LAB — APTT: aPTT: 30 s (ref 24–36)

## 2023-09-28 LAB — LACTIC ACID, PLASMA
Lactic Acid, Venous: 1.2 mmol/L (ref 0.5–1.9)
Lactic Acid, Venous: 1.4 mmol/L (ref 0.5–1.9)

## 2023-09-28 MED ORDER — DOXYCYCLINE HYCLATE 100 MG PO CAPS: 100.0000 mg | ORAL_CAPSULE | Freq: Two times a day (BID) | ORAL | 0 refills | Status: DC

## 2023-09-28 MED ORDER — IOHEXOL 350 MG/ML SOLN
100.0000 mL | Freq: Once | INTRAVENOUS | Status: AC | PRN
  Administered 2023-09-28: 75 mL via INTRAVENOUS

## 2023-09-28 MED ORDER — METOCLOPRAMIDE HCL 5 MG/ML IJ SOLN
5.0000 mg | Freq: Once | INTRAMUSCULAR | Status: AC
  Administered 2023-09-28: 5 mg via INTRAVENOUS
  Filled 2023-09-28: qty 2

## 2023-09-28 MED ORDER — ONDANSETRON HCL 4 MG/2ML IJ SOLN
4.0000 mg | Freq: Once | INTRAMUSCULAR | Status: AC
  Administered 2023-09-28: 4 mg via INTRAVENOUS
  Filled 2023-09-28: qty 2

## 2023-09-28 MED ORDER — MORPHINE SULFATE (PF) 4 MG/ML IV SOLN
4.0000 mg | Freq: Once | INTRAVENOUS | Status: AC
  Administered 2023-09-28: 4 mg via INTRAVENOUS
  Filled 2023-09-28: qty 1

## 2023-09-28 NOTE — Discharge Instructions (Addendum)
Contact a health care provider if: You have a fever. Your symptoms do not improve within 1-2 days of starting treatment or you develop new symptoms. Your bone or joint underneath the infected area becomes painful after the skin has healed. Your infection returns in the same area or another area. Signs of this may include: You notice a swollen bump in the infected area. Your red area gets larger, turns dark in color, or becomes more painful. Drainage increases. Pus or a bad smell develops in your infected area. You have more pain. You feel ill and have muscle aches and weakness. You develop vomiting or diarrhea that will not go away. Get help right away if: You notice red streaks coming from the infected area. You notice the skin turns purple or black and falls off. This symptom may be an emergency. Get help right away. Call 911. Do not wait to see if the symptom will go away. Do not drive yourself to the hospital. This information is not intended to replace advice given to you by your health care provider. Make sure you discuss any questions you have with your health care provide

## 2023-09-28 NOTE — ED Triage Notes (Signed)
Pt to ED from PCP concerned for blood clot. Pt to ED c/o right leg pain x 2 days, No known injury. Reports unable to put weight on it. Leg discoloration noted, not warm to touch. Reports SHOB

## 2023-09-28 NOTE — ED Provider Notes (Signed)
[place] EMERGENCY DEPARTMENT AT [facility] Provider Note   CSN: [id number] Arrival date & time: 09/28/23  1304     History  Chief Complaint  Patient presents with   Leg Pain    Right   Shortness of Breath    Destiny Sharp is a 53 y.o. female who presents with fever and leg pain. The patien t has a pmh of DM2, RA, anxiety, NASH. 2 days ago she had sudden onset fatigue and R ankle pain. At 11:30 am. She rested and put her leg up and then began having shaking chills followed by a fever of 101.7. She had flu like sxs and fever with leg pain all day yesterday. She had defervescence with tylenol. Today sxs continued and she noticed swelling and discoloration of the RLE with cp and sob.    Leg Pain Shortness of Breath      Home Medications Prior to Admission medications   Medication Sig Start Date End Date Taking? Authorizing Provider  doxycycline (VIBRAMYCIN) 100 MG capsule Take 1 capsule (100 mg total) by mouth 2 (two) times daily. 09/28/23  Yes Arthor Captain, PA-C  Accu-Chek Softclix Lancets lancets  10/20/19   [provider]  albuterol (PROVENTIL HFA;VENTOLIN HFA) 108 (90 Base) MCG/ACT inhaler Inhale 2 puffs into the lungs every 4 (four) hours as needed for wheezing or shortness of breath. Patient not taking: Reported on 07/13/2023 12/06/17   Waymon Budge, MD  Blood Glucose Monitoring Suppl DEVI Use as directed once daily. Please dispense meter best covered by insurance. 10/18/19   [provider]  Cholecalciferol (VITAMIN D-3 PO) Take by mouth.    [provider]  cyclobenzaprine (FLEXERIL) 10 MG tablet Take 1 tablet (10 mg total) by mouth 2 (two) times daily as needed for muscle spasms. 08/04/23   Sherian Maroon A, PA  DENTA 5000 PLUS 1.1 % CREA dental cream Place 1 application onto teeth at bedtime as needed (dental care).  05/03/14   [provider]  diclofenac Sodium (VOLTAREN) 1 % GEL Apply 4 g topically 4 (four) times  daily. 03/22/22   Gailen Shelter, PA  folic acid (FOLVITE) 1 MG tablet Take 1 mg by mouth daily. 06/17/23   [provider]  Glucose Blood (BLOOD GLUCOSE TEST STRIPS) STRP Use as instructed once daily. 10/18/19   [provider]  hydroxychloroquine (PLAQUENIL) 200 MG tablet Take 200 mg by mouth in the morning and at bedtime.    [provider]  levothyroxine (SYNTHROID, LEVOTHROID) 50 MCG tablet Take 50 mcg by mouth daily before breakfast.     [provider]  lidocaine (LIDODERM) 5 % Place 1 patch onto the skin daily. Remove & Discard patch within 12 hours or as directed by MD 08/04/23   Peter Garter, PA  meloxicam (MOBIC) 15 MG tablet Take 15 mg by mouth daily.    [provider]  metFORMIN (GLUCOPHAGE) 500 MG tablet Take 1 tablet by mouth in the morning and at bedtime. 06/19/20   [provider]  methotrexate 50 MG/2ML injection Inject 0.4 mLs into the skin once a week.    [provider]  mirabegron ER (MYRBETRIQ) 50 MG TB24 tablet Take 50 mg by mouth daily.    [provider]  Multiple Vitamin (MULTIVITAMIN WITH MINERALS) TABS Take 1 tablet by mouth every morning.     [provider]  Nebulizers (COMPRESSOR/NEBULIZER) MISC Use as directed for asthma 02/03/13   Waymon Budge, MD  nitroGLYCERIN (NITRODUR - DOSED IN MG/24 HR) 0.2 mg/hr patch Place onto the skin. 07/15/23   [provider]  Omega-3 Fatty Acids (FISH OIL PO) Take 2,400 mg by mouth daily.    [provider]  OVER THE COUNTER MEDICATION Place 1 application onto the skin as needed. Essential oil therapy Bennie Dallas, Stress Away    [provider]  Spacer/Aero-Holding Chambers (OPTICHAMBER DIAMOND) MISC  01/27/13   [provider]  Upadacitinib ER 15 MG TB24 Take 1 tablet by mouth daily.    [provider]      Allergies    Pneumococcal vaccine, Cephalexin, Codeine, Hepatitis b vaccine recomb  (3-antigen), Hepatitis b virus vaccines, Imitrex [sumatriptan], and Pneumococcal vaccines    Review of Systems   Review of Systems  Respiratory:  Positive for shortness of breath.     Physical Exam Updated Vital Signs BP (!) 112/58   Pulse 97   Temp 98.2 F (36.8 C)   Resp 18   Ht 5\' 2"  (1.575 m)   Wt 77.1 kg   LMP 05/22/2014 (Exact Date)   SpO2 96%   BMI 31.09 kg/m  Physical Exam Vitals and nursing note reviewed.  Constitutional:      General: She is not in acute distress.    Appearance: She is well-developed. She is not diaphoretic.  HENT:     Head: Normocephalic and atraumatic.     Right Ear: External ear normal.     Left Ear: External ear normal.     Nose: Nose normal.     Mouth/Throat:     Mouth: Mucous membranes are moist.  Eyes:     General: No scleral icterus.    Conjunctiva/sclera: Conjunctivae normal.  Cardiovascular:     Rate and Rhythm: Normal rate and regular rhythm.     Heart sounds: Normal heart sounds. No murmur heard.    No friction rub. No gallop.  Pulmonary:     Effort: Pulmonary effort is normal. No respiratory distress.     Breath sounds: Normal breath sounds.  Abdominal:     General: Bowel sounds are normal. There is no distension.     Palpations: Abdomen is soft. There is no mass.     Tenderness: There is no abdominal tenderness. There is no guarding.  Musculoskeletal:     Cervical back: Normal range of motion.     Comments: Edema and Unilateral swelling of the R foot and calf up to the knee. No pitting, Eryhthema, warmth and petechiae noted over the R dorsal foot. The leg is exquisitely ttp. Compartments are soft. Normal pulse. 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  Skin:    General: Skin is warm and dry.  Neurological:     Mental Status: She is alert and oriented to person, place, and time.  Psychiatric:        Behavior: Behavior normal.     ED Results / Procedures / Treatments   Labs (all labs  ordered are listed, but only abnormal results are displayed) Labs Reviewed  COMPREHENSIVE METABOLIC PANEL - Abnormal; Notable for the following components:      Result Value   Glucose, Bld 120 (*)    Total Bilirubin 1.3 (*)    All other components within normal limits  CBC WITH DIFFERENTIAL/PLATELET - Abnormal; Notable for the following components:   Platelets 143 (*)    Lymphs Abs 0.6 (*)    All other components within normal limits  RESP PANEL BY  RT-PCR (RSV, FLU A&B, COVID)  RVPGX2  CULTURE, BLOOD (ROUTINE X 2)  CULTURE, BLOOD (ROUTINE X 2)  LACTIC ACID, PLASMA  LACTIC ACID, PLASMA  PROTIME-INR  APTT  CK  TROPONIN I (HIGH SENSITIVITY)  TROPONIN I (HIGH SENSITIVITY)    EKG None  Radiology US Venous Img Lower Right (DVT Study) Result Date: 09/28/2023 CLINICAL DATA:  Right lower extremity edema. EXAM: RIGHT LOWER EXTREMITY VENOUS DOPPLER ULTRASOUND TECHNIQUE: Gray-scale sonography with compression, as well as color and duplex ultrasound, were performed to evaluate the deep venous system(s) from the level of the common femoral vein through the popliteal and proximal calf veins. COMPARISON:  None Available. FINDINGS: VENOUS Normal compressibility of the common femoral, superficial femoral, and popliteal veins, as well as the visualized calf veins. Visualized portions of profunda femoral vein and great saphenous vein unremarkable. No filling defects to suggest DVT on grayscale or color Doppler imaging. Doppler waveforms show normal direction of venous flow, normal respiratory plasticity and response to augmentation. Limited views of the contralateral common femoral vein are unremarkable. OTHER None. Limitations: none IMPRESSION: Negative. Electronically Signed   By: Aram Candela M.D.   On: 09/28/2023 17:36   CT Angio Chest PE W and/or Wo Contrast Result Date: 09/28/2023 CLINICAL DATA:  Right leg pain x2 days, shortness of breath EXAM: CT ANGIOGRAPHY CHEST WITH CONTRAST TECHNIQUE:  Multidetector CT imaging of the chest was performed using the standard protocol during bolus administration of intravenous contrast. Multiplanar CT image reconstructions and MIPs were obtained to evaluate the vascular anatomy. RADIATION DOSE REDUCTION: This exam was performed according to the departmental dose-optimization program which includes automated exposure control, adjustment of the mA and/or kV according to patient size and/or use of iterative reconstruction technique. CONTRAST:  75mL OMNIPAQUE IOHEXOL 350 MG/ML SOLN COMPARISON:  01/17/2013 FINDINGS: Cardiovascular: Heart size normal. No pericardial effusion. Satisfactory opacification of pulmonary arteries noted, and there is no evidence of pulmonary emboli. Adequate contrast opacification of the thoracic aorta with no evidence of dissection, aneurysm, or stenosis. There is classic 3-vessel brachiocephalic arch anatomy without proximal stenosis. Mediastinum/Nodes: No mass, hematoma, or adenopathy. Lungs/Pleura: No pleural effusion. No pneumothorax. Mild dependent atelectasis in the lung bases. Lungs otherwise clear. Upper Abdomen: No acute findings. Musculoskeletal: No chest wall abnormality. No acute or significant osseous findings. Review of the MIP images confirms the above findings. IMPRESSION: 1. Negative.  No acute PE. Electronically Signed   By: Corlis Leak M.D.   On: 09/28/2023 16:42   DG Chest Port 1 View Result Date: 09/28/2023 CLINICAL DATA:  Right leg pain, possible sepsis EXAM: PORTABLE CHEST - 1 VIEW COMPARISON:  09/08/2016 FINDINGS: Lungs are clear. Heart size and mediastinal contours are within normal limits. Aortic Atherosclerosis (ICD10-170.0). No effusion. Visualized bones unremarkable. IMPRESSION: No acute cardiopulmonary disease. Electronically Signed   By: Corlis Leak M.D.   On: 09/28/2023 15:27    Procedures Procedures    Medications Ordered in ED Medications  morphine (PF) 4 MG/ML injection 4 mg (4 mg Intravenous Given  09/28/23 1517)  ondansetron (ZOFRAN) injection 4 mg (4 mg Intravenous Given 09/28/23 1517)  iohexol (OMNIPAQUE) 350 MG/ML injection 100 mL (75 mLs Intravenous Contrast Given 09/28/23 1621)  metoCLOPramide (REGLAN) injection 5 mg (5 mg Intravenous Given 09/28/23 1655)    ED Course/ Medical Decision Making/ A&P                                 Medical  Decision Making Amount and/or Complexity of Data Reviewed Labs: ordered. Radiology: ordered.  Risk Prescription drug management.   This patient presents to the ED with chief complaint(s) of leg pain and fever with pertinent past medical history of RA and DM which further complicates the presenting complaint. The complaint involves an extensive differential diagnosis and treatment options and also carries with it a high risk of complications and morbidity.    The differential diagnosis includes ddx includes DVT, necrotizing Fasciitis, cellulitis, myositis. Less likely compartment syndrome.    The initial plan is to order lab work, imaging and to treat patient with pain meds for pain    Additional history obtained:  Records reviewed Care Everywhere/External Records  Reassessment and review (also see workup area): Lab Tests: I Ordered, and personally interpreted labs.  The pertinent results include:   Mildly elevated blood blood glucose of insignificant value in this diabetic patient, CBC without elevated white blood cell count mild thrombocytopenia of insignificant value respiratory panel negative remainder of labs are negative.  Imaging Studies: I ordered and independently visualized and interpreted the following imaging CT scan chest, X-ray chest , and Ultrasound right lower extremity   which showed no acute findings The interpretation of the imaging was limited to assessing for emergent pathology, for which purpose it was ordered.  Medicines ordered and prescription drug management: I ordered the following medications Rocephin pain  and nausea medicine for suspected cellulitis   Reevaluation of the patient after these medicines showed that the patient    improved   Cardiac Monitoring: The patient was maintained on a cardiac monitor.  I personally viewed and interpreted the cardiac monitor which showed an underlying rhythm of:  sinus rhythm  Complexity of problems addressed: Patient's presentation is most consistent with  acute illness/injury with systemic symptoms During patient's assessment  Disposition: After consideration of the diagnostic results and the patient's response to treatment,  I feel that the patient would benefit from discharge with treatment for cellulitis including doxycycline.  Offered pain medication however patient declined at discharge. .          Final Clinical Impression(s) / ED Diagnoses Final diagnoses:  Cellulitis of right lower extremity    Rx / DC Orders ED Discharge Orders          Ordered    doxycycline (VIBRAMYCIN) 100 MG capsule  2 times daily        09/28/23 1840              Arthor Captain, PA-C 09/28/23 1856    Rolan Bucco, MD 09/29/23 [phone]

## 2023-09-29 ENCOUNTER — Telehealth (HOSPITAL_BASED_OUTPATIENT_CLINIC_OR_DEPARTMENT_OTHER): Payer: Self-pay | Admitting: Emergency Medicine

## 2023-09-29 LAB — BLOOD CULTURE ID PANEL (REFLEXED) - BCID2

## 2023-10-01 ENCOUNTER — Telehealth: Payer: Self-pay | Admitting: Emergency Medicine

## 2023-10-01 NOTE — Telephone Encounter (Signed)
Post ED Visit - Positive Culture Follow-up  Culture report reviewed by antimicrobial stewardship pharmacist: [facility] []  Enzo Bi, Pharm.D. []  Celedonio Miyamoto, Pharm.D., BCPS AQ-ID []  Garvin Fila, Pharm.D., BCPS []  Georgina Pillion, Pharm.D., BCPS []  [employer], [place].D., BCPS, AAHIVP []  Estella Husk, Pharm.D., BCPS, AAHIVP []  Lysle Pearl, PharmD, BCPS []  Phillips Climes, PharmD, BCPS []  Agapito Games, PharmD, BCPS [x]  Ivery Quale, PharmD []  Mervyn Gay, PharmD, BCPS []  Vinnie Level, PharmD  [facility] []  Len Childs, PharmD []  Greer Pickerel, PharmD []  Adalberto Cole, PharmD []  Perlie Gold, Rph []  Lonell Face) Jean Rosenthal, PharmD []  Earl Many, PharmD []  Junita Push, PharmD []  Dorna Leitz, PharmD []  Terrilee Files, PharmD []  Lynann Beaver, PharmD []  Keturah Barre, PharmD []  Loralee Pacas, PharmD []  Bernadene Person, PharmD   Positive Blood culture Likely contaminate, no further patient follow-up is required at this time.  Tanna Savoy Judaea Burgoon 10/01/2023, 11:02 AM

## 2023-10-03 ENCOUNTER — Telehealth (HOSPITAL_BASED_OUTPATIENT_CLINIC_OR_DEPARTMENT_OTHER): Payer: Self-pay | Admitting: *Deleted

## 2023-10-03 LAB — CULTURE, BLOOD (ROUTINE X 2)
Culture: NO GROWTH
Special Requests: ADEQUATE

## 2023-10-03 NOTE — Telephone Encounter (Signed)
Post ED Visit - Positive Culture Follow-up  Culture report reviewed by antimicrobial stewardship pharmacist: [facility] []  Enzo Bi, Pharm.D. []  Celedonio Miyamoto, Pharm.D., BCPS AQ-ID []  Garvin Fila, Pharm.D., BCPS []  Georgina Pillion, Pharm.D., BCPS []  [place], [address].D., BCPS, AAHIVP []  Estella Husk, Pharm.D., BCPS, AAHIVP []  Lysle Pearl, PharmD, BCPS []  Phillips Climes, PharmD, BCPS []  Agapito Games, PharmD, BCPS []  Verlan Friends, PharmD []  Mervyn Gay, PharmD, BCPS [x]  Gillis Ends, PharmD  [facility] []  Len Childs, PharmD []  Greer Pickerel, PharmD []  Adalberto Cole, PharmD []  Perlie Gold, Rph []  Lonell Face) Jean Rosenthal, PharmD []  Earl Many, PharmD []  Junita Push, PharmD []  Dorna Leitz, PharmD []  Terrilee Files, PharmD []  Lynann Beaver, PharmD []  Keturah Barre, PharmD []  Loralee Pacas, PharmD []  Bernadene Person, PharmD   Positive blood culture Likely contaminate and no further patient follow-up is required at this time.  Destiny Sharp 10/03/2023, 8:25 AM

## 2023-10-04 LAB — CULTURE, BLOOD (ROUTINE X 2): Special Requests: ADEQUATE

## 2023-11-03 LAB — EXTERNAL GENERIC LAB PROCEDURE: COLOGUARD: NEGATIVE

## 2023-11-03 LAB — COLOGUARD: COLOGUARD: NEGATIVE

## 2024-04-28 ENCOUNTER — Encounter

## 2024-04-28 DIAGNOSIS — Z1231 Encounter for screening mammogram for malignant neoplasm of breast: Secondary | ICD-10-CM

## 2024-06-28 ENCOUNTER — Other Ambulatory Visit: Payer: Self-pay | Admitting: Medical Genetics

## 2024-06-28 DIAGNOSIS — Z006 Encounter for examination for normal comparison and control in clinical research program: Secondary | ICD-10-CM

## 2024-07-13 ENCOUNTER — Ambulatory Visit: Payer: BC Managed Care – PPO | Admitting: Internal Medicine

## 2024-07-27 ENCOUNTER — Other Ambulatory Visit

## 2024-07-27 DIAGNOSIS — Z006 Encounter for examination for normal comparison and control in clinical research program: Secondary | ICD-10-CM

## 2024-07-28 ENCOUNTER — Other Ambulatory Visit: Payer: Self-pay | Admitting: Adult Health Nurse Practitioner

## 2024-07-28 DIAGNOSIS — Z1231 Encounter for screening mammogram for malignant neoplasm of breast: Secondary | ICD-10-CM

## 2024-08-07 LAB — GENECONNECT MOLECULAR SCREEN: Genetic Analysis Overall Interpretation: NEGATIVE

## 2024-08-14 ENCOUNTER — Telehealth: Payer: Self-pay | Admitting: *Deleted

## 2024-08-14 NOTE — Telephone Encounter (Signed)
 Dr. Olena ok to take this patient on of Dr. Saundra? Pt has scheduled appt with Dr. Olena 12/23. Copied from CRM [id number]. Topic: Appointments - Transfer of Care >> Aug 11, 2024  5:03 PM [name] wrote: Pt is requesting to transfer FROM: Destiny Sharp Pt is requesting to transfer TO: [name] Reason for requested transfer: Dr Neysa retiring It is the responsibility of the team the patient would like to transfer to (Dr. Olena) to reach out to the patient if for any reason this transfer is not acceptable.

## 2024-08-22 ENCOUNTER — Inpatient Hospital Stay: Admission: RE | Admit: 2024-08-22 | Discharge: 2024-08-22

## 2024-08-22 DIAGNOSIS — Z1231 Encounter for screening mammogram for malignant neoplasm of breast: Secondary | ICD-10-CM

## 2024-08-29 ENCOUNTER — Ambulatory Visit: Admitting: Pulmonary Disease

## 2024-08-29 ENCOUNTER — Encounter: Payer: Self-pay | Admitting: Pulmonary Disease

## 2024-08-29 VITALS — BP 112/62 | HR 84 | Temp 98.4°F | Ht 62.0 in | Wt 175.4 lb

## 2024-08-29 DIAGNOSIS — J4550 Severe persistent asthma, uncomplicated: Secondary | ICD-10-CM

## 2024-08-29 DIAGNOSIS — R0683 Snoring: Secondary | ICD-10-CM

## 2024-08-29 MED ORDER — IPRATROPIUM-ALBUTEROL 0.5-2.5 (3) MG/3ML IN SOLN: 3.0000 mL | Freq: Four times a day (QID) | RESPIRATORY_TRACT | 3 refills | Status: AC | PRN

## 2024-08-29 MED ORDER — FLUTICASONE FUROATE-VILANTEROL 200-25 MCG/ACT IN AEPB: 1.0000 | INHALATION_SPRAY | Freq: Every day | RESPIRATORY_TRACT | 3 refills | Status: DC

## 2024-08-29 MED ORDER — ALBUTEROL SULFATE HFA 108 (90 BASE) MCG/ACT IN AERS: 1.0000 | INHALATION_SPRAY | Freq: Four times a day (QID) | RESPIRATORY_TRACT | 11 refills | Status: AC | PRN

## 2024-08-29 NOTE — Patient Instructions (Addendum)
" °  I have recommended changes to your inhaler regimen today. These changes will be outlined in my clinical note which you can review on MyChart (this note will be available by tomorrow, if not sooner).  ----------   Please schedule a follow up appointment with me when you check out today.  ----------   You will receive a call to schedule your Sleep Study.  ----------   "

## 2024-08-29 NOTE — Progress Notes (Signed)
 "  Established Patient Pulmonology Office Visit   Subjective:  Patient ID: Destiny Sharp, female    DOB: May 22, 1971  MRN: [id number]  CC:  Chief Complaint  Patient presents with   Asthma   Obstructive Sleep Apnea    HPI 18 F never-smoker with history of asthma, restrictive PFTs, anxiety, hypertension, HFpEF, T2DM and RA who presents for follow upi. Patient previously seen by Dr. Neysa who is retiring.  When last seen in 07/2023, she felt her breathing and asthma control had been good and had not used any inhaler in several months. She did still have prescriptions for Breo 100, Ventolin  HFA, and DuoNebs at that time.  On Dec 3 she went to dentist and had asthma attack during/after the visit. 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  She reports she only has an albuterol  inhaler at present and has been using it ~4 days per week but endorses that she probably should be using it more.  She describes daily asthma symptoms to me that are significant. Her ACT score is 10 indicating very poor control of asthma symptoms.  She endorses needing to sleep sitting upright in a recliner. She has not been on treatment for OSA on the basis of a negative 2023 HST, however given her worsening symptoms of sleep apnea she feels she needs to be re-evaluated and likely restart CPAP therapy.  Patient endorses significant financial concerns lately and indicates she would prefer this be taken into account in terms of ordering of studies/evaluation and the costs that might be incurred as a result.   Pulm Questionnaires:  Asthma Control Test ACT Total Score  08/29/2024  9:50 AM 10  07/13/2023  9:47 AM 25  10/28/2020  9:51 AM 25      Current Medications[1]      Objective:  BP 112/62   Pulse 84   Temp 98.4 F (36.9 C) (Oral)   Ht 5' 2 (1.575 m)   Wt 175 lb  6.4 oz (79.6 kg)   LMP 05/22/2014   SpO2 100%   BMI 32.08 kg/m    Physical Exam Constitutional:      Appearance: Normal appearance. She is obese.  HENT:     Head: Normocephalic and atraumatic.     Mouth/Throat:     Mouth: Mucous membranes are moist.     Pharynx: Oropharynx is clear. No oropharyngeal exudate or posterior oropharyngeal erythema.  Eyes:     General: No scleral icterus.    Conjunctiva/sclera: Conjunctivae normal.  Cardiovascular:     Rate and Rhythm: Normal rate and regular rhythm.     Heart sounds: Normal heart sounds. No murmur heard.    No friction rub. No gallop.  Pulmonary:     Effort: Pulmonary effort is normal. No respiratory distress.     Breath sounds: Normal breath sounds. No stridor. No wheezing, rhonchi or rales.     Comments: Speaking in full and unlabored sentences. Despite ACT and patient reported symptoms, there is good air movement without any wheezing appreciated on my exam. Patient denies having taken her albuterol  inhaler so far today. Musculoskeletal:     Right lower leg: No edema.     Left lower leg: No edema.  Neurological:     Mental Status: She is alert and oriented to person, place, and time.  Psychiatric:  Thought Content: Thought content normal.      Diagnostic Review:  RAR:[facility] metabolic panel Lab Results  Component Value Date   WBC 4.6 09/28/2023   HGB 13.0 09/28/2023   HCT 36.2 09/28/2023   MCV 90.3 09/28/2023   MCH 32.4 09/28/2023   RDW 13.0 09/28/2023   PLT 143 (L) 09/28/2023   Metabolic Panel : Last metabolic panel Lab Results  Component Value Date   GLUCOSE 120 (H) 09/28/2023   NA 139 09/28/2023   K 3.7 09/28/2023   CL 104 09/28/2023   CO2 28 09/28/2023   BUN 15 09/28/2023   CREATININE 0.58 09/28/2023   GFRNONAA >60 09/28/2023   CALCIUM 9.6 09/28/2023   PROT 7.1 09/28/2023   ALBUMIN 4.8 09/28/2023   BILITOT 1.3 (H) 09/28/2023   ALKPHOS 58 09/28/2023   AST 21 09/28/2023   ALT 21 09/28/2023    ANIONGAP 7 09/28/2023   No results found for: BNP  Pulmonary Functions Testing Results:  02/03/2013: FVC 2.13 (60%), FEV1 1.93 (68%), FEV1/FVC 0.91, TLC 2.37 (49%), DLCO did not meet ATS criteria for interpretation however was reported as 62% (uncorr).  01/28/2013: Methacholine  challenge positive for hyperreactive airways at stage I.0.  02/03/2013 : 98%, 99%, 99%, 362m.   Sleep Studies:  HST (06/10/2022): AHI3% < 5 /hr.   Imaging:  CTA Chest (09/28/2023): Normal appearance of lungs. No PE.   Echocardiograms:  TTE (10/17/2012): Grade 2 diastolic dysfunction. PASP not elevated.     Assessment & Plan:   Assessment & Plan Severe persistent asthma without complication (HCC) Worsening asthma control appears to be due to medication non-adherence as was explained to the patient today.  Rx medium-to-high dose ICS-LABA (Breo 200-25) + SABA PRN. Emphasized critical importance of the maintenance inhaler (Breo).  Patient also requested an Rx for DuoNebs which she tends to use more when symptoms worsen. This was sent to her pharmacy as well.  Anticipate being able to reduce steroid dose of Breo back to 100-25 dose once asthma symptoms are stabilized, however given the patient's significant symptom burden at present and the prolonged duration they have been off all treatment I have opted to start her at the higher ICS dosage.  We discussed the role of PFTs in management of asthma and that these would ideally be ordered at this point in time. Using shared decision making, and factoring in the financial stress that the patient is under, we agreed to defer repeating PFTs at this time. Ideally we will be able to obtain these at a future visit. Will continue to re-evaluate.  Orders:   fluticasone  furoate-vilanterol (BREO ELLIPTA ) 200-25 MCG/ACT AEPB; Inhale 1 puff into the lungs daily.   albuterol  (VENTOLIN  HFA) 108 (90 Base) MCG/ACT inhaler; Inhale 1-2 puffs into the lungs every 6 (six)  hours as needed for wheezing or shortness of breath.   ipratropium-albuterol  (DUONEB) 0.5-2.5 (3) MG/3ML SOLN; Take 3 mLs by nebulization every 6 (six) hours as needed (wheezing or shortness of breath).  Snoring Previously diagnosed with OSA. Will re-test via HST and reinitiate CPAP therapy if testing is positive. Explained bidirectional relationship between control of asthma symptoms and control of OSA symptoms.  Orders:   Home sleep test; Future    Return in about 3 months (around 11/27/2024).  (earlier if inadequate symptom relief)   Time spent on day of this encounter (includes time spent face-to-face with the patient as well as time spent the same day as the encounter reviewing existing data and notes, and/or documenting my  findings and the plan of care): 41 minutes   Lamar JINNY Dales, MD      [1]  Current Outpatient Medications:    Accu-Chek Softclix Lancets lancets, , Disp: , Rfl:    azelastine (ASTELIN) 0.1 % nasal spray, Place 1 spray into the nose., Disp: , Rfl:    Blood Glucose Monitoring Suppl DEVI, Use as directed once daily. Please dispense meter best covered by insurance., Disp: , Rfl:    Cholecalciferol (VITAMIN D-3 PO), Take by mouth., Disp: , Rfl:    DENTA 5000 PLUS 1.1 % CREA dental cream, Place 1 application onto teeth at bedtime as needed (dental care). , Disp: , Rfl:    diclofenac  Sodium (VOLTAREN ) 1 % GEL, Apply 4 g topically 4 (four) times daily., Disp: 50 g, Rfl: 0   fluticasone  furoate-vilanterol (BREO ELLIPTA ) 200-25 MCG/ACT AEPB, Inhale 1 puff into the lungs daily., Disp: 90 each, Rfl: 3   Glucose Blood (BLOOD GLUCOSE TEST STRIPS) STRP, Use as instructed once daily., Disp: , Rfl:    hydroxychloroquine (PLAQUENIL) 200 MG tablet, Take 200 mg by mouth in the morning and at bedtime., Disp: , Rfl:    ipratropium-albuterol  (DUONEB) 0.5-2.5 (3) MG/3ML SOLN, Take 3 mLs by nebulization every 6 (six) hours as needed (wheezing or shortness of breath)., Disp: 360 mL,  Rfl: 3   levothyroxine  (SYNTHROID , LEVOTHROID) 50 MCG tablet, Take 50 mcg by mouth daily before breakfast. , Disp: , Rfl:    metFORMIN (GLUCOPHAGE) 500 MG tablet, Take 1 tablet by mouth in the morning and at bedtime., Disp: , Rfl:    mirabegron ER (MYRBETRIQ) 50 MG TB24 tablet, Take 50 mg by mouth daily., Disp: , Rfl:    Multiple Vitamin (MULTIVITAMIN WITH MINERALS) TABS, Take 1 tablet by mouth every morning. , Disp: , Rfl:    Nebulizers (COMPRESSOR/NEBULIZER) MISC, Use as directed for asthma, Disp: 1 each, Rfl: prn   Omega-3 Fatty Acids (FISH OIL PO), Take 2,400 mg by mouth daily., Disp: , Rfl:    OVER THE COUNTER MEDICATION, Place 1 application onto the skin as needed. Essential oil therapy Rosemary, Eucaliptus, Stress Away, Disp: , Rfl:    Spacer/Aero-Holding Chambers (OPTICHAMBER DIAMOND) MISC, , Disp: , Rfl:    Upadacitinib ER (RINVOQ) 15 MG TB24, Take 15 mg by mouth daily., Disp: , Rfl:    albuterol  (VENTOLIN  HFA) 108 (90 Base) MCG/ACT inhaler, Inhale 1-2 puffs into the lungs every 6 (six) hours as needed for wheezing or shortness of breath., Disp: 17 g, Rfl: 11  "

## 2024-10-13 ENCOUNTER — Other Ambulatory Visit: Payer: Self-pay | Admitting: Pulmonary Disease

## 2024-10-13 ENCOUNTER — Telehealth: Payer: Self-pay | Admitting: Pulmonary Disease

## 2024-10-13 DIAGNOSIS — G4733 Obstructive sleep apnea (adult) (pediatric): Secondary | ICD-10-CM

## 2024-10-13 MED ORDER — FLUTICASONE FUROATE-VILANTEROL 100-25 MCG/ACT IN AEPB: 1.0000 | INHALATION_SPRAY | Freq: Every day | RESPIRATORY_TRACT | 3 refills | Status: AC

## 2024-10-13 NOTE — Progress Notes (Signed)
 Sent Breo 100-25 to [facility]

## 2024-10-13 NOTE — Addendum Note (Signed)
 Addended byBETHA JESSICA BOUCHARD S on: 10/13/2024 01:23 PM   Modules accepted: Orders

## 2024-10-13 NOTE — Telephone Encounter (Signed)
 Please inform patient their HST shows moderate sleep apnea. Recommend CPAP 5-15 cm H2O with P-10 nasal pillows mask + chin strap. Needs a follow up appt with me in 3 months.

## 2024-10-13 NOTE — Telephone Encounter (Signed)
 Called and spoke to pt. Relayed message from Dr. Olena. Pt verbalized understanding, CPAP order placed. NFN.

## 2024-10-13 NOTE — Telephone Encounter (Signed)
 Called and spoke to pt. Pt would like to proceed with CPAP therapy (preferred DME is [facility] in [place]), however she is requesting Airfit F20 Mask rather than P-10 nasal pillows. Pt also states that her Breo is too strong and would like either a lower dose or something less strong.   Dr. Olena, please advise if okay to order CPAP with pt's preferred mask and inhaler request. Thank you!

## 2024-11-28 ENCOUNTER — Ambulatory Visit: Admitting: Pulmonary Disease

## 2025-01-25 ENCOUNTER — Ambulatory Visit: Admitting: Pulmonary Disease
